# Patient Record
Sex: Male | Born: 1935 | Race: White | Hispanic: No | Smoking: Former smoker
Health system: Southern US, Community
[De-identification: ages and names within clinical notes are randomized; demographics above are authoritative.]

## PROBLEM LIST (undated history)

## (undated) DIAGNOSIS — I10 Essential (primary) hypertension: Secondary | ICD-10-CM

## (undated) DIAGNOSIS — I4891 Unspecified atrial fibrillation: Secondary | ICD-10-CM

## (undated) DIAGNOSIS — I5022 Chronic systolic (congestive) heart failure: Secondary | ICD-10-CM

## (undated) DIAGNOSIS — C760 Malignant neoplasm of head, face and neck: Secondary | ICD-10-CM

## (undated) DIAGNOSIS — E785 Hyperlipidemia, unspecified: Secondary | ICD-10-CM

## (undated) DIAGNOSIS — I219 Acute myocardial infarction, unspecified: Secondary | ICD-10-CM

## (undated) DIAGNOSIS — R001 Bradycardia, unspecified: Secondary | ICD-10-CM

## (undated) HISTORY — PX: BACK SURGERY: SHX140

---

## 2007-04-12 ENCOUNTER — Other Ambulatory Visit: Payer: Self-pay

## 2007-04-13 ENCOUNTER — Inpatient Hospital Stay: Payer: Self-pay | Admitting: Internal Medicine

## 2007-08-01 ENCOUNTER — Ambulatory Visit: Payer: Self-pay | Admitting: Internal Medicine

## 2007-08-12 ENCOUNTER — Ambulatory Visit: Payer: Self-pay | Admitting: Internal Medicine

## 2007-08-31 ENCOUNTER — Ambulatory Visit: Payer: Self-pay | Admitting: Internal Medicine

## 2007-10-01 ENCOUNTER — Ambulatory Visit: Payer: Self-pay | Admitting: Internal Medicine

## 2007-11-01 ENCOUNTER — Ambulatory Visit: Payer: Self-pay | Admitting: Internal Medicine

## 2008-02-13 ENCOUNTER — Emergency Department: Payer: Self-pay | Admitting: Emergency Medicine

## 2008-02-13 ENCOUNTER — Other Ambulatory Visit: Payer: Self-pay

## 2008-05-03 ENCOUNTER — Other Ambulatory Visit: Payer: Self-pay

## 2008-05-04 ENCOUNTER — Inpatient Hospital Stay: Payer: Self-pay | Admitting: Psychiatry

## 2008-05-06 ENCOUNTER — Ambulatory Visit: Payer: Self-pay | Admitting: Unknown Physician Specialty

## 2008-05-22 ENCOUNTER — Inpatient Hospital Stay: Payer: Self-pay | Admitting: Unknown Physician Specialty

## 2008-05-22 ENCOUNTER — Other Ambulatory Visit: Payer: Self-pay

## 2008-05-31 ENCOUNTER — Ambulatory Visit: Payer: Self-pay | Admitting: Unknown Physician Specialty

## 2008-10-13 ENCOUNTER — Ambulatory Visit: Payer: Self-pay | Admitting: Internal Medicine

## 2009-03-27 ENCOUNTER — Emergency Department: Payer: Self-pay | Admitting: Emergency Medicine

## 2010-02-25 ENCOUNTER — Emergency Department: Payer: Self-pay | Admitting: Emergency Medicine

## 2014-02-24 DIAGNOSIS — J449 Chronic obstructive pulmonary disease, unspecified: Secondary | ICD-10-CM | POA: Insufficient documentation

## 2014-02-24 DIAGNOSIS — F0390 Unspecified dementia without behavioral disturbance: Secondary | ICD-10-CM | POA: Insufficient documentation

## 2014-02-24 DIAGNOSIS — G629 Polyneuropathy, unspecified: Secondary | ICD-10-CM | POA: Insufficient documentation

## 2014-02-24 DIAGNOSIS — E785 Hyperlipidemia, unspecified: Secondary | ICD-10-CM | POA: Insufficient documentation

## 2014-02-24 DIAGNOSIS — I10 Essential (primary) hypertension: Secondary | ICD-10-CM | POA: Insufficient documentation

## 2014-02-24 DIAGNOSIS — I429 Cardiomyopathy, unspecified: Secondary | ICD-10-CM | POA: Insufficient documentation

## 2015-02-19 ENCOUNTER — Inpatient Hospital Stay (HOSPITAL_COMMUNITY): Payer: Medicare PPO | Admitting: Anesthesiology

## 2015-02-19 ENCOUNTER — Encounter (HOSPITAL_COMMUNITY)
Admission: EM | Disposition: A | Discharge status: Rehabilitation Facility | Payer: Self-pay | Source: Home / Self Care | Attending: Neurology

## 2015-02-19 ENCOUNTER — Emergency Department (HOSPITAL_COMMUNITY): Payer: Medicare PPO

## 2015-02-19 ENCOUNTER — Encounter (HOSPITAL_COMMUNITY): Payer: Self-pay | Admitting: *Deleted

## 2015-02-19 ENCOUNTER — Inpatient Hospital Stay (HOSPITAL_COMMUNITY)
Admission: EM | Admit: 2015-02-19 | Discharge: 2015-02-24 | DRG: 023 | Disposition: A | Discharge status: Rehabilitation Facility | Payer: Medicare PPO | Attending: Neurology | Admitting: Neurology

## 2015-02-19 ENCOUNTER — Inpatient Hospital Stay (HOSPITAL_COMMUNITY): Payer: Medicare PPO

## 2015-02-19 DIAGNOSIS — R29810 Facial weakness: Secondary | ICD-10-CM | POA: Diagnosis present

## 2015-02-19 DIAGNOSIS — I63411 Cerebral infarction due to embolism of right middle cerebral artery: Secondary | ICD-10-CM | POA: Diagnosis present

## 2015-02-19 DIAGNOSIS — I10 Essential (primary) hypertension: Secondary | ICD-10-CM | POA: Diagnosis not present

## 2015-02-19 DIAGNOSIS — I639 Cerebral infarction, unspecified: Secondary | ICD-10-CM | POA: Insufficient documentation

## 2015-02-19 DIAGNOSIS — Z87891 Personal history of nicotine dependence: Secondary | ICD-10-CM

## 2015-02-19 DIAGNOSIS — R414 Neurologic neglect syndrome: Secondary | ICD-10-CM | POA: Diagnosis present

## 2015-02-19 DIAGNOSIS — I5022 Chronic systolic (congestive) heart failure: Secondary | ICD-10-CM | POA: Diagnosis present

## 2015-02-19 DIAGNOSIS — R4701 Aphasia: Secondary | ICD-10-CM | POA: Diagnosis not present

## 2015-02-19 DIAGNOSIS — I255 Ischemic cardiomyopathy: Secondary | ICD-10-CM | POA: Diagnosis not present

## 2015-02-19 DIAGNOSIS — I25119 Atherosclerotic heart disease of native coronary artery with unspecified angina pectoris: Secondary | ICD-10-CM | POA: Diagnosis not present

## 2015-02-19 DIAGNOSIS — I429 Cardiomyopathy, unspecified: Secondary | ICD-10-CM | POA: Diagnosis present

## 2015-02-19 DIAGNOSIS — R471 Dysarthria and anarthria: Secondary | ICD-10-CM | POA: Diagnosis present

## 2015-02-19 DIAGNOSIS — Z8249 Family history of ischemic heart disease and other diseases of the circulatory system: Secondary | ICD-10-CM

## 2015-02-19 DIAGNOSIS — R05 Cough: Secondary | ICD-10-CM

## 2015-02-19 DIAGNOSIS — Z79899 Other long term (current) drug therapy: Secondary | ICD-10-CM

## 2015-02-19 DIAGNOSIS — I2510 Atherosclerotic heart disease of native coronary artery without angina pectoris: Secondary | ICD-10-CM | POA: Diagnosis present

## 2015-02-19 DIAGNOSIS — H53462 Homonymous bilateral field defects, left side: Secondary | ICD-10-CM | POA: Diagnosis present

## 2015-02-19 DIAGNOSIS — I480 Paroxysmal atrial fibrillation: Secondary | ICD-10-CM | POA: Diagnosis not present

## 2015-02-19 DIAGNOSIS — J9600 Acute respiratory failure, unspecified whether with hypoxia or hypercapnia: Secondary | ICD-10-CM | POA: Diagnosis not present

## 2015-02-19 DIAGNOSIS — I442 Atrioventricular block, complete: Secondary | ICD-10-CM | POA: Diagnosis present

## 2015-02-19 DIAGNOSIS — I119 Hypertensive heart disease without heart failure: Secondary | ICD-10-CM | POA: Diagnosis not present

## 2015-02-19 DIAGNOSIS — Z7982 Long term (current) use of aspirin: Secondary | ICD-10-CM | POA: Diagnosis not present

## 2015-02-19 DIAGNOSIS — Z950 Presence of cardiac pacemaker: Secondary | ICD-10-CM | POA: Diagnosis not present

## 2015-02-19 DIAGNOSIS — Q251 Coarctation of aorta: Secondary | ICD-10-CM

## 2015-02-19 DIAGNOSIS — K219 Gastro-esophageal reflux disease without esophagitis: Secondary | ICD-10-CM | POA: Diagnosis not present

## 2015-02-19 DIAGNOSIS — R4189 Other symptoms and signs involving cognitive functions and awareness: Secondary | ICD-10-CM | POA: Diagnosis not present

## 2015-02-19 DIAGNOSIS — R001 Bradycardia, unspecified: Secondary | ICD-10-CM | POA: Diagnosis not present

## 2015-02-19 DIAGNOSIS — G8194 Hemiplegia, unspecified affecting left nondominant side: Secondary | ICD-10-CM

## 2015-02-19 DIAGNOSIS — Z955 Presence of coronary angioplasty implant and graft: Secondary | ICD-10-CM

## 2015-02-19 DIAGNOSIS — Z23 Encounter for immunization: Secondary | ICD-10-CM

## 2015-02-19 DIAGNOSIS — I6340 Cerebral infarction due to embolism of unspecified cerebral artery: Secondary | ICD-10-CM | POA: Diagnosis present

## 2015-02-19 DIAGNOSIS — I252 Old myocardial infarction: Secondary | ICD-10-CM | POA: Diagnosis not present

## 2015-02-19 DIAGNOSIS — R4185 Anosognosia: Secondary | ICD-10-CM

## 2015-02-19 DIAGNOSIS — R059 Cough, unspecified: Secondary | ICD-10-CM

## 2015-02-19 DIAGNOSIS — R339 Retention of urine, unspecified: Secondary | ICD-10-CM | POA: Diagnosis not present

## 2015-02-19 DIAGNOSIS — I63511 Cerebral infarction due to unspecified occlusion or stenosis of right middle cerebral artery: Secondary | ICD-10-CM | POA: Diagnosis present

## 2015-02-19 DIAGNOSIS — I69354 Hemiplegia and hemiparesis following cerebral infarction affecting left non-dominant side: Secondary | ICD-10-CM | POA: Diagnosis not present

## 2015-02-19 DIAGNOSIS — R338 Other retention of urine: Secondary | ICD-10-CM | POA: Diagnosis not present

## 2015-02-19 DIAGNOSIS — D72829 Elevated white blood cell count, unspecified: Secondary | ICD-10-CM | POA: Diagnosis present

## 2015-02-19 DIAGNOSIS — I5020 Unspecified systolic (congestive) heart failure: Secondary | ICD-10-CM | POA: Diagnosis not present

## 2015-02-19 DIAGNOSIS — I4891 Unspecified atrial fibrillation: Secondary | ICD-10-CM | POA: Diagnosis not present

## 2015-02-19 DIAGNOSIS — E785 Hyperlipidemia, unspecified: Secondary | ICD-10-CM | POA: Diagnosis present

## 2015-02-19 DIAGNOSIS — N400 Enlarged prostate without lower urinary tract symptoms: Secondary | ICD-10-CM | POA: Diagnosis not present

## 2015-02-19 DIAGNOSIS — I6789 Other cerebrovascular disease: Secondary | ICD-10-CM | POA: Diagnosis not present

## 2015-02-19 DIAGNOSIS — G8190 Hemiplegia, unspecified affecting unspecified side: Secondary | ICD-10-CM | POA: Diagnosis not present

## 2015-02-19 HISTORY — DX: Malignant neoplasm of head, face and neck: C76.0

## 2015-02-19 HISTORY — DX: Essential (primary) hypertension: I10

## 2015-02-19 HISTORY — DX: Unspecified atrial fibrillation: I48.91

## 2015-02-19 HISTORY — DX: Chronic systolic (congestive) heart failure: I50.22

## 2015-02-19 HISTORY — DX: Bradycardia, unspecified: R00.1

## 2015-02-19 HISTORY — DX: Acute myocardial infarction, unspecified: I21.9

## 2015-02-19 HISTORY — DX: Hyperlipidemia, unspecified: E78.5

## 2015-02-19 HISTORY — PX: RADIOLOGY WITH ANESTHESIA: SHX6223

## 2015-02-19 LAB — I-STAT CHEM 8, ED
BUN: 16 mg/dL (ref 6–20)
CREATININE: 1.1 mg/dL (ref 0.61–1.24)
Calcium, Ion: 1.09 mmol/L — ABNORMAL LOW (ref 1.13–1.30)
Chloride: 104 mmol/L (ref 101–111)
GLUCOSE: 106 mg/dL — AB (ref 65–99)
HEMATOCRIT: 55 % — AB (ref 39.0–52.0)
HEMOGLOBIN: 18.7 g/dL — AB (ref 13.0–17.0)
Potassium: 3.9 mmol/L (ref 3.5–5.1)
Sodium: 141 mmol/L (ref 135–145)
TCO2: 23 mmol/L (ref 0–100)

## 2015-02-19 LAB — PROTIME-INR
INR: 1.21 (ref 0.00–1.49)
PROTHROMBIN TIME: 15.5 s — AB (ref 11.6–15.2)

## 2015-02-19 LAB — CBC
HEMATOCRIT: 51.3 % (ref 39.0–52.0)
HEMOGLOBIN: 17.5 g/dL — AB (ref 13.0–17.0)
MCH: 31.4 pg (ref 26.0–34.0)
MCHC: 34.1 g/dL (ref 30.0–36.0)
MCV: 92.1 fL (ref 78.0–100.0)
Platelets: 159 10*3/uL (ref 150–400)
RBC: 5.57 MIL/uL (ref 4.22–5.81)
RDW: 13.9 % (ref 11.5–15.5)
WBC: 6.4 10*3/uL (ref 4.0–10.5)

## 2015-02-19 LAB — COMPREHENSIVE METABOLIC PANEL
ALBUMIN: 3.8 g/dL (ref 3.5–5.0)
ALK PHOS: 97 U/L (ref 38–126)
ALT: 14 U/L — ABNORMAL LOW (ref 17–63)
AST: 23 U/L (ref 15–41)
Anion gap: 10 (ref 5–15)
BUN: 13 mg/dL (ref 6–20)
CALCIUM: 9.3 mg/dL (ref 8.9–10.3)
CHLORIDE: 104 mmol/L (ref 101–111)
CO2: 26 mmol/L (ref 22–32)
Creatinine, Ser: 1.12 mg/dL (ref 0.61–1.24)
GFR calc Af Amer: >60 mL/min (ref >60)
GFR, EST NON AFRICAN AMERICAN: 58 mL/min — AB (ref >60)
GLUCOSE: 105 mg/dL — AB (ref 65–99)
POTASSIUM: 4 mmol/L (ref 3.5–5.1)
Sodium: 140 mmol/L (ref 135–145)
Total Bilirubin: 1.1 mg/dL (ref 0.3–1.2)
Total Protein: 6.9 g/dL (ref 6.5–8.1)

## 2015-02-19 LAB — DIFFERENTIAL
BASOS ABS: 0 10*3/uL (ref 0.0–0.1)
Basophils Relative: 1 % (ref 0–1)
EOS PCT: 2 % (ref 0–5)
Eosinophils Absolute: 0.1 10*3/uL (ref 0.0–0.7)
LYMPHS ABS: 2.3 10*3/uL (ref 0.7–4.0)
LYMPHS PCT: 36 % (ref 12–46)
Monocytes Absolute: 0.6 10*3/uL (ref 0.1–1.0)
Monocytes Relative: 10 % (ref 3–12)
NEUTROS ABS: 3.3 10*3/uL (ref 1.7–7.7)
Neutrophils Relative %: 51 % (ref 43–77)

## 2015-02-19 LAB — I-STAT TROPONIN, ED: TROPONIN I, POC: 0.01 ng/mL (ref 0.00–0.08)

## 2015-02-19 LAB — APTT: aPTT: 34 seconds (ref 24–37)

## 2015-02-19 LAB — ETHANOL: Alcohol, Ethyl (B): <5 mg/dL (ref <5)

## 2015-02-19 SURGERY — RADIOLOGY WITH ANESTHESIA
Anesthesia: General

## 2015-02-19 MED ORDER — IOHEXOL 300 MG/ML  SOLN
250.0000 mL | Freq: Once | INTRAMUSCULAR | Status: AC | PRN
  Administered 2015-02-19: 80 mL via INTRAVENOUS

## 2015-02-19 MED ORDER — NICARDIPINE HCL IN NACL 20-0.86 MG/200ML-% IV SOLN
5.0000 mg/h | INTRAVENOUS | Status: DC
  Administered 2015-02-19: 7.5 mg/h via INTRAVENOUS
  Administered 2015-02-20: 8 mg/h via INTRAVENOUS
  Administered 2015-02-20: 7 mg/h via INTRAVENOUS
  Administered 2015-02-20: 8 mg/h via INTRAVENOUS
  Filled 2015-02-19: qty 200
  Filled 2015-02-19: qty 400
  Filled 2015-02-19: qty 200

## 2015-02-19 MED ORDER — ACETAMINOPHEN 325 MG PO TABS
650.0000 mg | ORAL_TABLET | ORAL | Status: DC | PRN
  Administered 2015-02-21 – 2015-02-22 (×2): 650 mg via ORAL
  Filled 2015-02-19 (×2): qty 2

## 2015-02-19 MED ORDER — SODIUM CHLORIDE 0.9 % IV SOLN
INTRAVENOUS | Status: DC
  Administered 2015-02-20 – 2015-02-21 (×3): via INTRAVENOUS

## 2015-02-19 MED ORDER — ACETAMINOPHEN 500 MG PO TABS: 1000.0000 mg | ORAL_TABLET | Freq: Four times a day (QID) | ORAL | Status: DC | PRN

## 2015-02-19 MED ORDER — SUCCINYLCHOLINE CHLORIDE 20 MG/ML IJ SOLN
INTRAMUSCULAR | Status: DC | PRN
  Administered 2015-02-19: 120 mg via INTRAVENOUS

## 2015-02-19 MED ORDER — EPHEDRINE SULFATE 50 MG/ML IJ SOLN
INTRAMUSCULAR | Status: DC | PRN
  Administered 2015-02-19: 10 mg via INTRAVENOUS
  Administered 2015-02-19 (×2): 5 mg via INTRAVENOUS

## 2015-02-19 MED ORDER — PROPOFOL INFUSION 10 MG/ML OPTIME
INTRAVENOUS | Status: DC | PRN
  Administered 2015-02-19: 25 ug/kg/min via INTRAVENOUS

## 2015-02-19 MED ORDER — SODIUM CHLORIDE 0.9 % IV SOLN
INTRAVENOUS | Status: DC
  Administered 2015-02-19: 19:00:00 via INTRAVENOUS

## 2015-02-19 MED ORDER — CEFAZOLIN SODIUM-DEXTROSE 2-3 GM-% IV SOLR
INTRAVENOUS | Status: AC
Start: 2015-02-19 — End: 2015-02-20
  Filled 2015-02-19: qty 50

## 2015-02-19 MED ORDER — ROCURONIUM BROMIDE 100 MG/10ML IV SOLN
INTRAVENOUS | Status: DC | PRN
  Administered 2015-02-19: 20 mg via INTRAVENOUS
  Administered 2015-02-19: 30 mg via INTRAVENOUS

## 2015-02-19 MED ORDER — PANTOPRAZOLE SODIUM 40 MG IV SOLR
40.0000 mg | Freq: Every day | INTRAVENOUS | Status: DC
  Administered 2015-02-20 (×2): 40 mg via INTRAVENOUS
  Filled 2015-02-19 (×4): qty 40

## 2015-02-19 MED ORDER — ONDANSETRON HCL 4 MG/2ML IJ SOLN
4.0000 mg | Freq: Four times a day (QID) | INTRAMUSCULAR | Status: DC | PRN
  Administered 2015-02-22: 4 mg via INTRAVENOUS
  Filled 2015-02-19: qty 2

## 2015-02-19 MED ORDER — ACETAMINOPHEN 650 MG RE SUPP: 650.0000 mg | Freq: Four times a day (QID) | RECTAL | Status: DC | PRN

## 2015-02-19 MED ORDER — ACETAMINOPHEN 650 MG RE SUPP: 650.0000 mg | RECTAL | Status: DC | PRN

## 2015-02-19 MED ORDER — STROKE: EARLY STAGES OF RECOVERY BOOK
Freq: Once | Status: AC
  Administered 2015-02-24: 13:00:00
  Filled 2015-02-19: qty 1

## 2015-02-19 MED ORDER — FENTANYL CITRATE (PF) 100 MCG/2ML IJ SOLN
INTRAMUSCULAR | Status: DC | PRN
  Administered 2015-02-19: 100 ug via INTRAVENOUS
  Administered 2015-02-19: 150 ug via INTRAVENOUS

## 2015-02-19 MED ORDER — MIDAZOLAM HCL 2 MG/2ML IJ SOLN
INTRAMUSCULAR | Status: AC
  Filled 2015-02-19: qty 2

## 2015-02-19 MED ORDER — PROPOFOL 10 MG/ML IV BOLUS
INTRAVENOUS | Status: DC | PRN
  Administered 2015-02-19: 100 mg via INTRAVENOUS

## 2015-02-19 MED ORDER — SENNOSIDES-DOCUSATE SODIUM 8.6-50 MG PO TABS
1.0000 | ORAL_TABLET | Freq: Every evening | ORAL | Status: DC | PRN
  Filled 2015-02-19: qty 1

## 2015-02-19 MED ORDER — LACTATED RINGERS IV SOLN
INTRAVENOUS | Status: DC | PRN
  Administered 2015-02-19: 21:00:00 via INTRAVENOUS

## 2015-02-19 MED ORDER — CEFAZOLIN SODIUM-DEXTROSE 2-3 GM-% IV SOLR
INTRAVENOUS | Status: DC | PRN
  Administered 2015-02-19: 2 g via INTRAVENOUS

## 2015-02-19 MED ORDER — FENTANYL CITRATE (PF) 250 MCG/5ML IJ SOLN
INTRAMUSCULAR | Status: AC
  Filled 2015-02-19: qty 5

## 2015-02-19 MED ORDER — LIDOCAINE HCL (CARDIAC) 20 MG/ML IV SOLN
INTRAVENOUS | Status: DC | PRN
  Administered 2015-02-19: 30 mg via INTRAVENOUS

## 2015-02-19 MED ORDER — ALTEPLASE 30 MG/30 ML FOR INTERV. RAD
1.0000 mg | INTRA_ARTERIAL | Status: AC | PRN
  Administered 2015-02-19: 6 mg via INTRA_ARTERIAL
  Filled 2015-02-19: qty 30

## 2015-02-19 MED ORDER — NITROGLYCERIN 1 MG/10 ML FOR IR/CATH LAB
INTRA_ARTERIAL | Status: AC
  Filled 2015-02-19: qty 10

## 2015-02-19 MED ORDER — ALTEPLASE (STROKE) FULL DOSE INFUSION
0.9000 mg/kg | Freq: Once | INTRAVENOUS | Status: AC
  Administered 2015-02-19: 75 mg via INTRAVENOUS
  Filled 2015-02-19: qty 75

## 2015-02-19 MED ORDER — LABETALOL HCL 5 MG/ML IV SOLN
INTRAVENOUS | Status: AC
  Filled 2015-02-19: qty 4

## 2015-02-19 MED ORDER — LABETALOL HCL 5 MG/ML IV SOLN
10.0000 mg | INTRAVENOUS | Status: DC | PRN
  Administered 2015-02-19: 10 mg via INTRAVENOUS

## 2015-02-19 NOTE — Anesthesia Preprocedure Evaluation (Addendum)
Anesthesia Evaluation  Patient identified by MRN, date of birth, ID band Patient awake  General Assessment Comment:dysphasic  Reviewed: Allergy & Precautions, NPO status , Patient's Chart, lab work & pertinent test results, Unable to perform ROS - Chart review onlyPreop documentation limited or incomplete due to emergent nature of procedure.  History of Anesthesia Complications Negative for: history of anesthetic complications  Airway Mallampati: II  TM Distance: >3 FB Neck ROM: Full    Dental  (+) Edentulous Upper, Edentulous Lower   Pulmonary COPDformer smoker,  breath sounds clear to auscultation        Cardiovascular hypertension, Pt. on medications - angina+ CAD, + Past MI and + Cardiac Stents + dysrhythmias (new onset Afib? on EKG tonight) Atrial Fibrillation Rhythm:Irregular Rate:Normal     Neuro/Psych Acute stroke: R gaze, L hemiparesis, dysphasia CVA, Residual Symptoms    GI/Hepatic Neg liver ROS, GERD-  Medicated and Controlled,  Endo/Other  negative endocrine ROS  Renal/GU negative Renal ROS     Musculoskeletal   Abdominal   Peds  Hematology   Anesthesia Other Findings   Reproductive/Obstetrics                           Anesthesia Physical Anesthesia Plan  ASA: III and emergent  Anesthesia Plan: General   Post-op Pain Management:    Induction: Intravenous, Rapid sequence and Cricoid pressure planned  Airway Management Planned: Oral ETT  Additional Equipment: Arterial line  Intra-op Plan:   Post-operative Plan: Post-operative intubation/ventilation  Informed Consent: I have reviewed the patients History and Physical, chart, labs and discussed the procedure including the risks, benefits and alternatives for the proposed anesthesia with the patient or authorized representative who has indicated his/her understanding and acceptance.   Consent reviewed with POA, Only  emergency history available and History available from chart only  Plan Discussed with: CRNA and Surgeon  Anesthesia Plan Comments: (Plan routine monitors, A line, GETA with post op analgesia)        Anesthesia Quick Evaluation

## 2015-02-19 NOTE — Consult Note (Addendum)
Name: Parker [name] MRN: [id number] DOB: 1927/03/09    ADMISSION DATE:  02/19/2015 CONSULTATION DATE:  02/19/15  REFERRING MD :  Dr. Leonie Man  Reason for consult: vent management. Bp management.    SIGNIFICANT EVENTS  CVA s/p tPA followed by complete revascularization of occluded RT MCA M1 seg using x1 pass with the the Solitaire FR 42mmx 52mm stent retrieval device and 6 mg of superselective IA TPA   STUDIES:  CT head, cerebral angiogram  HISTORY OF PRESENT ILLNESS:  81yo CM w/pmhx of HTN, HLD, CAD presented w/a MCA distribution infarct. Pt at that time had a right gaze preference and left sided paresis, per report. At home he had left arm weakness, facial droop and slurred speech. He received tPA and then endovascular treatment which consisted of complete revascularization of occluded RT MCA M1 seg using x1 pass with the the Solitaire FR 50mmx 44mm stent retrieval device and 6 mg of superselective IA TPA  Pt was intubated by anesthesia for the procedure which was done with general anesthesia.   PAST MEDICAL HISTORY :   has a past medical history of MI (myocardial infarction); Hypertension; and Hyperlipidemia.  has past surgical history that includes Back surgery. Prior to Admission medications   Medication Sig Start Date End Date Taking? Authorizing Provider  aspirin EC 81 MG tablet Take 81 mg by mouth daily.   Yes Historical Provider, MD  lisinopril (PRINIVIL,ZESTRIL) 20 MG tablet Take 20 mg by mouth at bedtime.  06/28/14  Yes Historical Provider, MD  omeprazole (PRILOSEC) 20 MG capsule Take 20 mg by mouth daily. 10/19/14  Yes Historical Provider, MD  simvastatin (ZOCOR) 20 MG tablet Take 20 mg by mouth at bedtime. 10/19/14  Yes Historical Provider, MD   No Known Allergies  FAMILY HISTORY:  family history is not on file. SOCIAL HISTORY:  reports that he has quit smoking. He does not have any smokeless tobacco history on file. He reports that he does not drink alcohol or use illicit  drugs.  REVIEW OF SYSTEMS:   Unable to obtain as the patient is intubated and still partially under the effects of the sedation.  SUBJECTIVE:   VITAL SIGNS: Temp:  [97.2 F (36.2 C)] 97.2 F (36.2 C) (05/22 1925) Pulse Rate:  [43-76] 64 (05/22 2030) Resp:  [13-25] 19 (05/22 2030) BP: (143-190)/(80-120) 169/85 mmHg (05/22 2030) SpO2:  [94 %-100 %] 98 % (05/22 2030) FiO2 (%):  [60 %] 60 % (05/22 2204) Weight:  [182 lb 12.2 oz (82.901 kg)] 182 lb 12.2 oz (82.901 kg) (05/22 1920)  PHYSICAL EXAMINATION: General:  81 year old caucasian male who appears his state age. In no acute distress. He is alert/awake. Seems to be oriented recognizing his daugher at the bedside. well nourished well developed Neuro:  Cranial nerves II-XII unable to assess but he did not appear to have a frank facial droop. PERRL. Pt cannot cross midline to the left. Right gaze preference. Sensation and strength 3/5 LUE 5/5 RUE although strength could have been limited by propofol. Cerebellum testing not performed. Gait was not assessed due to critical illness. Pt able to follow two step commands with both of his upper extremities. HEENT: Head, normocephalic, atraumatic. Ears symmetric, permeable. Eyes: no conjunctival icterus, no erythema. Nose: permeable, midline septum . Clear oropharynx  ETT present  Cardiovascular: S1S2 pt is bradycardic in the 40's. no murmurs, rubs, or gallops auscultated. No thrills palpated.  Lungs:  Chest symmetrical with respirations, clear to auscultation bilaterally with no  wheezing, crackles, equal expansion.  Abdomen:  Soft, nontender, no guarding, nondistended. Present bowel sounds. Musculoskeletal:  No muscle atrophy noted. Gait was not assessed due to critical illness. No swelling nor tenderness of the joints.  Skin:  No notable scars, rashes, cruises. No bed sores noted. Pt has a bandage on groin (right) from where procedure was performed. Lymphatics: no palpable lymphadenopathy of  cervical, supraclvicular nor inguinal areas.      Recent Labs Lab 02/19/15 1830 02/19/15 1842  NA 140 141  K 4.0 3.9  CL 104 104  CO2 26  --   BUN 13 16  CREATININE 1.12 1.10  GLUCOSE 105* 106*    Recent Labs Lab 02/19/15 1830 02/19/15 1842  HGB 17.5* 18.7*  HCT 51.3 55.0*  WBC 6.4  --   PLT 159  --    Ct Head Wo Contrast  02/19/2015   CLINICAL DATA:  Slurred speech, LEFT sided weakness, RIGHT gaze.  EXAM: CT HEAD WITHOUT CONTRAST  TECHNIQUE: Contiguous axial images were obtained from the base of the skull through the vertex without intravenous contrast.  COMPARISON:  None.  FINDINGS: Mild blurring of the RIGHT posterior insula gray-white matter differentiation. No intraparenchymal hemorrhage, mass effect, midline shift. Ventricles and sulci are normal for patient's age, cavum septum pellucidum is a normal variant. Minimal white matter changes suggest chronic small vessel ischemic disease, less than expected for age.  No abnormal extra-axial fluid collections. Very mild suspected dense RIGHT middle cerebral artery, though there is a component of hemoconcentration as the basilar artery also dense. Basal cisterns are patent.  Ocular globes and orbital contents are unremarkable. Chronic RIGHT sphenoid sinusitis with small LEFT sphenoid air-fluid level. The mastoid air cells are well aerated. No skull fracture.  IMPRESSION: Findings concerning for early RIGHT middle cerebral artery territory infarct which could be confirmed on MRI of the brain with diffusion-weighted sequences as clinically indicated. There is a component of probable hemoconcentration.  Otherwise normal noncontrast CT of the head for age.  Acute findings discussed with and reconfirmed by Dr.KEVIN STEINL on 02/19/2015 at 6:52 pm.   Electronically Signed   By: Elon Alas   On: 02/19/2015 18:55    ASSESSMENT  1. Acute R MCA CVA s/p complete revascularization of occluded RT MCA M1 seg using x1 pass with the the  Solitaire FR 59mmx 66mm stent retrieval device and 6 mg of superselective IA TPA  2. Acute respiratory failure due to airway protection, on minimal settings.  3. Hypertension on nicardipine drip 4. H/o GERD on PPI  PLAN  1. ASA per neurology/statin 2. Vent management. SBT in AM. Potential extubation. 3. Continue nicardipine drip. Changed PRN labetalol to hydralazine due to bradycardia. Likely start PO regimen in the AM.  4. Attempt analgosedation with fentanyl, low dose.     Critical Care time spent discussing care with family, ventilator management, and drips for hypertension and sedation: 33 minutes.  Randa Lynn, MD Critical Care Medicine [facility] Pager: [phone]  02/19/2015, 11:57 PM

## 2015-02-19 NOTE — Code Documentation (Signed)
81 year old male presents to [facility] via [place] as code stroke.  Was at home in normal state of health at 1730 - had just finished eating dinner.  His friend went to the BR at 1730 and when she returned to the room he was found with left side hemiparesis - difficulty speech - facial droop.  LSW 1730.  Code stroke was called in the field at 1801.  On arrival he was lethargic but arouses easily - left arm flacid, forced gaze to the right, left leg cannot resist gravity but can move, severe dysarthria, and complete left field cut, left total sensation loss and some neglect.  NIHHS 19.  CT scan done.  BP elevated 190/120  HR 73 - Labetalol 10 mg IV given.  BP down to 172/90 HR 44.  O2 sats 97% - Dr. Aram Beecham aware.  Order to do tPA.  tPA started at [place].  Patient remains on CT table in prep for CTA.  Patient became cool and clammy and less responsive - fingertips blue - BP 154/78 HR 43 - RR 10 - placed back on bed and transferred back to ED - Dr. Aram Beecham present in room.  BP 148/63 HR 48 - RR 18 O2 sats 95%.  NS bolus started.  Patient with little tracheal tug - Dr. Ashok Cordia and Dr. Aram Beecham to room - [facility] elevated to 30 degrees.  Patient more alert - color more pink - better airway control.  Family present = Dr. Aram Beecham talking with updates.  BP 158/78 HR 58  - patient more alert - talking more - speech little more clear - still with tongue deviation.  Fair cough.  Handoff to [employer].

## 2015-02-19 NOTE — Transfer of Care (Signed)
Immediate Anesthesia Transfer of Care Note  Patient: Parker [name]  Procedure(s) Performed: Procedure(s): RADIOLOGY WITH ANESTHESIA (N/A)  Patient Location: SICU  Anesthesia Type:General  Level of Consciousness: Patient remains intubated per anesthesia plan  Airway & Oxygen Therapy: Patient remains intubated per anesthesia plan and Patient placed on Ventilator (see vital sign flow sheet for setting)  Post-op Assessment: Report given to RN and Post -op Vital signs reviewed and stable  Post vital signs: Reviewed and stable  Last Vitals:  Filed Vitals:   02/19/15 2030  BP: 169/85  Pulse: 64  Temp:   Resp: 19    Complications: No apparent anesthesia complications

## 2015-02-19 NOTE — Anesthesia Procedure Notes (Signed)
Procedure Name: Intubation Date/Time: 02/19/2015 8:48 PM Performed by: Mosie Epstein Pre-anesthesia Checklist: Patient identified, Emergency Drugs available, Suction available, Patient being monitored and Timeout performed Patient Re-evaluated:Patient Re-evaluated prior to inductionOxygen Delivery Method: Circle system utilized Preoxygenation: Pre-oxygenation with 100% oxygen Intubation Type: IV induction, Rapid sequence and Cricoid Pressure applied Ventilation: Mask ventilation without difficulty Laryngoscope Size: Mac and 4 Grade View: Grade I Tube type: Subglottic suction tube Tube size: 7.5 mm Number of attempts: 1 Airway Equipment and Method: Stylet Placement Confirmation: ETT inserted through vocal cords under direct vision,  positive ETCO2 and breath sounds checked- equal and bilateral Secured at: 22 cm Tube secured with: Tape Dental Injury: Teeth and Oropharynx as per pre-operative assessment

## 2015-02-19 NOTE — H&P (Addendum)
Admission H&P    Chief Complaint:  Code stroke, left hemiparesis, decreased responsiveness, right gaze preference  HPI: Parker [name] is an 81 y.o. male with a past medical history significant for CAD s/p stenting, brought in as a code stroke due to acute onset of the above stated symptoms. Patient just finished eating dinner, was talking to a friend sitting in a chair when suddenly became less responsive, drooling, leaning to the left, no able to speak fluently. NIHSS 19. CT brain was personally reviewed and showed mild blurring of the RIGHT posterior insula gray-white matter differentiation concerning for an early right MCA infarct. He was given 10 mg IV labetalol before tpa due to elevated BP, but became bradycardic with BP 136. Patient's family stated that by his wishes he won't like to be intubated. Family was initially reluctant about pursuing aggressive endovascular intervention but subsequently decided to pursue endovascular treatment. Interventional neuroradiologist was contacted. Presently, he is able to follow commands, very dysarthric.  Last seen normal: 02/19/15 at 530 pm Tpa given: yes  History reviewed. No pertinent past medical history.  No past surgical history on file.  No family history on file. Social History:  has no tobacco, alcohol, and drug history on file.  Allergies: Allergies not on file   (Not in a hospital admission)  ROS: unable to obtain due to mental status  Physical Examination: HEENT-  Normocephalic, no lesions, without obvious abnormality.  Normal external eye and conjunctiva.  Normal TM's bilaterally.  Normal auditory canals and external ears. Normal external nose, mucus membranes and septum.  Normal pharynx. Neck supple with no masses, nodes, nodules or enlargement. Cardiovascular - regular rate and rhythm, S1, S2 normal, no murmur, click, rub or gallop Lungs - chest clear, no wheezing, rales, normal symmetric air entry, Heart exam - S1, S2 normal,  no murmur, no gallop, rate regular Abdomen - soft, non-tender; bowel sounds normal; no masses,  no organomegaly Extremities - no edema  Skin: no rash  Neurologic Examination: General: Mental Status: Awake and oriented to year-month-place. Dysarthric without evidence of aphasia.  Able to follow 3 step commands without difficulty. Cranial Nerves: II: Discs flat bilaterally; Visual fields impaired in the right,  pupils equal, round, reactive to light and accommodation III,IV, VI: ptosis not present, extra-ocular motions intact bilaterally V,VII: smile asymmetric with left face weakness, facial light touch sensation normal bilaterally VIII: hearing normal bilaterally IX,X: uvula rises symmetrically XI: bilateral shoulder shrug no tested XII: tongue deviated to the left  Motor: Dense left hemiparesis Tone decreased in the left Sensory: Pinprick and light touch impaired in the left. Deep Tendon Reflexes:  2 all over  Plantars: Right: downgoing   Left: downgoing Cerebellar: normal finger-to-nose,  normal heel-to-shin test Gait: Unable to test CV: pulses palpable throughout    Results for orders placed or performed during the hospital encounter of 02/19/15 (from the past 48 hour(s))  Protime-INR     Status: Abnormal   Collection Time: 02/19/15  6:30 PM  Result Value Ref Range   Prothrombin Time 15.5 (H) 11.6 - 15.2 seconds   INR 1.21 0.00 - 1.49  APTT     Status: None   Collection Time: 02/19/15  6:30 PM  Result Value Ref Range   aPTT 34 24 - 37 seconds  CBC     Status: Abnormal   Collection Time: 02/19/15  6:30 PM  Result Value Ref Range   WBC 6.4 4.0 - 10.5 K/uL   RBC 5.57 4.22 - 5.81  MIL/uL   Hemoglobin 17.5 (H) 13.0 - 17.0 g/dL   HCT 51.3 39.0 - 52.0 %   MCV 92.1 78.0 - 100.0 fL   MCH 31.4 26.0 - 34.0 pg   MCHC 34.1 30.0 - 36.0 g/dL   RDW 13.9 11.5 - 15.5 %   Platelets 159 150 - 400 K/uL  Differential     Status: None   Collection Time: 02/19/15  6:30 PM  Result  Value Ref Range   Neutrophils Relative % 51 43 - 77 %   Neutro Abs 3.3 1.7 - 7.7 K/uL   Lymphocytes Relative 36 12 - 46 %   Lymphs Abs 2.3 0.7 - 4.0 K/uL   Monocytes Relative 10 3 - 12 %   Monocytes Absolute 0.6 0.1 - 1.0 K/uL   Eosinophils Relative 2 0 - 5 %   Eosinophils Absolute 0.1 0.0 - 0.7 K/uL   Basophils Relative 1 0 - 1 %   Basophils Absolute 0.0 0.0 - 0.1 K/uL  I-stat troponin, ED (not at [facility])     Status: None   Collection Time: 02/19/15  6:40 PM  Result Value Ref Range   Troponin i, poc 0.01 0.00 - 0.08 ng/mL   Comment 3            Comment: Due to the release kinetics of cTnI, a negative result within the first hours of the onset of symptoms does not rule out myocardial infarction with certainty. If myocardial infarction is still suspected, repeat the test at appropriate intervals.   I-Stat Chem 8, ED  (not at [facility])     Status: Abnormal   Collection Time: 02/19/15  6:42 PM  Result Value Ref Range   Sodium 141 135 - 145 mmol/L   Potassium 3.9 3.5 - 5.1 mmol/L   Chloride 104 101 - 111 mmol/L   BUN 16 6 - 20 mg/dL   Creatinine, Ser 1.10 0.61 - 1.24 mg/dL   Glucose, Bld 106 (H) 65 - 99 mg/dL   Calcium, Ion 1.09 (L) 1.13 - 1.30 mmol/L   TCO2 23 0 - 100 mmol/L   Hemoglobin 18.7 (H) 13.0 - 17.0 g/dL   HCT 55.0 (H) 39.0 - 52.0 %   Ct Head Wo Contrast  02/19/2015   CLINICAL DATA:  Slurred speech, LEFT sided weakness, RIGHT gaze.  EXAM: CT HEAD WITHOUT CONTRAST  TECHNIQUE: Contiguous axial images were obtained from the base of the skull through the vertex without intravenous contrast.  COMPARISON:  None.  FINDINGS: Mild blurring of the RIGHT posterior insula gray-white matter differentiation. No intraparenchymal hemorrhage, mass effect, midline shift. Ventricles and sulci are normal for patient's age, cavum septum pellucidum is a normal variant. Minimal white matter changes suggest chronic small vessel ischemic disease, less than expected for age.  No abnormal  extra-axial fluid collections. Very mild suspected dense RIGHT middle cerebral artery, though there is a component of hemoconcentration as the basilar artery also dense. Basal cisterns are patent.  Ocular globes and orbital contents are unremarkable. Chronic RIGHT sphenoid sinusitis with small LEFT sphenoid air-fluid level. The mastoid air cells are well aerated. No skull fracture.  IMPRESSION: Findings concerning for early RIGHT middle cerebral artery territory infarct which could be confirmed on MRI of the brain with diffusion-weighted sequences as clinically indicated. There is a component of probable hemoconcentration.  Otherwise normal noncontrast CT of the head for age.  Acute findings discussed with and reconfirmed by Dr.KEVIN STEINL on 02/19/2015 at 6:52 pm.   Electronically  Signed   By: Elon Alas   On: 02/19/2015 18:55    Assessment/Plan 81 y/o with a neurological syndrome consistent with a large right MCA distribution infarct. NIHSS 19. Patient was administered IV tpa. Family was initially reluctant about pursuing aggressive endovascular intervention but subsequently decided to pursue endovascular treatment. Interventional neuroradiologist was contacted. Admit to NICU and follow post IV tpa protocol. Stroke team will follow up in the morning.  Dorian Pod, MD [facility] 02/19/2015, 7:15 PM

## 2015-02-19 NOTE — ED Provider Notes (Signed)
CSN: [id number]     Arrival date & time 02/19/15  1826 History   None    Chief Complaint  Patient presents with  . Code Stroke     (Consider location/radiation/quality/duration/timing/severity/associated sxs/prior Treatment) HPI Comments: LEVEL 5 EXCEPTION 2/7 ACUITY  81 year old male brought to the emergency department via EMS with concerns for stroke. Patient last known normal one hour prior to arrival. Wife found him with left-sided paralysis and inability to speak. Slight improvement since EMS arrival. No further information available this time as patient is unable to communicate.  Patient is a 81 y.o. male presenting with Acute Neurological Problem.  Cerebrovascular Accident This is a new problem. The current episode started today. The problem has been gradually improving. Associated symptoms comments: L sided weakness and slurred speech. Nothing aggravates the symptoms. He has tried nothing for the symptoms. The treatment provided no relief.    Past Medical History  Diagnosis Date  . MI (myocardial infarction)   . Hypertension   . Hyperlipidemia    Past Surgical History  Procedure Laterality Date  . Back surgery     No family history on file. History  Substance Use Topics  . Smoking status: Former Research scientist (life sciences)  . Smokeless tobacco: Not on file  . Alcohol Use: No    Review of Systems  Unable to perform ROS: Acuity of condition      Allergies  Review of patient's allergies indicates no known allergies.  Home Medications   Prior to Admission medications   Medication Sig Start Date End Date Taking? Authorizing Provider  aspirin EC 81 MG tablet Take 81 mg by mouth daily.   Yes Historical Provider, MD  lisinopril (PRINIVIL,ZESTRIL) 20 MG tablet Take 20 mg by mouth at bedtime.  06/28/14  Yes Historical Provider, MD  omeprazole (PRILOSEC) 20 MG capsule Take 20 mg by mouth daily. 10/19/14  Yes Historical Provider, MD  simvastatin (ZOCOR) 20 MG tablet Take 20 mg by mouth at  bedtime. 10/19/14  Yes Historical Provider, MD   BP 169/85 mmHg  Pulse 64  Temp(Src) 97.2 F (36.2 C)  Resp 19  Ht 6\' 3"  (1.905 m)  Wt 182 lb 12.2 oz (82.901 kg)  BMI 22.84 kg/m2  SpO2 98% Physical Exam  Constitutional: He appears distressed.  HENT:  Head: Atraumatic.  Eyes:  eye deviation right.  Cardiovascular: Normal rate.   Pulmonary/Chest: Effort normal. No stridor. No respiratory distress.  Abdominal: Soft. He exhibits no distension.  Musculoskeletal: He exhibits no edema.  Neurological:  Left-sided 15 strength upper and lower extremity. Eyes are deviated to the right nontracking to left. Speech slurred. Remainder of exam technically difficult 2/2 acuity and pt inability to participate   Skin: Skin is warm.  Psychiatric:  Unable to assess   Vitals reviewed.   ED Course  Procedures (including critical care time) Labs Review Labs Reviewed  PROTIME-INR - Abnormal; Notable for the following:    Prothrombin Time 15.5 (*)    All other components within normal limits  CBC - Abnormal; Notable for the following:    Hemoglobin 17.5 (*)    All other components within normal limits  COMPREHENSIVE METABOLIC PANEL - Abnormal; Notable for the following:    Glucose, Bld 105 (*)    ALT 14 (*)    GFR calc non Af Amer 58 (*)    All other components within normal limits  I-STAT CHEM 8, ED - Abnormal; Notable for the following:    Glucose, Bld 106 (*)  Calcium, Ion 1.09 (*)    Hemoglobin 18.7 (*)    HCT 55.0 (*)    All other components within normal limits  ETHANOL  APTT  DIFFERENTIAL  URINE RAPID DRUG SCREEN (HOSP PERFORMED)  URINALYSIS, ROUTINE W REFLEX MICROSCOPIC  HEMOGLOBIN A1C  LIPID PANEL  I-STAT TROPOININ, ED    Imaging Review Ct Head Wo Contrast  02/19/2015   CLINICAL DATA:  Slurred speech, LEFT sided weakness, RIGHT gaze.  EXAM: CT HEAD WITHOUT CONTRAST  TECHNIQUE: Contiguous axial images were obtained from the base of the skull through the vertex without  intravenous contrast.  COMPARISON:  None.  FINDINGS: Mild blurring of the RIGHT posterior insula gray-white matter differentiation. No intraparenchymal hemorrhage, mass effect, midline shift. Ventricles and sulci are normal for patient's age, cavum septum pellucidum is a normal variant. Minimal white matter changes suggest chronic small vessel ischemic disease, less than expected for age.  No abnormal extra-axial fluid collections. Very mild suspected dense RIGHT middle cerebral artery, though there is a component of hemoconcentration as the basilar artery also dense. Basal cisterns are patent.  Ocular globes and orbital contents are unremarkable. Chronic RIGHT sphenoid sinusitis with small LEFT sphenoid air-fluid level. The mastoid air cells are well aerated. No skull fracture.  IMPRESSION: Findings concerning for early RIGHT middle cerebral artery territory infarct which could be confirmed on MRI of the brain with diffusion-weighted sequences as clinically indicated. There is a component of probable hemoconcentration.  Otherwise normal noncontrast CT of the head for age.  Acute findings discussed with and reconfirmed by Dr.KEVIN STEINL on 02/19/2015 at 6:52 pm.   Electronically Signed   By: Elon Alas   On: 02/19/2015 18:55     EKG Interpretation   Date/Time:  Sunday Feb 19 2015 19:03:15 EDT Ventricular Rate:  49 PR Interval:    QRS Duration: 188 QT Interval:  569 QTC Calculation: 514 R Axis:   96 Text Interpretation:  Sinus bradycardia Nonspecific intraventricular  conduction delay Nonspecific T wave abnormality Confirmed by Ashok Cordia  MD,  Lennette Bihari ([id number]) on 02/19/2015 8:39:00 PM      MDM  81 year old male brought with concern for stroke. Stroke was called prior to arrival. Neurology present on patient arrival to the emergency department. Patient does have right gaze preference as well as left-sided paresis. Also having difficulty with speech. Is protecting airway. No indication for  intubation. Patient was emergently taken CT scanner where determination is made to use having a early right MCA stroke. TPA was given. Family initially declined IR intervention. However after further deliberation decided to pursue this. Patient was subsequent taking the IR suite. Will be admitted to the ICU [facility] procedure  Final diagnoses:  Stroke with cerebral ischemia  Stroke with cerebral ischemia  Stroke with cerebral ischemia  Stroke        Robynn Pane, MD 02/19/15 2122  Lajean Saver, MD 02/20/15 [phone]

## 2015-02-19 NOTE — Progress Notes (Signed)
°   02/19/15 1911  Clinical Encounter Type  Visited With Patient;Family;Patient and family together;Health care provider  Visit Type Initial;Spiritual support;Code;ED  Referral From Family;Nurse  Spiritual Encounters  Spiritual Needs Prayer;Emotional  Stress Factors  Patient Stress Factors Loss  Family Stress Factors Health changes;Major life changes  Advance Directives (For Healthcare)  Does patient have an advance directive? Yes (Daughters stated they have living will they will bring in)  Type of Advance Directive Living will  Copy of advanced directive(s) in chart? No - copy requested   Responded to code in ED. Patient had a stroke and family needed emotional support as they were going to incubate the patient. Daughters did not want to incubate at first; and after counsel from staff they decided to allow the procedure. Patient's family is in consult room awaiting the patient to be moved to the third floor. Patient is preparing for incubation. Patient is stable and family is calm. Provided a ministry of presence, listening ear, and support.   Drue Dun, Contract Chaplain 02/19/2015 7:53 PM

## 2015-02-19 NOTE — ED Notes (Addendum)
Pt taken to IR

## 2015-02-19 NOTE — ED Notes (Signed)
Per EMS- pt was at home with wife sitting in chair when he began having left arm weakness, facial droop slurred speech. Pt was alert with EMS.

## 2015-02-19 NOTE — Procedures (Signed)
S/P  Bilateral common carotid arteriogram,followed by complete revascularization of occluded RT MCA M1 seg using x1 pass with the the Solitaire FR 54mmx 28mm stent retrieval device and 6 mg of superselective IA TPA  TICI 3 restoration

## 2015-02-20 ENCOUNTER — Inpatient Hospital Stay (HOSPITAL_COMMUNITY): Payer: Medicare PPO

## 2015-02-20 ENCOUNTER — Ambulatory Visit (HOSPITAL_COMMUNITY): Payer: Medicare PPO

## 2015-02-20 DIAGNOSIS — I63411 Cerebral infarction due to embolism of right middle cerebral artery: Principal | ICD-10-CM

## 2015-02-20 DIAGNOSIS — I6789 Other cerebrovascular disease: Secondary | ICD-10-CM

## 2015-02-20 DIAGNOSIS — I6340 Cerebral infarction due to embolism of unspecified cerebral artery: Secondary | ICD-10-CM | POA: Diagnosis present

## 2015-02-20 DIAGNOSIS — I25119 Atherosclerotic heart disease of native coronary artery with unspecified angina pectoris: Secondary | ICD-10-CM

## 2015-02-20 DIAGNOSIS — E785 Hyperlipidemia, unspecified: Secondary | ICD-10-CM

## 2015-02-20 DIAGNOSIS — I10 Essential (primary) hypertension: Secondary | ICD-10-CM

## 2015-02-20 DIAGNOSIS — J9600 Acute respiratory failure, unspecified whether with hypoxia or hypercapnia: Secondary | ICD-10-CM

## 2015-02-20 LAB — CBC WITH DIFFERENTIAL/PLATELET
Basophils Absolute: 0 10*3/uL (ref 0.0–0.1)
Basophils Relative: 0 % (ref 0–1)
Eosinophils Absolute: 0 10*3/uL (ref 0.0–0.7)
Eosinophils Relative: 0 % (ref 0–5)
HCT: 45.6 % (ref 39.0–52.0)
Hemoglobin: 15.5 g/dL (ref 13.0–17.0)
LYMPHS PCT: 5 % — AB (ref 12–46)
Lymphs Abs: 0.7 10*3/uL (ref 0.7–4.0)
MCH: 31.1 pg (ref 26.0–34.0)
MCHC: 34 g/dL (ref 30.0–36.0)
MCV: 91.6 fL (ref 78.0–100.0)
Monocytes Absolute: 0.6 10*3/uL (ref 0.1–1.0)
Monocytes Relative: 4 % (ref 3–12)
NEUTROS PCT: 91 % — AB (ref 43–77)
Neutro Abs: 12 10*3/uL — ABNORMAL HIGH (ref 1.7–7.7)
PLATELETS: 170 10*3/uL (ref 150–400)
RBC: 4.98 MIL/uL (ref 4.22–5.81)
RDW: 13.8 % (ref 11.5–15.5)
WBC: 13.3 10*3/uL — ABNORMAL HIGH (ref 4.0–10.5)

## 2015-02-20 LAB — GLUCOSE, CAPILLARY: Glucose-Capillary: 103 mg/dL — ABNORMAL HIGH (ref 65–99)

## 2015-02-20 LAB — BASIC METABOLIC PANEL
Anion gap: 14 (ref 5–15)
BUN: 15 mg/dL (ref 6–20)
CALCIUM: 8.3 mg/dL — AB (ref 8.9–10.3)
CHLORIDE: 103 mmol/L (ref 101–111)
CO2: 19 mmol/L — ABNORMAL LOW (ref 22–32)
CREATININE: 1.37 mg/dL — AB (ref 0.61–1.24)
GFR calc non Af Amer: 45 mL/min — ABNORMAL LOW (ref >60)
GFR, EST AFRICAN AMERICAN: 53 mL/min — AB (ref >60)
Glucose, Bld: 226 mg/dL — ABNORMAL HIGH (ref 65–99)
Potassium: 3.9 mmol/L (ref 3.5–5.1)
SODIUM: 136 mmol/L (ref 135–145)

## 2015-02-20 LAB — LIPID PANEL
CHOLESTEROL: 114 mg/dL (ref 0–200)
HDL: 44 mg/dL (ref >40)
LDL CALC: 56 mg/dL (ref 0–99)
TRIGLYCERIDES: 71 mg/dL (ref <150)
Total CHOL/HDL Ratio: 2.6 RATIO
VLDL: 14 mg/dL (ref 0–40)

## 2015-02-20 LAB — TROPONIN I
TROPONIN I: 0.07 ng/mL — AB (ref <0.031)
Troponin I: 0.08 ng/mL — ABNORMAL HIGH (ref <0.031)
Troponin I: 0.06 ng/mL — ABNORMAL HIGH (ref <0.031)

## 2015-02-20 LAB — MRSA PCR SCREENING: MRSA by PCR: NEGATIVE

## 2015-02-20 MED ORDER — FENTANYL CITRATE (PF) 100 MCG/2ML IJ SOLN
25.0000 ug | INTRAMUSCULAR | Status: DC | PRN
Start: 2015-02-20 — End: 2015-02-21

## 2015-02-20 MED ORDER — ATORVASTATIN CALCIUM 40 MG PO TABS
40.0000 mg | ORAL_TABLET | Freq: Every day | ORAL | Status: DC
  Administered 2015-02-21 – 2015-02-24 (×3): 40 mg via ORAL
  Filled 2015-02-20 (×5): qty 1

## 2015-02-20 MED ORDER — CETYLPYRIDINIUM CHLORIDE 0.05 % MT LIQD
7.0000 mL | Freq: Four times a day (QID) | OROMUCOSAL | Status: DC
Start: 2015-02-20 — End: 2015-02-24
  Administered 2015-02-20 – 2015-02-22 (×10): 7 mL via OROMUCOSAL

## 2015-02-20 MED ORDER — ASPIRIN 300 MG RE SUPP
300.0000 mg | Freq: Every day | RECTAL | Status: DC
  Administered 2015-02-20 – 2015-02-21 (×2): 300 mg via RECTAL
  Filled 2015-02-20 (×2): qty 1

## 2015-02-20 MED ORDER — CHLORHEXIDINE GLUCONATE 0.12 % MT SOLN
15.0000 mL | Freq: Two times a day (BID) | OROMUCOSAL | Status: DC
  Administered 2015-02-20 – 2015-02-24 (×6): 15 mL via OROMUCOSAL
  Filled 2015-02-20 (×10): qty 15

## 2015-02-20 MED ORDER — HYDRALAZINE HCL 20 MG/ML IJ SOLN: 10.0000 mg | INTRAMUSCULAR | Status: DC | PRN

## 2015-02-20 MED ORDER — ASPIRIN EC 325 MG PO TBEC
325.0000 mg | DELAYED_RELEASE_TABLET | Freq: Every day | ORAL | Status: DC
  Administered 2015-02-22 – 2015-02-24 (×3): 325 mg via ORAL
  Filled 2015-02-20 (×4): qty 1

## 2015-02-20 MED ORDER — PNEUMOCOCCAL VAC POLYVALENT 25 MCG/0.5ML IJ INJ
0.5000 mL | INJECTION | INTRAMUSCULAR | Status: AC
Start: 2015-02-21 — End: 2015-02-21
  Administered 2015-02-21: 0.5 mL via INTRAMUSCULAR
  Filled 2015-02-20: qty 0.5

## 2015-02-20 NOTE — Progress Notes (Signed)
MD wants pt to be given more time to urinate

## 2015-02-20 NOTE — Anesthesia Postprocedure Evaluation (Signed)
  Anesthesia Post-op Note  Patient: Parker [name]   Procedure(s) Performed: Procedure(s): RADIOLOGY WITH ANESTHESIA (N/A)  Patient Location: ICU  Anesthesia Type:General  Level of Consciousness: awake, alert , patient cooperative and responds to stimulation  Airway and Oxygen Therapy: Patient remains intubated per anesthesia plan and Patient placed on Ventilator (see vital sign flow sheet for setting)  Post-op Pain: none  Post-op Assessment: Post-op Vital signs reviewed, Patient's Cardiovascular Status Stable, Respiratory Function Stable, Patent Airway, No signs of Nausea or vomiting and Pain level controlled, beginning to move L extremities  Post-op Vital Signs: Reviewed and stable  Last Vitals:  Filed Vitals:   02/20/15 0030  BP: 133/49  Pulse: 51  Temp:   Resp: 17    Complications: No apparent anesthesia complications

## 2015-02-20 NOTE — Progress Notes (Signed)
OT Cancellation Note  Patient Details Name: Parker [name] MRN: [id number] DOB: [birth year]   Cancelled Treatment:    Reason Eval/Treat Not Completed: Patient not medically ready - Pt currently on bedrest.  Will reattempt.   Darlina Rumpf [place], OTR/L [phone]  02/20/2015, 3:52 PM

## 2015-02-20 NOTE — Progress Notes (Cosign Needed)
Referring Physician(s): Dr Erlinda Hong  Subjective:  CVA Rt MCA IA tpa with clot retrieval 5/22 10 pm Still with some confusion But doing better quickly Able to move all 4s Able to tell me his name Unable to say date or place correctly   Allergies: Review of patient's allergies indicates no known allergies.  Medications: Prior to Admission medications   Medication Sig Start Date End Date Taking? Authorizing Provider  aspirin EC 81 MG tablet Take 81 mg by mouth daily.   Yes Historical Provider, MD  lisinopril (PRINIVIL,ZESTRIL) 20 MG tablet Take 20 mg by mouth at bedtime.  06/28/14  Yes Historical Provider, MD  omeprazole (PRILOSEC) 20 MG capsule Take 20 mg by mouth daily. 10/19/14  Yes Historical Provider, MD  simvastatin (ZOCOR) 20 MG tablet Take 20 mg by mouth at bedtime. 10/19/14  Yes Historical Provider, MD     Vital Signs: BP 129/54 mmHg  Pulse 57  Temp(Src) 98.6 F (37 C) (Oral)  Resp 16  Ht 6\' 3"  (1.905 m)  Wt 89.6 kg (197 lb 8.5 oz)  BMI 24.69 kg/m2  SpO2 98%  Physical Exam  Abdominal:  Rt groin site clean and dry No bleeding; no hematoma Moving all 4s = Good strength Alert; not oriented Pleasant Unable to report date and time correctly  Confused Definite left mouth droop Left eyelid ptosis   Nursing note and vitals reviewed.   Imaging: Ct Head Wo Contrast  02/19/2015   CLINICAL DATA:  Concern for stroke. Left-sided paralysis. Post catheter directed thrombolysis.  EXAM: CT HEAD WITHOUT CONTRAST  TECHNIQUE: Contiguous axial images were obtained from the base of the skull through the vertex without intravenous contrast.  COMPARISON:  Head CT- 02/19/2015 ; catheter directed thrombolysis  FINDINGS: There is ill-defined blurring of the gray-white junction extending from the right temporal lobe (image 12, series 2) through the right posterior parietal lobe (image 16), compatible with involving infarct. No definite evidence of hemorrhagic conversion.  The  gray-white differentiation is otherwise well maintained. Unchanged size and configuration of the ventricles and basilar cisterns. Incidental note is again made of a cavum septum pellucidum. No midline shift. No definite intraparenchymal or extra-axial mass. Intracranial atherosclerosis.  Limited visualization the paranasal sinuses and mastoid air cells is normal. No air-fluid levels. Regional soft tissues appear normal. No displaced calvarial fracture.  IMPRESSION: Evolving right MCA distribution infarct without evidence of hemorrhagic conversion or mass effect/midline shift.   Electronically Signed   By: Sandi Mariscal M.D.   On: 02/19/2015 23:58   Ct Head Wo Contrast  02/19/2015   CLINICAL DATA:  Slurred speech, LEFT sided weakness, RIGHT gaze.  EXAM: CT HEAD WITHOUT CONTRAST  TECHNIQUE: Contiguous axial images were obtained from the base of the skull through the vertex without intravenous contrast.  COMPARISON:  None.  FINDINGS: Mild blurring of the RIGHT posterior insula gray-white matter differentiation. No intraparenchymal hemorrhage, mass effect, midline shift. Ventricles and sulci are normal for patient's age, cavum septum pellucidum is a normal variant. Minimal white matter changes suggest chronic small vessel ischemic disease, less than expected for age.  No abnormal extra-axial fluid collections. Very mild suspected dense RIGHT middle cerebral artery, though there is a component of hemoconcentration as the basilar artery also dense. Basal cisterns are patent.  Ocular globes and orbital contents are unremarkable. Chronic RIGHT sphenoid sinusitis with small LEFT sphenoid air-fluid level. The mastoid air cells are well aerated. No skull fracture.  IMPRESSION: Findings concerning for early RIGHT middle cerebral artery  territory infarct which could be confirmed on MRI of the brain with diffusion-weighted sequences as clinically indicated. There is a component of probable hemoconcentration.  Otherwise normal  noncontrast CT of the head for age.  Acute findings discussed with and reconfirmed by Dr.KEVIN STEINL on 02/19/2015 at 6:52 pm.   Electronically Signed   By: Elon Alas   On: 02/19/2015 18:55   Dg Chest Port 1 View  02/20/2015   CLINICAL DATA:  81 year old with cough.  EXAM: PORTABLE CHEST - 1 VIEW  COMPARISON:  None.  FINDINGS: Endotracheal tube is 4.4 cm above the carina. Nasogastric tube extends into the abdomen. Negative for a pneumothorax. Calcification in the right lower chest possibly related to a calcified granuloma. There may be additional calcifications in the right hilum. Findings may represent old granulomatous disease. Heart size is upper limits of normal. Few densities at the left lung base are suggestive for atelectasis but no significant airspace disease.  IMPRESSION: Endotracheal tube is appropriately positioned above the carina.  Suspect old granulomatous disease.   Electronically Signed   By: Markus Daft M.D.   On: 02/20/2015 09:24    Labs:  CBC:  Recent Labs  02/19/15 1830 02/19/15 1842 02/20/15 0512  WBC 6.4  --  13.3*  HGB 17.5* 18.7* 15.5  HCT 51.3 55.0* 45.6  PLT 159  --  170    COAGS:  Recent Labs  02/19/15 1830  INR 1.21  APTT 34    BMP:  Recent Labs  02/19/15 1830 02/19/15 1842 02/20/15 0512  NA 140 141 136  K 4.0 3.9 3.9  CL 104 104 103  CO2 26  --  19*  GLUCOSE 105* 106* 226*  BUN 13 16 15   CALCIUM 9.3  --  8.3*  CREATININE 1.12 1.10 1.37*  GFRNONAA 58*  --  45*  GFRAA >60  --  53*    LIVER FUNCTION TESTS:  Recent Labs  02/19/15 1830  BILITOT 1.1  AST 23  ALT 14*  ALKPHOS 97  PROT 6.9  ALBUMIN 3.8    Assessment and Plan:  CVA R MCA ia tpa and clot retrieval Better daily Still confused Pleasant Will follow  Signed: Ethylene Reznick A 02/20/2015, 2:40 PM   I spent a total of 15 Minutes in face to face in clinical consultation/evaluation, greater than 50% of which was counseling/coordinating care for CVA; R MCA  clot retrieval

## 2015-02-20 NOTE — Progress Notes (Signed)
PT Cancellation Note  Patient Details Name: Parker [name] MRN: [id number] DOB: 1926/12/08   Cancelled Treatment:    Reason Eval/Treat Not Completed: Patient not medically ready; patient on bedrest s/p sheath removal, on vent.  Will attempt tomorrow.   WYNN,CYNDI 02/20/2015, 10:44 AM  Magda Kiel, PT [phone] 02/20/2015

## 2015-02-20 NOTE — Progress Notes (Signed)
3F foley catheter placed per order for acute urinary retention under sterile precautions.   Parker [name]

## 2015-02-20 NOTE — Progress Notes (Signed)
Rt 9 fr sheath removal, no hematoma present before sheath pull, exoceal deployed and held manual pressure for 15 minutes. Tegaderm and pressure dressing applied along with a 10lb sandbag. Verified site with [facility].  France Ravens RT R, [place]

## 2015-02-20 NOTE — Progress Notes (Signed)
SLP Cancellation Note  Patient Details Name: JKWON TREPTOW MRN: [id number] DOB: 08-Jun-1927   Cancelled treatment:       Reason Eval/Treat Not Completed: Medical issues which prohibited therapy (Patient on vent. will f/u 5/24. )  Gabriel Rainwater [place], [place] [phone]  Alfonzo Arca Meryl 02/20/2015, 8:38 AM

## 2015-02-20 NOTE — Progress Notes (Signed)
STROKE TEAM PROGRESS NOTE   SUBJECTIVE (INTERVAL HISTORY) His daughter is at the bedside.  Overall he feels his condition is rapidly improving. He is able to move left UE and LE spontaneously with better strength. Still intubated and pending extubation and MRI. Has urinary rentention and bladder scan 400. Currently will hold off foley, if more than 500 and pt is not able to urinate then consider foley.   OBJECTIVE Temp:  [96.1 F (35.6 C)-98.5 F (36.9 C)] 97.1 F (36.2 C) (05/23 0800) Pulse Rate:  [37-76] 56 (05/23 0800) Cardiac Rhythm:  [-]  Resp:  [13-25] 13 (05/23 0800) BP: (103-190)/(37-120) 112/49 mmHg (05/23 0800) SpO2:  [94 %-100 %] 99 % (05/23 0800) Arterial Line BP: (81-157)/(31-73) 119/45 mmHg (05/23 0800) FiO2 (%):  [40 %-60 %] 40 % (05/23 0330) Weight:  [182 lb 12.2 oz (82.901 kg)-197 lb 8.5 oz (89.6 kg)] 197 lb 8.5 oz (89.6 kg) (05/22 2248)   Recent Labs Lab 02/19/15 1828  GLUCAP 103*    Recent Labs Lab 02/19/15 1830 02/19/15 1842 02/20/15 0512  NA 140 141 136  K 4.0 3.9 3.9  CL 104 104 103  CO2 26  --  19*  GLUCOSE 105* 106* 226*  BUN 13 16 15   CREATININE 1.12 1.10 1.37*  CALCIUM 9.3  --  8.3*    Recent Labs Lab 02/19/15 1830  AST 23  ALT 14*  ALKPHOS 97  BILITOT 1.1  PROT 6.9  ALBUMIN 3.8    Recent Labs Lab 02/19/15 1830 02/19/15 1842 02/20/15 0512  WBC 6.4  --  13.3*  NEUTROABS 3.3  --  12.0*  HGB 17.5* 18.7* 15.5  HCT 51.3 55.0* 45.6  MCV 92.1  --  91.6  PLT 159  --  170   No results for input(s): CKTOTAL, CKMB, CKMBINDEX, TROPONINI in the last 168 hours.  Recent Labs  02/19/15 1830  LABPROT 15.5*  INR 1.21   No results for input(s): COLORURINE, LABSPEC, PHURINE, GLUCOSEU, HGBUR, BILIRUBINUR, KETONESUR, PROTEINUR, UROBILINOGEN, NITRITE, LEUKOCYTESUR in the last 72 hours.  Invalid input(s): APPERANCEUR     Component Value Date/Time   CHOL 114 02/20/2015 0511   TRIG 71 02/20/2015 0511   HDL 44 02/20/2015 0511   CHOLHDL  2.6 02/20/2015 0511   VLDL 14 02/20/2015 0511   LDLCALC 56 02/20/2015 0511   No results found for: HGBA1C No results found for: LABOPIA, COCAINSCRNUR, [place], [place], [facility], [place]   Recent Labs Lab 02/19/15 [place] <5    I have personally reviewed the radiological images below and agree with the radiology interpretations.  Ct Head Wo Contrast  02/19/2015  IMPRESSION: Evolving right MCA distribution infarct without evidence of hemorrhagic conversion or mass effect/midline shift.    Ct Head Wo Contrast  02/19/2015   IMPRESSION: Findings concerning for early RIGHT middle cerebral artery territory infarct which could be confirmed on MRI of the brain with diffusion-weighted sequences as clinically indicated. There is a component of probable hemoconcentration.  Otherwise normal noncontrast CT of the head for age.   MRI and MRA pending  2D Echocardiogram  Pending  CXR - pending  EKG  sinus bradycardia. For complete results please see formal report.  PHYSICAL EXAM  Temp:  [96.1 F (35.6 C)-98.5 F (36.9 C)] 97.1 F (36.2 C) (05/23 0800) Pulse Rate:  [37-76] 56 (05/23 0800) Resp:  [13-25] 13 (05/23 0800) BP: (103-190)/(37-120) 112/49 mmHg (05/23 0800) SpO2:  [94 %-100 %] 99 % (05/23 0800) Arterial Line BP: (81-157)/(31-73) 119/45 mmHg (  05/23 0800) FiO2 (%):  [40 %-60 %] 40 % (05/23 0330) Weight:  [182 lb 12.2 oz (82.901 kg)-197 lb 8.5 oz (89.6 kg)] 197 lb 8.5 oz (89.6 kg) (05/22 2248)  General - Well nourished, well developed, intubated but following commands.  Ophthalmologic - not cooperative on exam.  Cardiovascular - Regular rate and rhythm.  Neuro - awake, alert, following commands both centrally and peripherally. Still intubated not on sedation but on cardene. PERRL, EOMI, left eyelid apraxia. No significant facial asymmetry. RUE and RLE 5/5, LUE 4/5 with pronator drift. LLE 3/5 proximal but 4/5 distal. Reflex 1+ and no babinski.   ASSESSMENT/PLAN [name] is a 81 y.o. male with history of CAD s/p stenting admitted for aphasia and left sided weakness. Received tPA and mechanical thrombectomy. Symptoms rapid improving.    Stroke:  Non-dominant right MCA territory infarct, embolic secondary to unknown source. S/p tPA and intervention.   MRI  pending  MRA  pending  Cerebral angio showed right M1 cut off. After thrombectomy, TICI 3 recannulization.  2D Echo  Pending  LE venous doppler pending  LDL 56  HgbA1c pending  Within tPA window for VTE prophylaxis  Diet NPO time specified   aspirin 81 mg orally every day prior to admission, now on within tPA window.  Pt may need TEE and loop recorder to rule out afib  Ongoing aggressive stroke risk factor management  Therapy recommendations:  pending  Disposition:  pending  Hypertension  Home meds:   Lisinopril  Permissive hypertension (OK if <180/105) for 24-48 hours post stroke and then gradually normalized within 5-7 days. Currently on cardene drip  Stable  Patient counseled to be compliant with his blood pressure medications  Hyperlipidemia  Home meds:  zocor 20   Currently on no statin due to NPO status  LDL 56, goal < 70  Continue statin when po access  CAD s/p stenting  Trend troponin  Within tPA window  May resume antiplatelet after tPA window  Other Stroke Risk Factors  Advanced age  Coronary artery disease  Other Active Problems  Mild leukocytosis  Other Pertinent History    Hospital day # 1  This patient is critically ill due to right MCA stroke s/p tPA and IR and at significant risk of neurological worsening, death form recurrent stroke, hemorrhagic transformation, CAD or MI. This patient's care requires constant monitoring of vital signs, hemodynamics, respiratory and cardiac monitoring, review of multiple databases, neurological assessment, discussion with family, other specialists and medical decision making of high complexity. I spent  45 minutes of neurocritical care time in the care of this patient.   Rosalin Hawking, MD PhD Stroke Neurology 02/20/2015 8:23 AM    To contact Stroke Continuity provider, please refer to [email or web address] After hours, contact General Neurology

## 2015-02-20 NOTE — Care Management Note (Signed)
Case Management Note  Patient Details  Name: ABDOULAYE DRUM MRN: [id number] Date of Birth: 05/26/1927  Subjective/Objective:    Pt admitted on 02/19/15 with CVA.  PTA, pt resided at home alone.                  Action/Plan: PT/OT/ST evaluations pending.  Will follow for recommendations for next level of care needed.    Expected Discharge Date:                  Expected Discharge Plan:  Skilled Nursing Facility  In-House Referral:  Clinical Social Work  Discharge planning Services  CM Consult  Post Acute Care Choice:    Choice offered to:     DME Arranged:    DME Agency:     HH Arranged:    [place] Agency:     Status of Service:  In process, will continue to follow  Medicare Important Message Given:    Date Medicare IM Given:    Medicare IM give by:    Date Additional Medicare IM Given:    Additional Medicare Important Message give by:     If discussed at [place] of Stay Meetings, dates discussed:    Additional Comments:  Reinaldo Raddle, RN, BSN  Trauma/Neuro ICU Case Manager [phone]

## 2015-02-20 NOTE — Progress Notes (Signed)
RASS 0. + F/C. Has passed SBT. No distress. Sheath just removed  Filed Vitals:   02/20/15 1130 02/20/15 1157 02/20/15 1200 02/20/15 1300  BP: 133/67  117/62 132/49  Pulse: 67  63 48  Temp:  98.6 F (37 C)    TempSrc:  Oral    Resp: 20  13 15   Height:      Weight:      SpO2: 100%  99% 98%   NAD HEENT WNL Chest clear Reg, no M Abd soft, +BS Ext warm, no edema MAEs  I have reviewed all of today's lab results. Relevant abnormalities are discussed in the A/P section  CXR: minimal L basilar atx/inf  IMP: VDRF - intubated for neuro IR intervention. Has passed SBT and cognition is excellent.   PLAN: Extubate today Monitor resp status in ICU post extubation Supp O2 as needed Rest of care per Stroke team and neuro IR team  If tolerates extubation as expected, PCCM will s/o. Please call if we can be of further assistance  Merton Border, MD ; [facility] [phone].  After 5:30 PM or weekends, call [phone]

## 2015-02-20 NOTE — Procedures (Signed)
Extubation Procedure Note  Patient Details:   Name: Parker [name] DOB: May 26, 1927 MRN: [id number]   Airway Documentation:   patient had an audible cuff leak prior to extubation.  Extubated to 4lpm [place] sat 97%.  Good cough and able to state his name.  Will continue to monitor.  Evaluation  O2 sats: stable throughout Complications: No apparent complications Patient did tolerate procedure well. Bilateral Breath Sounds: Clear Suctioning: Oral, Airway Yes  Ned Grace 02/20/2015, 11:12 AM

## 2015-02-21 ENCOUNTER — Encounter (HOSPITAL_COMMUNITY): Payer: Self-pay | Admitting: *Deleted

## 2015-02-21 ENCOUNTER — Encounter (HOSPITAL_COMMUNITY): Payer: Medicare PPO

## 2015-02-21 DIAGNOSIS — R001 Bradycardia, unspecified: Secondary | ICD-10-CM

## 2015-02-21 DIAGNOSIS — I63411 Cerebral infarction due to embolism of right middle cerebral artery: Secondary | ICD-10-CM | POA: Diagnosis not present

## 2015-02-21 DIAGNOSIS — I255 Ischemic cardiomyopathy: Secondary | ICD-10-CM

## 2015-02-21 DIAGNOSIS — I639 Cerebral infarction, unspecified: Secondary | ICD-10-CM

## 2015-02-21 LAB — GLUCOSE, CAPILLARY
GLUCOSE-CAPILLARY: 133 mg/dL — AB (ref 65–99)
Glucose-Capillary: 98 mg/dL (ref 65–99)

## 2015-02-21 LAB — HEMOGLOBIN A1C
HEMOGLOBIN A1C: 6.1 % — AB (ref 4.8–5.6)
MEAN PLASMA GLUCOSE: 128 mg/dL

## 2015-02-21 MED ORDER — ENOXAPARIN SODIUM 40 MG/0.4ML ~~LOC~~ SOLN
40.0000 mg | SUBCUTANEOUS | Status: DC
  Administered 2015-02-21: 40 mg via SUBCUTANEOUS
  Filled 2015-02-21: qty 0.4

## 2015-02-21 MED ORDER — HYDRALAZINE HCL 20 MG/ML IJ SOLN
10.0000 mg | INTRAMUSCULAR | Status: DC | PRN
  Filled 2015-02-21: qty 1

## 2015-02-21 MED ORDER — PANTOPRAZOLE SODIUM 40 MG PO TBEC
40.0000 mg | DELAYED_RELEASE_TABLET | Freq: Every day | ORAL | Status: DC
  Administered 2015-02-21 – 2015-02-24 (×4): 40 mg via ORAL
  Filled 2015-02-21 (×4): qty 1

## 2015-02-21 NOTE — Progress Notes (Signed)
Copy of pt's HCPOA and advanced directive placed in chart.

## 2015-02-21 NOTE — Evaluation (Signed)
Speech Language Pathology Evaluation Patient Details Name: Parker [name] MRN: [id number] DOB: 1927-04-16 Today's Date: 02/21/2015 Time: 1829-9371 SLP Time Calculation (min) (ACUTE ONLY): 25 min  Problem List:  Patient Active Problem List   Diagnosis Date Noted  . CVA (cerebral infarction) 02/20/2015  . Stroke with cerebral ischemia    Past Medical History:  Past Medical History  Diagnosis Date  . MI (myocardial infarction)   . Hypertension   . Hyperlipidemia   . Bradycardia     per girlfriend, has refused pacemaker    Past Surgical History:  Past Surgical History  Procedure Laterality Date  . Back surgery     HPI:  81 y.o. male admitted with CVA s/p tPA followed by complete revascularization of occluded RT MCA .  Intubated 5/22-23.     Assessment / Plan / Recommendation Clinical Impression  Pt presents with moderate cognitive deficits marked by difficulty sustaining and appropriately ceasing attention to specific tasks; impulsivity; impaired short-term memory and insight into deficits.  Pt will benefit from a CIR consult; recommend SLP f/u to address safety and cognition.  Family agrees.        SLP Assessment  Patient needs continued Speech Lanaguage Pathology Services    Follow Up Recommendations    CIR consult   Frequency and Duration min 3x week  1 week   Pertinent Vitals/Pain Pain Assessment: No/denies pain   SLP Goals  Potential to Achieve Goals (ACUTE ONLY): Good  SLP Evaluation Prior Functioning  Cognitive/Linguistic Baseline: Baseline deficits Baseline deficit details: mild short-term recall  Lives With: Significant other Available Help at Discharge: Family   Cognition  Overall Cognitive Status: Impaired/Different from baseline Arousal/Alertness: Awake/alert Orientation Level: Oriented to person;Oriented to place;Disoriented to time Attention: Sustained Sustained Attention: Impaired Sustained Attention Impairment: Verbal basic;Functional  basic Memory: Impaired Memory Impairment: Storage deficit;Retrieval deficit;Decreased short term memory Decreased Short Term Memory: Verbal basic Awareness: Impaired Awareness Impairment: Intellectual impairment Problem Solving: Impaired Problem Solving Impairment: Verbal basic Behaviors: Impulsive;Perseveration Safety/Judgment: Impaired    Comprehension  Auditory Comprehension Overall Auditory Comprehension: Appears within functional limits for tasks assessed Visual Recognition/Discrimination Discrimination: Within Function Limits    Expression Expression Primary Mode of Expression: Verbal Verbal Expression Overall Verbal Expression: Appears within functional limits for tasks assessed Written Expression Dominant Hand: Right   Oral / Motor Oral Motor/Sensory Function Overall Oral Motor/Sensory Function: Impaired (mild decrease Left CN V,VII) Motor Speech Overall Motor Speech: Appears within functional limits for tasks assessed   Parker [name] L. Tivis Ringer, [place] CCC/SLP Pager [phone]      Parker [name] 02/21/2015, 12:21 PM

## 2015-02-21 NOTE — Progress Notes (Signed)
Pt arrived to 4N19 at current time.  Pt A&O x 4.   Pt V/S taken, pt on 1 L via [place],  Foley intact, unclamped. Pt without distress.  Family at the bedside. Pt NPO but SLP ordered, report given to bedside RN.

## 2015-02-21 NOTE — Evaluation (Signed)
Clinical/Bedside Swallow Evaluation Patient Details  Name: Parker [name] MRN: [id number] Date of Birth: 1926/12/19  Today's Date: 02/21/2015 Time: SLP Start Time (ACUTE ONLY): 1100 SLP Stop Time (ACUTE ONLY): 1123 SLP Time Calculation (min) (ACUTE ONLY): 23 min  Past Medical History:  Past Medical History  Diagnosis Date  . MI (myocardial infarction)   . Hypertension   . Hyperlipidemia   . Bradycardia     per girlfriend, has refused pacemaker    Past Surgical History:  Past Surgical History  Procedure Laterality Date  . Back surgery     HPI:  81 y.o. male admitted with CVA s/p tPA followed by complete revascularization of occluded RT MCA .  Intubated 5/22-23.     Assessment / Plan / Recommendation Clinical Impression  Pt presents with a mild dysphagia marked by mild decreased CN V, VII on left with oral deficits specific to inattention to and post-swallow residue in left oral cavity.  When solids and liquids are consumed simultaneously, pt demonstrates mild signs of aspiration - signs were minimized with general precautions.  Recommend resuming a regular diet, thin liquids; give meds whole in puree. Will need frequent, intermittent supervision with meals to ensure safety due to cognitive deficits.     Aspiration Risk  Mild    Diet Recommendation Age appropriate regular solids;Thin   Medication Administration: Whole meds with puree Compensations: Check for pocketing;Small sips/bites    Other  Recommendations Oral Care Recommendations: Oral care BID   Follow Up Recommendations       Frequency and Duration min 3x week  2 weeks     Swallow Study Prior Functional Status       General Date of Onset: 02/19/15 Other Pertinent Information: 81 y.o. male admitted with CVA s/p tPA followed by complete revascularization of occluded RT MCA .  Intubated 5/22-23.   Type of Study: Bedside swallow evaluation Previous Swallow Assessment: no Diet Prior to this Study:  NPO Temperature Spikes Noted: No Respiratory Status: Room air History of Recent Intubation: Yes Length of Intubations (days): 1 days Date extubated: 02/20/15 Behavior/Cognition: Alert;Confused Oral Cavity - Dentition: Edentulous Self-Feeding Abilities: Able to feed self Patient Positioning: Upright in bed Baseline Vocal Quality: Normal Volitional Cough: Strong Volitional Swallow: Able to elicit    Oral/Motor/Sensory Function Overall Oral Motor/Sensory Function: Impaired (mild decrease left V, VII)   Ice Chips Ice chips: Within functional limits Presentation: Spoon   Thin Liquid Thin Liquid: Impaired Presentation: Cup;Self Fed Pharyngeal  Phase Impairments: Multiple swallows;Throat Clearing - Immediate (occasional throat-clearing when consuming liquids in conjunction with solids)    Nectar Thick Nectar Thick Liquid: Not tested   Honey Thick Honey Thick Liquid: Not tested   Puree Puree: Within functional limits   Solid  Mykael Trott L. [place], [place] CCC/SLP Pager [phone]     Solid: Impaired Presentation: Self Fed Oral Phase Functional Implications: Left lateral sulci pocketing       [name] 02/21/2015,11:59 AM

## 2015-02-21 NOTE — Progress Notes (Signed)
Heart rate remains in 30s, bradycardic, junctional complexes. Family/pt state interest in pacemaker consult. Spoke with neurology regarding this discussion.

## 2015-02-21 NOTE — Progress Notes (Addendum)
[facility] PROGRESS NOTE  Parker [name] QQP:[id number] DOB: 05/16/1927 DOA: 02/19/2015 PCP: No primary care provider on file.  Brief Summary  The patient is an 81 year old male with history of CAD status post stents, systolic dysfunction with an ejection fraction of 25-30%, atrial fibrillation on aspirin, bradycardia in the 40s-50s with history of second degree heart block for which he has previously declined PPM, hypertension, hyperlipidemia.  The patient experienced sudden onset left-sided weakness and presented to the emergency department immediately. He was found to have a large right MCA infarct with left occipital infarct.  He was given TPA.  He continues to have left hemi-neglect. He has been seen this afternoon by EP however I believe that he has atrial fibrillation on telemetry. He also has a history of atrial fibrillation and was only on a daily aspirin for stroke prevention despite his history of hypertension and cardiomyopathy and a CHADs2vasc score of 5 and before this stroke which now puts his CHADs2vasc score up to 7.    I see that the primary service has ordered an EP consult for loop recorder, TEE to look for evidence of source of embolic stroke. This is unnecessary and I have notified cardiology. This patient has atrial fibrillation on telemetry and has had atrial fibrillation documented at his cardiologist's office.     Assessment/Plan  Stroke: Non-dominant right large MCA territory infarct as well as left occipital small infarct, embolic secondary to unknown source. Likely due to cardiomyopathy with low EF. S/p tPA and intervention.   MRI Right large MCA infarct as well as left occipital small infarct  MRA Right M2 stenosis  Cerebral angio showed right M1 cut off. After thrombectomy, TICI 3 recannulization.  2D Echo EF 25-30%  I do not think he needs lower extremity venous duplex as he has no symptoms of DVT and he has a history of atrial fibrillation which very  likely explains his large embolic strokes.  I will cancel his venous duplex.  LDL 56  HgbA1c 6.1  Atrial fibrillation with bradycardia, CHADs2vasc score 7.  -  Encourage consideration of a NOAC such as apixaban in this patient but I will defer to cardiology and neurology  -  Agree with the EP consult for possible pacemaker insertion if the patient were to become agreeable, for now, although he does have episodes of bradycardia down to the 20s and 30s, his blood pressure remained stable and he is a symptomatically.   Cardiomyopathy, EF 25-30%, chronic.  He had a negative treadmill test in 2013.  Continue ACE inhibitor   No beta blocker secondary to severe bradycardia   Patient appears euvolemic, no indication for Lasix at this time  Follow-up with Dr. Ubaldo Glassing post discharge  Hypertension, mildly elevated.  -  Neurology has resumed lisinopril and I agree.  -  If lisinopril is titrated and he continues to be hypertensive, consider hydralazine/Imdur.  Hyperlipidemia, agree with high-dose statin if patient tolerates. He is greater than 17 years old, and therefore if he is intolerant could reduce the dose of Lipitor to 10 or 20 mg.  CAD s/p stenting, states that his perceived symptoms when he had a stroke were consistent with his previous heart attack of tight band around his chest and inability to breathe.  Mild elevation of troponin was likely secondary to his recent stroke  On ASA 325, high-dose statin, no beta blocker secondary to bradycardia    HPI/Subjective:  The patient states that he feels fine. He denies any weakness, numbness,  confusion, changes to vision  Objective: Filed Vitals:   02/21/15 0900 02/21/15 1000 02/21/15 1051 02/21/15 1330  BP: 144/62 134/65 115/93 143/63  Pulse: 36 51 50 36  Temp:   98.3 F (36.8 C) 97.5 F (36.4 C)  TempSrc:   Oral Oral  Resp: 12 20 20 20   Height:      Weight:      SpO2: 99% 98% 98% 96%    Intake/Output Summary (Last 24 hours)  at 02/21/15 1636 Last data filed at 02/21/15 1000  Gross per 24 hour  Intake   1075 ml  Output   1100 ml  Net    -25 ml   Filed Weights   02/19/15 1920 02/19/15 2248  Weight: 82.901 kg (182 lb 12.2 oz) 89.6 kg (197 lb 8.5 oz)    Exam:   General:  No acute distress, lying on bed leaning to the left side   HEENT:  NCAT, MMM  Cardiovascular:  RRR, nl S1, S2 no mrg, 2+ pulses, warm extremities  Respiratory:Faint rales at the left base that clear with repeat respirations, no rhonchi or wheeze , no increased WOB  Abdomen:   NABS, soft, NT/ND  MSK:   Normal tone and bulk, no LEE  Neuro:  clear left hemi-neglect. When asked to close his eyes and closes both eyes and in the left pops open right away and he does not seem to notice. Unilateral smile on the right side. Left facial droop. He will grip with his left hand with encouragement and prompting, but it took him several attempts to let go of my left hand when I asked him to as he did not notice that he was gripping it. Strength appears to be intact. Sensation intact to light touch.   Data Reviewed: Basic Metabolic Panel:  Recent Labs Lab 02/19/15 1830 02/19/15 1842 02/20/15 0512  NA 140 141 136  K 4.0 3.9 3.9  CL 104 104 103  CO2 26  --  19*  GLUCOSE 105* 106* 226*  BUN 13 16 15   CREATININE 1.12 1.10 1.37*  CALCIUM 9.3  --  8.3*   Liver Function Tests:  Recent Labs Lab 02/19/15 1830  AST 23  ALT 14*  ALKPHOS 97  BILITOT 1.1  PROT 6.9  ALBUMIN 3.8   No results for input(s): LIPASE, AMYLASE in the last 168 hours. No results for input(s): AMMONIA in the last 168 hours. CBC:  Recent Labs Lab 02/19/15 1830 02/19/15 1842 02/20/15 0512  WBC 6.4  --  13.3*  NEUTROABS 3.3  --  12.0*  HGB 17.5* 18.7* 15.5  HCT 51.3 55.0* 45.6  MCV 92.1  --  91.6  PLT 159  --  170   Cardiac Enzymes:  Recent Labs Lab 02/20/15 0953 02/20/15 1524 02/20/15 2100  TROPONINI 0.08* 0.06* 0.07*   BNP (last 3 results) No  results for input(s): BNP in the last 8760 hours.  ProBNP (last 3 results) No results for input(s): PROBNP in the last 8760 hours.  CBG:  Recent Labs Lab 02/19/15 1828 02/20/15 2356 02/21/15 1152  GLUCAP 103* 98 133*    Recent Results (from the past 240 hour(s))  MRSA PCR Screening     Status: None   Collection Time: 02/19/15 11:02 PM  Result Value Ref Range Status   MRSA by PCR NEGATIVE NEGATIVE Final    Comment:        The GeneXpert MRSA Assay (FDA approved for NASAL specimens only), is one component of a comprehensive  MRSA colonization surveillance program. It is not intended to diagnose MRSA infection nor to guide or monitor treatment for MRSA infections.      Studies: Ct Head Wo Contrast  02/19/2015   CLINICAL DATA:  Concern for stroke. Left-sided paralysis. Post catheter directed thrombolysis.  EXAM: CT HEAD WITHOUT CONTRAST  TECHNIQUE: Contiguous axial images were obtained from the base of the skull through the vertex without intravenous contrast.  COMPARISON:  Head CT- 02/19/2015 ; catheter directed thrombolysis  FINDINGS: There is ill-defined blurring of the gray-white junction extending from the right temporal lobe (image 12, series 2) through the right posterior parietal lobe (image 16), compatible with involving infarct. No definite evidence of hemorrhagic conversion.  The gray-white differentiation is otherwise well maintained. Unchanged size and configuration of the ventricles and basilar cisterns. Incidental note is again made of a cavum septum pellucidum. No midline shift. No definite intraparenchymal or extra-axial mass. Intracranial atherosclerosis.  Limited visualization the paranasal sinuses and mastoid air cells is normal. No air-fluid levels. Regional soft tissues appear normal. No displaced calvarial fracture.  IMPRESSION: Evolving right MCA distribution infarct without evidence of hemorrhagic conversion or mass effect/midline shift.   Electronically Signed    By: Sandi Mariscal M.D.   On: 02/19/2015 23:58   Ct Head Wo Contrast  02/19/2015   CLINICAL DATA:  Slurred speech, LEFT sided weakness, RIGHT gaze.  EXAM: CT HEAD WITHOUT CONTRAST  TECHNIQUE: Contiguous axial images were obtained from the base of the skull through the vertex without intravenous contrast.  COMPARISON:  None.  FINDINGS: Mild blurring of the RIGHT posterior insula gray-white matter differentiation. No intraparenchymal hemorrhage, mass effect, midline shift. Ventricles and sulci are normal for patient's age, cavum septum pellucidum is a normal variant. Minimal white matter changes suggest chronic small vessel ischemic disease, less than expected for age.  No abnormal extra-axial fluid collections. Very mild suspected dense RIGHT middle cerebral artery, though there is a component of hemoconcentration as the basilar artery also dense. Basal cisterns are patent.  Ocular globes and orbital contents are unremarkable. Chronic RIGHT sphenoid sinusitis with small LEFT sphenoid air-fluid level. The mastoid air cells are well aerated. No skull fracture.  IMPRESSION: Findings concerning for early RIGHT middle cerebral artery territory infarct which could be confirmed on MRI of the brain with diffusion-weighted sequences as clinically indicated. There is a component of probable hemoconcentration.  Otherwise normal noncontrast CT of the head for age.  Acute findings discussed with and reconfirmed by Dr.KEVIN STEINL on 02/19/2015 at 6:52 pm.   Electronically Signed   By: Elon Alas   On: 02/19/2015 18:55   Mr Brain Wo Contrast  02/20/2015   CLINICAL DATA:  Left-sided paralysis. Recent endovascular procedure.  EXAM: MRI HEAD WITHOUT CONTRAST  MRA HEAD WITHOUT CONTRAST  TECHNIQUE: Multiplanar, multiecho pulse sequences of the brain and surrounding structures were obtained without intravenous contrast. Angiographic images of the head were obtained using MRA technique without contrast.  COMPARISON:  CT head  522.  Endovascular procedure 5/22  FINDINGS: MRI HEAD FINDINGS  There is an extensive area of acute infarction affecting most of the RIGHT middle cerebral artery territory including the frontal, temporal, and parietal lobes, the insular cortex and subcortical white matter, as well as the basal ganglia including the caudate and lentiform nucleus. There is some sparing of the subcortical and periventricular white matter on the RIGHT. A tiny incidental region of cortical and subcortical infarction affects the LEFT lobe.  Generalized atrophy. Chronic microvascular ischemic change. No  petechial hemorrhage or foci of chronic hemorrhage. Flow void patency of the RIGHT ICA and MCA has been re-established. Cavum septum pellucidum and vergae. Early right-to-left midline shift estimated 3 mm. No extra-axial fluid collection. No tonsillar or uncal herniation. Extracranial soft tissues grossly unremarkable.  MRA HEAD FINDINGS  Both internal carotid arteries are patent without flow-limiting stenosis. Both middle cerebral arteries are patent without flow-limiting stenosis. Minor proximal oriented LEFT A1 ACA, non flow reducing. Minor irregularity of the proximal RIGHT M2 branches. Basilar artery widely patent with vertebrals codominant. Severe stenosis P2 RIGHT PCA. No cerebellar branch occlusion. No intracranial aneurysm or vascular occlusion.  IMPRESSION: Extensive area of acute infarction affecting most of the RIGHT middle cerebral artery territory, including the basal ganglia. No hemorrhagic transformation.  Tiny incidental area of ischemia LEFT occipital lobe, of no clinical significance.  Early right-to-left shift of 3 mm.  Patency of the proximal vasculature is been re-established with endovascular treatment.   Electronically Signed   By: Rolla Flatten M.D.   On: 02/20/2015 18:01   Dg Chest Port 1 View  02/20/2015   CLINICAL DATA:  81 year old with cough.  EXAM: PORTABLE CHEST - 1 VIEW  COMPARISON:  None.  FINDINGS:  Endotracheal tube is 4.4 cm above the carina. Nasogastric tube extends into the abdomen. Negative for a pneumothorax. Calcification in the right lower chest possibly related to a calcified granuloma. There may be additional calcifications in the right hilum. Findings may represent old granulomatous disease. Heart size is upper limits of normal. Few densities at the left lung base are suggestive for atelectasis but no significant airspace disease.  IMPRESSION: Endotracheal tube is appropriately positioned above the carina.  Suspect old granulomatous disease.   Electronically Signed   By: Markus Daft M.D.   On: 02/20/2015 09:24   Mr Jodene Nam Head/brain Wo Cm  02/20/2015   CLINICAL DATA:  Left-sided paralysis. Recent endovascular procedure.  EXAM: MRI HEAD WITHOUT CONTRAST  MRA HEAD WITHOUT CONTRAST  TECHNIQUE: Multiplanar, multiecho pulse sequences of the brain and surrounding structures were obtained without intravenous contrast. Angiographic images of the head were obtained using MRA technique without contrast.  COMPARISON:  CT head 522.  Endovascular procedure 5/22  FINDINGS: MRI HEAD FINDINGS  There is an extensive area of acute infarction affecting most of the RIGHT middle cerebral artery territory including the frontal, temporal, and parietal lobes, the insular cortex and subcortical white matter, as well as the basal ganglia including the caudate and lentiform nucleus. There is some sparing of the subcortical and periventricular white matter on the RIGHT. A tiny incidental region of cortical and subcortical infarction affects the LEFT lobe.  Generalized atrophy. Chronic microvascular ischemic change. No petechial hemorrhage or foci of chronic hemorrhage. Flow void patency of the RIGHT ICA and MCA has been re-established. Cavum septum pellucidum and vergae. Early right-to-left midline shift estimated 3 mm. No extra-axial fluid collection. No tonsillar or uncal herniation. Extracranial soft tissues grossly  unremarkable.  MRA HEAD FINDINGS  Both internal carotid arteries are patent without flow-limiting stenosis. Both middle cerebral arteries are patent without flow-limiting stenosis. Minor proximal oriented LEFT A1 ACA, non flow reducing. Minor irregularity of the proximal RIGHT M2 branches. Basilar artery widely patent with vertebrals codominant. Severe stenosis P2 RIGHT PCA. No cerebellar branch occlusion. No intracranial aneurysm or vascular occlusion.  IMPRESSION: Extensive area of acute infarction affecting most of the RIGHT middle cerebral artery territory, including the basal ganglia. No hemorrhagic transformation.  Tiny incidental area of ischemia LEFT occipital  lobe, of no clinical significance.  Early right-to-left shift of 3 mm.  Patency of the proximal vasculature is been re-established with endovascular treatment.   Electronically Signed   By: Rolla Flatten M.D.   On: 02/20/2015 18:01    Scheduled Meds: .  stroke: mapping our early stages of recovery book   Does not apply Once  . antiseptic oral rinse  7 mL Mouth Rinse QID  . aspirin EC  325 mg Oral Daily  . atorvastatin  40 mg Oral q1800  . chlorhexidine  15 mL Mouth Rinse BID  . enoxaparin (LOVENOX) injection  40 mg Subcutaneous Q24H  . pantoprazole  40 mg Oral Daily   Continuous Infusions:   Active Problems:   CVA (cerebral infarction)   Stroke with cerebral ischemia    Time spent: 30 min    Antron Seth, [facility] Pager [phone]. If 7PM-7AM, please contact night-coverage at [email or web address], password [facility] 02/21/2015, 4:36 PM  LOS: 2 days

## 2015-02-21 NOTE — Progress Notes (Signed)
STROKE TEAM PROGRESS NOTE   SUBJECTIVE (INTERVAL HISTORY) His daughter is at the bedside.  Overall he feels his condition is rapidly improving. He is able to move left UE and LE spontaneously with better strength. Still intubated and pending extubation and MRI. Has urinary rentention and bladder scan 400. Currently will hold off foley, if more than 500 and pt is not able to urinate then consider foley.  OBJECTIVE Temp:  [97.5 F (36.4 C)-99.1 F (37.3 C)] 97.5 F (36.4 C) (05/24 1330) Pulse Rate:  [33-56] 36 (05/24 1330) Cardiac Rhythm:  [-] Sinus bradycardia (05/24 1125) Resp:  [12-21] 20 (05/24 1330) BP: (115-155)/(45-94) 143/63 mmHg (05/24 1330) SpO2:  [88 %-100 %] 96 % (05/24 1330) Arterial Line BP: (78-193)/(56-118) 136/57 mmHg (05/24 0800)   Recent Labs Lab 02/19/15 1828 02/20/15 2356 02/21/15 1152  GLUCAP 103* 98 133*    Recent Labs Lab 02/19/15 1830 02/19/15 1842 02/20/15 0512  NA 140 141 136  K 4.0 3.9 3.9  CL 104 104 103  CO2 26  --  19*  GLUCOSE 105* 106* 226*  BUN 13 16 15   CREATININE 1.12 1.10 1.37*  CALCIUM 9.3  --  8.3*    Recent Labs Lab 02/19/15 1830  AST 23  ALT 14*  ALKPHOS 97  BILITOT 1.1  PROT 6.9  ALBUMIN 3.8    Recent Labs Lab 02/19/15 1830 02/19/15 1842 02/20/15 0512  WBC 6.4  --  13.3*  NEUTROABS 3.3  --  12.0*  HGB 17.5* 18.7* 15.5  HCT 51.3 55.0* 45.6  MCV 92.1  --  91.6  PLT 159  --  170    Recent Labs Lab 02/20/15 0953 02/20/15 1524 02/20/15 2100  TROPONINI 0.08* 0.06* 0.07*    Recent Labs  02/19/15 1830  LABPROT 15.5*  INR 1.21   No results for input(s): COLORURINE, LABSPEC, PHURINE, GLUCOSEU, HGBUR, BILIRUBINUR, KETONESUR, PROTEINUR, UROBILINOGEN, NITRITE, LEUKOCYTESUR in the last 72 hours.  Invalid input(s): APPERANCEUR     Component Value Date/Time   CHOL 114 02/20/2015 0511   TRIG 71 02/20/2015 0511   HDL 44 02/20/2015 0511   CHOLHDL 2.6 02/20/2015 0511   VLDL 14 02/20/2015 0511   LDLCALC 56  02/20/2015 0511   Lab Results  Component Value Date   HGBA1C 6.1* 02/20/2015   No results found for: LABOPIA, [place], [place], [place], THCU, [place]   Recent Labs Lab 02/19/15 [place] <5    I have personally reviewed the radiological images below and agree with the radiology interpretations.  Ct Head Wo Contrast  02/19/2015  IMPRESSION: Evolving right MCA distribution infarct without evidence of hemorrhagic conversion or mass effect/midline shift.    Ct Head Wo Contrast  02/19/2015   IMPRESSION: Findings concerning for early RIGHT middle cerebral artery territory infarct which could be confirmed on MRI of the brain with diffusion-weighted sequences as clinically indicated. There is a component of probable hemoconcentration.  Otherwise normal noncontrast CT of the head for age.   MRI and MRA  Extensive area of acute infarction affecting most of the RIGHT middle cerebral artery territory, including the basal ganglia. No hemorrhagic transformation. Tiny incidental area of ischemia LEFT occipital lobe, of no clinical significance. Early right-to-left shift of 3 mm. Patency of the proximal vasculature is been re-established with endovascular treatment.  2D Echocardiogram - Left ventricle: The cavity size was moderately dilated. There was mild focal basal hypertrophy of the septum. Systolic function was severely reduced. The estimated ejection fraction was in the range of  25% to 30%. Diffuse hypokinesis. Features are consistent with a pseudonormal left ventricular filling pattern, with concomitant abnormal relaxation and increased filling pressure (grade 2 diastolic dysfunction). Doppler parameters are consistent with elevated ventricular end-diastolic filling pressure. - Aortic valve: Moderately thickened, moderately calcified leaflets. Transvalvular velocity was within the normal range. There was no stenosis. There was trivial regurgitation. -  Aortic root: The aortic root was normal in size. - Ascending aorta: The ascending aorta was normal in size. - Mitral valve: Calcified annulus. Mildly thickened leaflets . There was moderate regurgitation. - Left atrium: The atrium was mildly dilated. - Right ventricle: The cavity size was normal. Wall thickness was normal. Systolic function was normal. - Right atrium: The atrium was moderately dilated. - Tricuspid valve: There was moderate regurgitation. - Pulmonary arteries: Systolic pressure was severely increased. PA peak pressure: 60 mm Hg (S).  CXR - 02/20/15 Endotracheal tube is appropriately positioned above the carina. Suspect old granulomatous disease.  EKG  sinus bradycardia. For complete results please see formal report.  PHYSICAL EXAM  Temp:  [97.5 F (36.4 C)-99.1 F (37.3 C)] 97.5 F (36.4 C) (05/24 1330) Pulse Rate:  [33-56] 36 (05/24 1330) Resp:  [12-21] 20 (05/24 1330) BP: (115-155)/(45-94) 143/63 mmHg (05/24 1330) SpO2:  [88 %-100 %] 96 % (05/24 1330) Arterial Line BP: (78-193)/(56-118) 136/57 mmHg (05/24 0800)  Physical exam  Temp:  [97.5 F (36.4 C)-99.1 F (37.3 C)] 97.5 F (36.4 C) (05/24 1330) Pulse Rate:  [33-56] 36 (05/24 1330) Resp:  [12-21] 20 (05/24 1330) BP: (115-155)/(45-94) 143/63 mmHg (05/24 1330) SpO2:  [88 %-100 %] 96 % (05/24 1330) Arterial Line BP: (78-193)/(56-118) 136/57 mmHg (05/24 0800)  General - Well nourished, well developed, in no apparent distress.  Ophthalmologic - Fundi not visualized due to noncooperation.  Cardiovascular - bradycardia, regular rhythm.  Mental Status -  Awake alert orientated to place and people but not to time or age. Language including expression, naming, repetition, comprehension was assessed and found intact.  Cranial Nerves II - XII - II - Visual field intact OU. Left eyelid apraxia. III, IV, VI - Extraocular movements intact. V - Facial sensation intact bilaterally. VII - left facial  droop. VIII - Hearing & vestibular intact bilaterally. X - Palate elevates symmetrically. XI - Chin turning & shoulder shrug intact bilaterally. XII - Tongue protrusion intact.  Motor Strength - The patient's strength was 4/5 LUE and LLE with pronator drift on the left.  Bulk was normal and fasciculations were absent.   Motor Tone - Muscle tone was assessed at the neck and appendages and was normal.  Reflexes - The patient's reflexes were 1+ in all extremities and he had no pathological reflexes.  Sensory - Light touch, temperature/pinprick were assessed and were symmetrical.    Coordination - The patient had normal movements in the hands with no ataxia or dysmetria.  Tremor was absent.  Gait and Station - deferred due to safety concerns.   ASSESSMENT/PLAN Parker [name] is a 81 y.o. male with history of CAD s/p stenting admitted for aphasia and left sided weakness. Received tPA and mechanical thrombectomy. Symptoms rapid improving.    Stroke:  Non-dominant right large MCA territory infarct as well as left occipital small infarct, embolic secondary to unknown source. Likely due to cardiomyopathy with low EF. S/p tPA and intervention.   MRI  Right large MCA infarct as well as left occipital small infarct  MRA  Right M2 stenosis  Cerebral angio showed right M1 cut  off. After thrombectomy, TICI 3 recannulization.  2D Echo  EF 25-30%  LE venous doppler pending  LDL 56  HgbA1c 6.1  lovenox subq for VTE prophylaxis  Diet regular Room service appropriate?: Yes; Fluid consistency:: Thin   aspirin 81 mg orally every day prior to admission, now on ASA 325mg .  May need to consider TEE and loop recorder to rule out afib  Ongoing aggressive stroke risk factor management  Therapy recommendations:  pending  Disposition:  Transfer to floor  Cardiomyopathy  EF 25-30%  Also bradycardia  Pt seems to have cardiologist in [place]  Was offered pacer in the past  IM on  board, my need cardiology consult  Hypertension  Home meds:   Lisinopril   BP goal - gradually normotensive  Stable  Currently on no BP meds  Patient counseled to be compliant with his blood pressure medications  Hyperlipidemia  Home meds:  zocor 20   On lipitor 40mg  now  LDL 56, goal < 70  Continue statin when po access  CAD s/p stenting  Trend troponin - down trending, not elevated  On ASA 325  Bradycardia   HR at 30s  Asymptomatic  IM on board, may consider cardiology consult  Pt was offered pacer as outpt in the past but he refused.  Other Stroke Risk Factors  Advanced age  Coronary artery disease  Other Active Problems  Mild leukocytosis  Other Pertinent History    Hospital day # 2   Rosalin Hawking, MD PhD Stroke Neurology 02/21/2015 3:32 PM    To contact Stroke Continuity provider, please refer to [email or web address] After hours, contact General Neurology

## 2015-02-21 NOTE — Evaluation (Signed)
Physical Therapy Evaluation Patient Details Name: Parker [name] MRN: [id number] DOB: Apr 13, 1927 Today's Date: 02/21/2015   History of Present Illness  pt is 81 y/o male admitted with s/s of stroke including L sided weakness, decr responsiveness, R gaze preference.  pt able to receive IV Tpa and also sent to IR for clot retreival with result of comprlete revascularization.  PMHx: MI  Clinical Impression  Pt admitted with/for s/s of stroke.  Tpa given and pt was revascularized in IR, but deficits remain  Pt currently limited functionally due to the problems listed. ( See problems list.)   Pt will benefit from PT to maximize function and safety in order to get ready for next venue listed below.     Follow Up Recommendations CIR    Equipment Recommendations  None recommended by PT;Other (comment) (TBA)    Recommendations for Other Services Rehab consult     Precautions / Restrictions Precautions Precautions: Fall      Mobility  Bed Mobility Overal bed mobility: Needs Assistance Bed Mobility: Supine to Sit     Supine to sit: Min assist     General bed mobility comments: Pt slow to intiate movement due to unable to understand the task "sit up at EOB right here"  TC to guide and minimal steady assist  Transfers Overall transfer level: Needs assistance   Transfers: Sit to/from Stand Sit to Stand: Min assist         General transfer comment: steady assist; vc and demo. for hand placement  Ambulation/Gait Ambulation/Gait assistance: Min assist Ambulation Distance (Feet): 80 Feet Assistive device: None Gait Pattern/deviations: Step-through pattern;Decreased step length - right;Decreased step length - left;Trunk flexed;Wide base of support Gait velocity: slower   General Gait Details: mildly unsteady gait through out.  pt grabbing for the wall, people for stability  Stairs            Wheelchair Mobility    Modified Rankin (Stroke Patients Only) Modified Rankin  (Stroke Patients Only) Pre-Morbid Rankin Score: No symptoms Modified Rankin: Moderately severe disability     Balance Overall balance assessment: Needs assistance Sitting-balance support: No upper extremity supported;Feet supported Sitting balance-Leahy Scale: Fair     Standing balance support: No upper extremity supported Standing balance-Leahy Scale: Fair Standing balance comment: Once stable, he could maintain stand and guard of safety.                             Pertinent Vitals/Pain Pain Assessment: No/denies pain    Home Living Family/patient expects to be discharged to:: Private residence Living Arrangements: Spouse/significant other Available Help at Discharge: Family Type of Home: Other(Comment) (Condo) Home Access: Elevator     Home Layout: One level Home Equipment: None;Other (comment) (maybe a cane)      Prior Function Level of Independence: Independent         Comments: Independent, drove, ran errands, etc.     Hand Dominance   Dominant Hand: Right    Extremity/Trunk Assessment   Upper Extremity Assessment: Defer to OT evaluation           Lower Extremity Assessment: RLE deficits/detail;LLE deficits/detail RLE Deficits / Details: mild weakness may be expained by distractability LLE Deficits / Details: mildly weaker and less coordinated than R, but still difficult to MMT due to decreased ability to follow direction.     Communication   Communication: No difficulties;Other (comment) (?mild dysarthria.)  Cognition Arousal/Alertness: Awake/alert Behavior During Therapy:  Impulsive Overall Cognitive Status: Impaired/Different from baseline Area of Impairment: Attention;Following commands;Safety/judgement;Problem solving   Current Attention Level: Sustained Memory: Decreased short-term memory;Decreased recall of precautions Following Commands: Follows one step commands inconsistently;Follows one step commands with increased  time Safety/Judgement: Decreased awareness of safety;Decreased awareness of deficits   Problem Solving: Slow processing;Decreased initiation;Difficulty sequencing;Requires verbal cues;Requires tactile cues General Comments: Very distractable and mildly impulsive especially as it pertains to distractability.  But slowed processinb and decreased ability to sequence makes it appear that he has trouble with initiation.    General Comments      Exercises        Assessment/Plan    PT Assessment Patient needs continued PT services  PT Diagnosis Abnormality of gait;Generalized weakness;Other (comment) (cognitive changes)   PT Problem List Decreased strength;Decreased activity tolerance;Decreased balance;Decreased mobility;Decreased cognition;Decreased safety awareness;Decreased knowledge of precautions  PT Treatment Interventions Gait training;Functional mobility training;Therapeutic activities;Balance training;Neuromuscular re-education;Cognitive remediation;Patient/family education   PT Goals (Current goals can be found in the Care Plan section) Acute Rehab PT Goals Patient Stated Goal: independent PT Goal Formulation: With patient Time For Goal Achievement: 03/07/15 Potential to Achieve Goals: Good    Frequency Min 3X/week   Barriers to discharge        Co-evaluation               End of Session   Activity Tolerance: Patient tolerated treatment well Patient left: in chair;with call bell/phone within reach;with family/visitor present Nurse Communication: Mobility status         Time: 8341-9622 PT Time Calculation (min) (ACUTE ONLY): 27 min   Charges:   PT Evaluation $Initial PT Evaluation Tier I: 1 Procedure PT Treatments $Gait Training: 8-22 mins   PT G Codes:        Kanylah Muench, Tessie Fass 02/21/2015, 12:52 PM 02/21/2015  Donnella Sham, PT [phone]  (pager)

## 2015-02-22 ENCOUNTER — Encounter (HOSPITAL_COMMUNITY)
Admission: EM | Disposition: A | Discharge status: Rehabilitation Facility | Payer: Medicare PPO | Source: Home / Self Care | Attending: Neurology

## 2015-02-22 DIAGNOSIS — R414 Neurologic neglect syndrome: Secondary | ICD-10-CM

## 2015-02-22 DIAGNOSIS — G8194 Hemiplegia, unspecified affecting left nondominant side: Secondary | ICD-10-CM

## 2015-02-22 DIAGNOSIS — I69354 Hemiplegia and hemiparesis following cerebral infarction affecting left non-dominant side: Secondary | ICD-10-CM

## 2015-02-22 DIAGNOSIS — I480 Paroxysmal atrial fibrillation: Secondary | ICD-10-CM

## 2015-02-22 DIAGNOSIS — I63511 Cerebral infarction due to unspecified occlusion or stenosis of right middle cerebral artery: Secondary | ICD-10-CM | POA: Diagnosis present

## 2015-02-22 DIAGNOSIS — I5022 Chronic systolic (congestive) heart failure: Secondary | ICD-10-CM

## 2015-02-22 DIAGNOSIS — R4189 Other symptoms and signs involving cognitive functions and awareness: Secondary | ICD-10-CM

## 2015-02-22 DIAGNOSIS — H53462 Homonymous bilateral field defects, left side: Secondary | ICD-10-CM | POA: Diagnosis present

## 2015-02-22 DIAGNOSIS — I442 Atrioventricular block, complete: Secondary | ICD-10-CM | POA: Diagnosis present

## 2015-02-22 HISTORY — PX: EP IMPLANTABLE DEVICE: SHX172B

## 2015-02-22 LAB — BASIC METABOLIC PANEL
ANION GAP: 10 (ref 5–15)
BUN: 12 mg/dL (ref 6–20)
CO2: 25 mmol/L (ref 22–32)
Calcium: 8.8 mg/dL — ABNORMAL LOW (ref 8.9–10.3)
Chloride: 103 mmol/L (ref 101–111)
Creatinine, Ser: 0.96 mg/dL (ref 0.61–1.24)
GFR calc Af Amer: >60 mL/min (ref >60)
GFR calc non Af Amer: >60 mL/min (ref >60)
Glucose, Bld: 114 mg/dL — ABNORMAL HIGH (ref 65–99)
POTASSIUM: 3.7 mmol/L (ref 3.5–5.1)
Sodium: 138 mmol/L (ref 135–145)

## 2015-02-22 LAB — CBC
HCT: 44.9 % (ref 39.0–52.0)
Hemoglobin: 14.9 g/dL (ref 13.0–17.0)
MCH: 31 pg (ref 26.0–34.0)
MCHC: 33.2 g/dL (ref 30.0–36.0)
MCV: 93.3 fL (ref 78.0–100.0)
Platelets: 147 10*3/uL — ABNORMAL LOW (ref 150–400)
RBC: 4.81 MIL/uL (ref 4.22–5.81)
RDW: 14.5 % (ref 11.5–15.5)
WBC: 7.7 10*3/uL (ref 4.0–10.5)

## 2015-02-22 SURGERY — BIV PACEMAKER INSERTION CRT-P

## 2015-02-22 MED ORDER — HEPARIN (PORCINE) IN NACL 2-0.9 UNIT/ML-% IJ SOLN
INTRAMUSCULAR | Status: AC
  Filled 2015-02-22: qty 500

## 2015-02-22 MED ORDER — LIDOCAINE HCL (PF) 1 % IJ SOLN
INTRAMUSCULAR | Status: AC
Start: 2015-02-22 — End: 2015-02-22
  Filled 2015-02-22: qty 60

## 2015-02-22 MED ORDER — LIDOCAINE HCL (PF) 1 % IJ SOLN
INTRAMUSCULAR | Status: DC | PRN
  Administered 2015-02-22: 35 mL via SUBCUTANEOUS

## 2015-02-22 MED ORDER — CEFAZOLIN SODIUM 1-5 GM-% IV SOLN
1.0000 g | Freq: Four times a day (QID) | INTRAVENOUS | Status: AC
  Administered 2015-02-22 – 2015-02-23 (×3): 1 g via INTRAVENOUS
  Filled 2015-02-22 (×3): qty 50

## 2015-02-22 MED ORDER — IOHEXOL 350 MG/ML SOLN
INTRAVENOUS | Status: DC | PRN
  Administered 2015-02-22: 15 mg via INTRAVENOUS

## 2015-02-22 MED ORDER — FENTANYL CITRATE (PF) 100 MCG/2ML IJ SOLN
INTRAMUSCULAR | Status: DC | PRN
  Administered 2015-02-22 (×3): 12.5 ug via INTRAVENOUS

## 2015-02-22 MED ORDER — ACETAMINOPHEN 325 MG PO TABS
325.0000 mg | ORAL_TABLET | ORAL | Status: DC | PRN
  Administered 2015-02-23: 11:00:00 650 mg via ORAL
  Filled 2015-02-22: qty 2

## 2015-02-22 MED ORDER — SODIUM CHLORIDE 0.9 % IR SOLN
80.0000 mg | Status: AC
  Administered 2015-02-22: 80 mg
  Filled 2015-02-22: qty 2

## 2015-02-22 MED ORDER — SODIUM CHLORIDE 0.9 % IV SOLN
INTRAVENOUS | Status: DC
  Administered 2015-02-22: 16:00:00 via INTRAVENOUS

## 2015-02-22 MED ORDER — FENTANYL CITRATE (PF) 100 MCG/2ML IJ SOLN
INTRAMUSCULAR | Status: AC
  Filled 2015-02-22: qty 2

## 2015-02-22 MED ORDER — CEFAZOLIN SODIUM-DEXTROSE 2-3 GM-% IV SOLR
2.0000 g | INTRAVENOUS | Status: AC
  Administered 2015-02-22: 2 g via INTRAVENOUS
  Filled 2015-02-22: qty 50

## 2015-02-22 MED ORDER — OFF THE BEAT BOOK
Freq: Once | Status: AC
  Administered 2015-02-22: 23:00:00
  Filled 2015-02-22: qty 1

## 2015-02-22 MED ORDER — ONDANSETRON HCL 4 MG/2ML IJ SOLN
4.0000 mg | Freq: Four times a day (QID) | INTRAMUSCULAR | Status: DC | PRN
  Administered 2015-02-22: 21:00:00 4 mg via INTRAVENOUS

## 2015-02-22 MED ORDER — LISINOPRIL 10 MG PO TABS
20.0000 mg | ORAL_TABLET | Freq: Every day | ORAL | Status: DC
  Administered 2015-02-22 – 2015-02-24 (×3): 20 mg via ORAL
  Filled 2015-02-22 (×3): qty 2

## 2015-02-22 SURGICAL SUPPLY — 16 items
CABLE SURGICAL S-101-97-12 (CABLE) ×3 IMPLANT
CATH CPS DIRECT 135 DS2C020 (CATHETERS) ×3 IMPLANT
CATH HEX JOSEPH 2-5-2 65CM 6F (CATHETERS) ×3 IMPLANT
LEAD QUARTET 1458Q-86CM (Lead) ×3 IMPLANT
LEAD TENDRIL SDX 1688TC-52CM (Lead) ×3 IMPLANT
LEAD TENDRIL ST 1888TC-58CM (Lead) ×3 IMPLANT
PACEMAKER ALLR QDR CRTP PM3242 (Pacemaker) ×1 IMPLANT
PAD DEFIB LIFELINK (PAD) ×3 IMPLANT
PPM ALLURE QUADRA CRT-P PM3242 (Pacemaker) ×3 IMPLANT
SHEATH CLASSIC 7F (SHEATH) IMPLANT
SHEATH CLASSIC 8F (SHEATH) IMPLANT
SHEATH CLASSIC 9.5F (SHEATH) IMPLANT
SHEATH CLASSIC 9F (SHEATH) IMPLANT
SHEATH COOK PEEL AWAY SET 7F (SHEATH) ×6 IMPLANT
TRAY PACEMAKER INSERTION (CUSTOM PROCEDURE TRAY) ×3 IMPLANT
WIRE ACUITY WHISPER EDS 4648 (WIRE) ×3 IMPLANT

## 2015-02-22 NOTE — Progress Notes (Signed)
Physical Therapy Treatment Patient Details Name: Parker [name] MRN: [id number] DOB: 11/08/1926 Today's Date: 02/22/2015    History of Present Illness 81 yo male admitted with S/s of CVA including L side weakness, decr responsiveness, R gaze prefernce. Pt recieved TpA and IR clot retreival with complete revascularization. MRI (+) R MCA including basal ganglia; L occipital infarct PMH: MI    PT Comments    Pt con't to have mild confusion, decreased awareness to deficits and safety, decreased activity tolerance and impaired balance. Pt's ambulation tolerance did improve this date. Con't to recommend CIR.  Follow Up Recommendations  CIR     Equipment Recommendations  None recommended by PT;Other (comment)    Recommendations for Other Services Rehab consult     Precautions / Restrictions Precautions Precautions: Fall Restrictions Weight Bearing Restrictions: No    Mobility  Bed Mobility Overal bed mobility: Needs Assistance Bed Mobility: Supine to Sit;Sit to Supine     Supine to sit: Mod assist Sit to supine: Min assist (max directional cues on how to scoot up in bed)   General bed mobility comments: pt reaching for PT to assist him, assist for trunk elevation  Transfers Overall transfer level: Needs assistance Equipment used: Rolling walker (2 wheeled) Transfers: Sit to/from Stand Sit to Stand: Min assist         General transfer comment: cue for safety wtih RW  Ambulation/Gait Ambulation/Gait assistance: Min assist Ambulation Distance (Feet): 150 Feet Assistive device: Rolling walker (2 wheeled) Gait Pattern/deviations: Step-through pattern;Staggering right;Staggering left;Narrow base of support Gait velocity: slower   General Gait Details: attempted to amb without AD however pt extremely usnteady with short shuffled steps and staggering. once give RW pt with icnreased stabiltiy however required max v/c's for safe manipulation of walker especially during  turns   Stairs            Wheelchair Mobility    Modified Rankin (Stroke Patients Only) Modified Rankin (Stroke Patients Only) Pre-Morbid Rankin Score: No symptoms Modified Rankin: Moderately severe disability     Balance           Standing balance support: No upper extremity supported   Standing balance comment: requires RW for safe amb                    Cognition Arousal/Alertness: Awake/alert Behavior During Therapy: Impulsive Overall Cognitive Status: Impaired/Different from baseline Area of Impairment: Safety/judgement;Attention;Orientation;Memory;Following commands;Problem solving;Awareness Orientation Level:  (was aware he was at [facility] and it was May 2016) Current Attention Level: Sustained Memory: Decreased short-term memory;Decreased recall of precautions Following Commands: Follows one step commands with increased time Safety/Judgement: Decreased awareness of safety;Decreased awareness of deficits Awareness: Emergent Problem Solving: Slow processing General Comments: pt with poor carryover however can repeat instructions ie. required repeated v/c's to stay in walker. pt unable to stay in walker but kept repeating "stay in walker"    Exercises      General Comments        Pertinent Vitals/Pain Pain Assessment: 0-10 Pain Score: 5  Pain Location: R eyeball pain Pain Descriptors / Indicators: Dull Pain Intervention(s): Monitored during session    Home Living                      Prior Function            PT Goals (current goals can now be found in the care plan section) Progress towards PT goals: Progressing toward goals    Frequency  Min 3X/week    PT Plan Current plan remains appropriate    Co-evaluation             End of Session Equipment Utilized During Treatment: Gait belt Activity Tolerance: Patient tolerated treatment well Patient left: in bed;with call bell/phone within reach;with nursing/sitter in  room     Time: 1222-1242 PT Time Calculation (min) (ACUTE ONLY): 20 min  Charges:  $Gait Training: 8-22 mins                    G Codes:      Parker [name] 02/22/2015, 1:40 PM   Parker [name], PT, DPT Pager #: [phone] Office #: [phone]

## 2015-02-22 NOTE — Progress Notes (Signed)
STROKE TEAM PROGRESS NOTE   SUBJECTIVE (INTERVAL HISTORY) His daughters/famiy are at the bedside. He was found to have afib with III degree AV block. Patient is agreeable to pacer if he needs it:  "just dig in me". Cardiology to see.  OBJECTIVE Temp:  [97.5 F (36.4 C)-98.6 F (37 C)] 97.7 F (36.5 C) (05/25 0938) Pulse Rate:  [36-85] 85 (05/25 0938) Cardiac Rhythm:  [-] Sinus bradycardia (05/24 2000) Resp:  [18-20] 18 (05/25 0938) BP: (115-169)/(55-93) 164/79 mmHg (05/25 0938) SpO2:  [96 %-100 %] 99 % (05/25 0938)   Recent Labs Lab 02/19/15 1828 02/20/15 2356 02/21/15 1152  GLUCAP 103* 98 133*    Recent Labs Lab 02/19/15 1830 02/19/15 1842 02/20/15 0512 02/22/15 0400  NA 140 141 136 138  K 4.0 3.9 3.9 3.7  CL 104 104 103 103  CO2 26  --  19* 25  GLUCOSE 105* 106* 226* 114*  BUN 13 16 15 12   CREATININE 1.12 1.10 1.37* 0.96  CALCIUM 9.3  --  8.3* 8.8*    Recent Labs Lab 02/19/15 1830  AST 23  ALT 14*  ALKPHOS 97  BILITOT 1.1  PROT 6.9  ALBUMIN 3.8    Recent Labs Lab 02/19/15 1830 02/19/15 1842 02/20/15 0512 02/22/15 0400  WBC 6.4  --  13.3* 7.7  NEUTROABS 3.3  --  12.0*  --   HGB 17.5* 18.7* 15.5 14.9  HCT 51.3 55.0* 45.6 44.9  MCV 92.1  --  91.6 93.3  PLT 159  --  170 147*    Recent Labs Lab 02/20/15 0953 02/20/15 1524 02/20/15 2100  TROPONINI 0.08* 0.06* 0.07*    Recent Labs  02/19/15 1830  LABPROT 15.5*  INR 1.21   No results for input(s): COLORURINE, LABSPEC, PHURINE, GLUCOSEU, HGBUR, BILIRUBINUR, KETONESUR, PROTEINUR, UROBILINOGEN, NITRITE, LEUKOCYTESUR in the last 72 hours.  Invalid input(s): APPERANCEUR     Component Value Date/Time   CHOL 114 02/20/2015 0511   TRIG 71 02/20/2015 0511   HDL 44 02/20/2015 0511   CHOLHDL 2.6 02/20/2015 0511   VLDL 14 02/20/2015 0511   LDLCALC 56 02/20/2015 0511   Lab Results  Component Value Date   HGBA1C 6.1* 02/20/2015   No results found for: LABOPIA, [place], [place],  [place], [place], [place]   Recent Labs Lab 02/19/15 1830  ETH <5    Ct Head Wo Contrast 02/19/2015  IMPRESSION: Evolving right MCA distribution infarct without evidence of hemorrhagic conversion or mass effect/midline shift.   02/19/2015   IMPRESSION: Findings concerning for early RIGHT middle cerebral artery territory infarct which could be confirmed on MRI of the brain with diffusion-weighted sequences as clinically indicated. There is a component of probable hemoconcentration.  Otherwise normal noncontrast CT of the head for age.   MRI and MRA  Extensive area of acute infarction affecting most of the RIGHT middle cerebral artery territory, including the basal ganglia. No hemorrhagic transformation. Tiny incidental area of ischemia LEFT occipital lobe, of no clinical significance. Early right-to-left shift of 3 mm. Patency of the proximal vasculature is been re-established with endovascular treatment.  2D Echocardiogram  - Left ventricle: The cavity size was moderately dilated. There was mild focal basal hypertrophy of the septum. Systolic function wasseverely reduced. The estimated ejection fraction was in therange of 25% to 30%. Diffuse hypokinesis. Features are consistent with a pseudonormal left ventricular filling pattern, withconcomitant abnormal relaxation and increased filling pressure(grade 2 diastolic dysfunction). Doppler parameters areconsistent with elevated ventricular end-diastolic fillingpressure. - Aortic valve: Moderately thickened,  moderately calcified leaflets. Transvalvular velocity was within the normal range. There was no stenosis. There was trivial regurgitation. - Aortic root: The aortic root was normal in size. - Ascending aorta: The ascending aorta was normal in size. - Mitral valve: Calcified annulus. Mildly thickened leaflets . There was moderate regurgitation. - Left atrium: The atrium was mildly dilated. - Right ventricle: The cavity size was  normal. Wall thickness was normal. Systolic function was normal. - Right atrium: The atrium was moderately dilated. - Tricuspid valve: There was moderate regurgitation. - Pulmonary arteries: Systolic pressure was severely increased. PA peak pressure: 60 mm Hg (S).  CXR - 02/20/15 Endotracheal tube is appropriately positioned above the carina. Suspect old granulomatous disease.  EKG  sinus bradycardia. For complete results please see formal report.   PHYSICAL EXAM General - Well nourished, well developed, in no apparent distress.  Ophthalmologic - Fundi not visualized due to noncooperation.  Cardiovascular - bradycardia, regular rhythm.  Mental Status -  Awake alert orientated to place and people but not to time or age. Language including expression, naming, repetition, comprehension was assessed and found intact.  Cranial Nerves II - XII - II - Visual field intact OU. Left eyelid apraxia. III, IV, VI - Extraocular movements intact. V - Facial sensation intact bilaterally. VII - left facial droop. VIII - Hearing & vestibular intact bilaterally. X - Palate elevates symmetrically. XI - Chin turning & shoulder shrug intact bilaterally. XII - Tongue protrusion intact.  Motor Strength - The patient's strength was 4/5 LUE and LLE with pronator drift on the left.  Bulk was normal and fasciculations were absent.   Motor Tone - Muscle tone was assessed at the neck and appendages and was normal.  Reflexes - The patient's reflexes were 1+ in all extremities and he had no pathological reflexes.  Sensory - Light touch, temperature/pinprick were assessed and were symmetrical.    Coordination - The patient had normal movements in the hands with no ataxia or dysmetria.  Tremor was absent.  Gait and Station - deferred due to safety concerns.   ASSESSMENT/PLAN [name] is a 81 y.o. male with history of CAD s/p stenting admitted for aphasia and left sided weakness. Received tPA and  mechanical thrombectomy. Symptoms rapid improving.    Stroke:  Non-dominant right large MCA territory infarct as well as left occipital small infarct, embolic secondary to atrial fibrillation. S/p tPA and intervention.   MRI  Right large MCA infarct as well as left occipital small infarct  MRA  Right M2 stenosis  Cerebral angio showed right M1 cut off. After thrombectomy, TICI 3 recannulization.  2D Echo  EF 25-30%  LE venous doppler canceled given atrial fibrillation diagnosis  LDL 56  HgbA1c 6.1  lovenox subq for VTE prophylaxis  Diet Heart Room service appropriate?: Yes; Fluid consistency:: Thin   aspirin 81 mg orally every day prior to admission, now on ASA 325mg . Will need to start anticoagulation in 5-7 days due to large stroke and high risk of hemorrhagic transformation which also give time for pacemaker placement.  Ongoing aggressive stroke risk factor management  Therapy recommendations:  CIR. Consult pending.  Disposition:  pending   Atrial fibrillation with complete AV block   HR at 30s  Asymptomatic  Pt was offered pacer as outpt in the past but he refused.  Will need to start anticoagulation in 5-7 days due to large stroke and high risk of hemorrhagic transformation which also give time for pacemaker placement.  Cardiomyopathy with complete AV block  EF 25-30%  Pt followed cardiologist in [place]  Was offered pacer in the past that he refused. He is now agreeable to placement.  EP cardiology consult done. They will place pacer. No TEE given atrial fibrillation.   Hypertension  Home meds:   Lisinopril   BP goal - gradually normotensive  Stable  Home meds resumed.  Hyperlipidemia  Home meds:  zocor 20   On lipitor 40mg  now  LDL 56, goal < 70  Continue statin at discharge  CAD s/p stenting  Trend troponin - down trending, not elevated  On ASA 325 for now  Other Stroke Risk Factors  Advanced age  Coronary artery  disease  Other Active Problems  Mild leukocytosis  Urinary retention. Foley placed 5/24. Will do trial voids in a day or so. Await pacer.   Hospital day # [place] for Pager information 02/22/2015 10:19 AM   I, the attending vascular neurologist, have personally obtained a history, examined the patient, evaluated laboratory data, individually viewed imaging studies and agree with radiology interpretations. I obtained additional history from pt's wife and daughters at bedside. I also discussed with pt and wife and daughters and cardiology service regarding his care plan. Together with the NP/PA, we formulated the assessment and plan of care which reflects our mutual decision.  I have made any additions or clarifications directly to the above note and agree with the findings and plan as currently documented.   81 yo M with hx of CAD, bradycardia, HTN admitted for right MCA stroke. S/p tPA and IR with TICI 3 recannulization. MRI showed large right MCA stroke. Pt functional recovery is very good and pending CIR placement. Found to have Afib with complete AV block, will place pacemaker by cardiology. Will start anticoagulation in 5-7 days due to large infarct. Continue ASA and statin now. Add lisinopril for HTN.  Rosalin Hawking, MD PhD Stroke Neurology 02/22/2015 2:02 PM   To contact Stroke Continuity provider, please refer to [email or web address] After hours, contact General Neurology

## 2015-02-22 NOTE — Evaluation (Signed)
Occupational Therapy Evaluation Patient Details Name: Parker [name] MRN: [id number] DOB: 08-19-1927 Today's Date: 02/22/2015    History of Present Illness 81 yo male admitted with S/s of CVA including L side weakness, decr responsiveness, R gaze prefernce. Pt recieved TpA and IR clot retreival with complete revascularization. MRI (+) R MCA including basal ganglia; L occipital infarct PMH: MI   Clinical Impression   PT admitted with R MCA including Basal ganglia and L occipital infarct. Pt currently with functional limitiations due to the deficits listed below (see OT problem list). PTA independent and driving. Pt will benefit from skilled OT to increase their independence and safety with adls and balance to allow discharge CIR. Pt with L inattention, L UE weakness, cognitive deficits and balance deficits.      Follow Up Recommendations  CIR    Equipment Recommendations  3 in 1 bedside comode;Other (comment) (RW)    Recommendations for Other Services Rehab consult     Precautions / Restrictions Precautions Precautions: Fall      Mobility Bed Mobility Overal bed mobility: Needs Assistance Bed Mobility: Supine to Sit     Supine to sit: Mod assist     General bed mobility comments: cues to sequence and (A) to progress to EOB  Transfers Overall transfer level: Needs assistance Equipment used: Rolling walker (2 wheeled) Transfers: Sit to/from Stand Sit to Stand: Min assist         General transfer comment: cue for safety wtih RW    Balance Overall balance assessment: Needs assistance Sitting-balance support: Bilateral upper extremity supported;Feet supported Sitting balance-Leahy Scale: Fair                                      ADL Overall ADL's : Needs assistance/impaired Eating/Feeding: Minimal assistance;Cueing for safety;Sitting Eating/Feeding Details (indicate cue type and reason): pt reports "feels lumpy". Pt requesting additional options.  Pt provided cereal. Pt eating cereal dry initally and OT cued to add milk. Pt taking large bites Pt holding cereal in L UE and tilting bowl with no awareness to L UE drifting. Pt with poor awareness to L UE in space. Grooming: Wash/dry hands;Minimal assistance;Sitting       Lower Body Bathing: Moderate assistance;Sit to/from stand           Toilet Transfer: Moderate assistance;Ambulation;RW;BSC Toilet Transfer Details (indicate cue type and reason): cues for safety. pt requesting toilet transfer with foley in place. Pt  not voiding bowels          Functional mobility during ADLs: Moderate assistance;Rolling walker       Vision Vision Assessment?: Yes;Vision impaired- to be further tested in functional context Eye Alignment: Within Functional Limits Ocular Range of Motion: Impaired-to be further tested in functional context Tracking/Visual Pursuits: Impaired - to be further tested in functional context;Unable to hold eye position out of midline;Requires cues, head turns, or add eye shifts to track Depth Perception: Overshoots Additional Comments: Pt occluding R eye vision but pt denies blurred vision or diplopia. pt with additional head movements during testing. pt noted to have frog hoping with visual pursuit   Perception Perception Perception Tested?: Yes Perception Deficits: Inattention/neglect Inattention/Neglect: Does not attend to left side of body   Praxis      Pertinent Vitals/Pain Pain Assessment: 0-10 Pain Score: 5  Pain Location: R eye pain (   closing) Pain Descriptors / Indicators: Dull Pain Intervention(s): Premedicated  before session;Repositioned     Hand Dominance Right   Extremity/Trunk Assessment Upper Extremity Assessment Upper Extremity Assessment: LUE deficits/detail LUE Deficits / Details: decr attention, 4 out 5 MMT, drift L UE LUE Sensation: decreased proprioception   Lower Extremity Assessment Lower Extremity Assessment: Defer to PT  evaluation   Cervical / Trunk Assessment Cervical / Trunk Assessment: Kyphotic   Communication Communication Communication: No difficulties;Other (comment)   Cognition Arousal/Alertness: Awake/alert Behavior During Therapy: Impulsive Overall Cognitive Status: Impaired/Different from baseline Area of Impairment: Orientation;Attention;Memory;Safety/judgement;Following commands;Awareness;Problem solving Orientation Level: Disoriented to;Time (reported tueday and month as may) Current Attention Level: Sustained Memory: Decreased short-term memory;Decreased recall of precautions Following Commands: Follows one step commands with increased time;Follows one step commands inconsistently Safety/Judgement: Decreased awareness of safety;Decreased awareness of deficits Awareness: Emergent Problem Solving: Slow processing General Comments: pt very distracted, pt decr attention to L side, pt with poor awareness to L side pocketing   General Comments       Exercises       Shoulder Instructions      Home Living Family/patient expects to be discharged to:: Private residence Living Arrangements: Spouse/significant other Available Help at Discharge: Family Type of Home: Other(Comment) Home Access: Elevator     Home Layout: One level     Bathroom Shower/Tub: Occupational psychologist: Standard     Home Equipment: None      Lives With: Significant other    Prior Functioning/Environment Level of Independence: Independent        Comments: Independent, drove, ran errands, etc.    OT Diagnosis: Generalized weakness;Cognitive deficits;Disturbance of vision   OT Problem List: Decreased strength;Decreased activity tolerance;Impaired balance (sitting and/or standing);Impaired vision/perception;Decreased coordination;Decreased cognition;Decreased safety awareness;Decreased knowledge of use of DME or AE;Decreased knowledge of precautions;Impaired UE functional use   OT  Treatment/Interventions: Self-care/ADL training;Therapeutic exercise;Neuromuscular education;DME and/or AE instruction;Therapeutic activities;Cognitive remediation/compensation;Visual/perceptual remediation/compensation;Patient/family education;Balance training    OT Goals(Current goals can be found in the care plan section) Acute Rehab OT Goals Patient Stated Goal: to be able to go back home and cut "friends hand - pt and girlfriend cut each other hair OT Goal Formulation: With patient Time For Goal Achievement: 03/08/15 Potential to Achieve Goals: Good  OT Frequency: Min 3X/week   Barriers to D/C:            Co-evaluation              End of Session Equipment Utilized During Treatment: Gait belt;Rolling walker Nurse Communication: Mobility status;Precautions  Activity Tolerance: Patient tolerated treatment well Patient left: in chair;with call bell/phone within reach;with family/visitor present;with chair alarm set   Time: 7829-5621 OT Time Calculation (min): 37 min Charges:  OT General Charges $OT Visit: 1 Procedure OT Evaluation $Initial OT Evaluation Tier I: 1 Procedure OT Treatments $Self Care/Home Management : 8-22 mins G-Codes:    Peri Maris 17-Mar-2015, 10:37 AM  Pager: [phone]

## 2015-02-22 NOTE — Progress Notes (Signed)
Patient awoken by RN, patient easily arousable, complains of mild nausea. Zofran given. Patient's heart rate 40, SB. Neuro assessment unchanged, will continue to monitor. MD aware.

## 2015-02-22 NOTE — Consult Note (Signed)
ELECTROPHYSIOLOGY CONSULT NOTE    Patient ID: Parker [name] MRN: [id number], DOB/AGE: 79/23/1928 79 y.o.  Admit date: 02/19/2015 Date of Consult: 02/22/2015  Primary Physician: No primary care provider on file. Primary Cardiologist: Ubaldo Glassing  Reason for Consultation: bradycardia  HPI:  Parker [name] is a 81 y.o. male with a past medical history significant for CAD, chronic systolic dysfunction, bradycardia, hypertension.  He presented on 02/19/15 with a non-dominant right large MCA infarct.  Telemetry has demonstrated atrial fibrillation with bradycardia and complete heart block. Prior to admission, he reports that his energy level was good and he had no real functional limitations. However, when speaking with the family, he has been sedentary because he gets sob with exertion.  EP has been asked to evaluate for placement of permanent pacemaker,  He currently feels like he is recovering well from his stroke. He denies chest pain, shortness of breath, LE edema, recent fevers, chills, nausea or vomiting.   Echo this admission demonstrated EF 25-30%, diffuse hypokinesis, grade 2 diastolic dysfunction, PA pressure 60, LA 44.   Past Medical History  Diagnosis Date  . MI (myocardial infarction)   . Hypertension   . Hyperlipidemia   . Bradycardia     per girlfriend, has refused pacemaker   . Chronic systolic heart failure     35%-45%,  2013  . Head and neck cancer     surgical resection, no XRT  . Atrial fibrillation      Surgical History:  Past Surgical History  Procedure Laterality Date  . Back surgery    . Radiology with anesthesia N/A 02/19/2015    Procedure: RADIOLOGY WITH ANESTHESIA;  Surgeon: Luanne Bras, MD;  Location: [facility];  Service: Radiology;  Laterality: N/A;     Prescriptions prior to admission  Medication Sig Dispense Refill Last Dose  . aspirin EC 81 MG tablet Take 81 mg by mouth daily.   02/19/2015 at Unknown time  . lisinopril (PRINIVIL,ZESTRIL) 20 MG tablet  Take 20 mg by mouth at bedtime.    02/18/2015 at Unknown time  . omeprazole (PRILOSEC) 20 MG capsule Take 20 mg by mouth daily.   02/19/2015 at Unknown time  . simvastatin (ZOCOR) 20 MG tablet Take 20 mg by mouth at bedtime.   02/18/2015 at Unknown time    Inpatient Medications:  .  stroke: mapping our early stages of recovery book   Does not apply Once  . antiseptic oral rinse  7 mL Mouth Rinse QID  . aspirin EC  325 mg Oral Daily  . atorvastatin  40 mg Oral q1800  . chlorhexidine  15 mL Mouth Rinse BID  . enoxaparin (LOVENOX) injection  40 mg Subcutaneous Q24H  . pantoprazole  40 mg Oral Daily    Allergies: No Known Allergies  History   Social History  . Marital Status: Widowed    Spouse Name: N/A  . Number of Children: N/A  . Years of Education: N/A   Occupational History  . Not on file.   Social History Main Topics  . Smoking status: Former Research scientist (life sciences)  . Smokeless tobacco: Not on file  . Alcohol Use: No  . Drug Use: No  . Sexual Activity: Not on file   Other Topics Concern  . Not on file   Social History Narrative     Family History  Problem Relation Age of Onset  . Heart attack Mother 46  . Cancer Father   . Cancer Paternal Grandfather  Review of Systems: All other systems reviewed and are otherwise negative except as noted above.  Physical Exam: Filed Vitals:   02/21/15 2132 02/22/15 0152 02/22/15 0606 02/22/15 0938  BP: 160/74 156/71 169/87 164/79  Pulse: 63 50 53 85  Temp: 98.3 F (36.8 C) 98.2 F (36.8 C) 98.6 F (37 C) 97.7 F (36.5 C)  TempSrc: Oral Axillary Oral Oral  Resp: 18 18 19 18   Height:      Weight:      SpO2: 97% 100% 99% 99%    GEN- The patient is elderly appearing, alert and oriented x 3 today.   HEENT: normocephalic, atraumatic; sclera clear, conjunctiva pink; hearing intact; oropharynx clear; neck supple, no JVP Lymph- no cervical lymphadenopathy Lungs- Clear to ausculation bilaterally, normal work of breathing.  No wheezes,  rales, rhonchi Heart- Bradycardic regular rate and rhythm  GI- soft, non-tender, non-distended, bowel sounds present  Extremities- no clubbing, cyanosis, or edema; DP/PT/radial pulses 2+ bilaterally MS- no significant deformity or atrophy Skin- warm and dry, no rash or lesion Psych- euthymic mood, full affect, easily distracted Neuro- strength and sensation are intact  Labs:   Lab Results  Component Value Date   WBC 7.7 02/22/2015   HGB 14.9 02/22/2015   HCT 44.9 02/22/2015   MCV 93.3 02/22/2015   PLT 147* 02/22/2015    Recent Labs Lab 02/19/15 1830  02/22/15 0400  NA 140  < > 138  K 4.0  < > 3.7  CL 104  < > 103  CO2 26  < > 25  BUN 13  < > 12  CREATININE 1.12  < > 0.96  CALCIUM 9.3  < > 8.8*  PROT 6.9  --   --   BILITOT 1.1  --   --   ALKPHOS 97  --   --   ALT 14*  --   --   AST 23  --   --   GLUCOSE 105*  < > 114*  < > = values in this interval not displayed.    Radiology/Studies: Ct Head Wo Contrast 02/19/2015   CLINICAL DATA:  Concern for stroke. Left-sided paralysis. Post catheter directed thrombolysis.  EXAM: CT HEAD WITHOUT CONTRAST  TECHNIQUE: Contiguous axial images were obtained from the base of the skull through the vertex without intravenous contrast.  COMPARISON:  Head CT- 02/19/2015 ; catheter directed thrombolysis  FINDINGS: There is ill-defined blurring of the gray-white junction extending from the right temporal lobe (image 12, series 2) through the right posterior parietal lobe (image 16), compatible with involving infarct. No definite evidence of hemorrhagic conversion.  The gray-white differentiation is otherwise well maintained. Unchanged size and configuration of the ventricles and basilar cisterns. Incidental note is again made of a cavum septum pellucidum. No midline shift. No definite intraparenchymal or extra-axial mass. Intracranial atherosclerosis.  Limited visualization the paranasal sinuses and mastoid air cells is normal. No air-fluid levels.  Regional soft tissues appear normal. No displaced calvarial fracture.  IMPRESSION: Evolving right MCA distribution infarct without evidence of hemorrhagic conversion or mass effect/midline shift.   Electronically Signed   By: Sandi Mariscal M.D.   On: 02/19/2015 23:58   Mr Brain Wo Contrast 02/20/2015   CLINICAL DATA:  Left-sided paralysis. Recent endovascular procedure.  EXAM: MRI HEAD WITHOUT CONTRAST  MRA HEAD WITHOUT CONTRAST  TECHNIQUE: Multiplanar, multiecho pulse sequences of the brain and surrounding structures were obtained without intravenous contrast. Angiographic images of the head were obtained using MRA technique without contrast.  COMPARISON:  CT head 522.  Endovascular procedure 5/22  FINDINGS: MRI HEAD FINDINGS  There is an extensive area of acute infarction affecting most of the RIGHT middle cerebral artery territory including the frontal, temporal, and parietal lobes, the insular cortex and subcortical white matter, as well as the basal ganglia including the caudate and lentiform nucleus. There is some sparing of the subcortical and periventricular white matter on the RIGHT. A tiny incidental region of cortical and subcortical infarction affects the LEFT lobe.  Generalized atrophy. Chronic microvascular ischemic change. No petechial hemorrhage or foci of chronic hemorrhage. Flow void patency of the RIGHT ICA and MCA has been re-established. Cavum septum pellucidum and vergae. Early right-to-left midline shift estimated 3 mm. No extra-axial fluid collection. No tonsillar or uncal herniation. Extracranial soft tissues grossly unremarkable.  MRA HEAD FINDINGS  Both internal carotid arteries are patent without flow-limiting stenosis. Both middle cerebral arteries are patent without flow-limiting stenosis. Minor proximal oriented LEFT A1 ACA, non flow reducing. Minor irregularity of the proximal RIGHT M2 branches. Basilar artery widely patent with vertebrals codominant. Severe stenosis P2 RIGHT PCA. No  cerebellar branch occlusion. No intracranial aneurysm or vascular occlusion.  IMPRESSION: Extensive area of acute infarction affecting most of the RIGHT middle cerebral artery territory, including the basal ganglia. No hemorrhagic transformation.  Tiny incidental area of ischemia LEFT occipital lobe, of no clinical significance.  Early right-to-left shift of 3 mm.  Patency of the proximal vasculature is been re-established with endovascular treatment.   Electronically Signed   By: Rolla Flatten M.D.   On: 02/20/2015 18:01   Dg Chest Port 1 View 02/20/2015   CLINICAL DATA:  81 year old with cough.  EXAM: PORTABLE CHEST - 1 VIEW  COMPARISON:  None.  FINDINGS: Endotracheal tube is 4.4 cm above the carina. Nasogastric tube extends into the abdomen. Negative for a pneumothorax. Calcification in the right lower chest possibly related to a calcified granuloma. There may be additional calcifications in the right hilum. Findings may represent old granulomatous disease. Heart size is upper limits of normal. Few densities at the left lung base are suggestive for atelectasis but no significant airspace disease.  IMPRESSION: Endotracheal tube is appropriately positioned above the carina.  Suspect old granulomatous disease.   Electronically Signed   By: Markus Daft M.D.   On: 02/20/2015 09:24   TELEMETRY: atrial fibrillation with complete heart block, ventricular rates 40's  Assessment/Plan: 1.  Complete heart block The patient has complete heart block with no reversible causes identified. He has a longstanding history of bradycardia and has previously refused pacemaker implantation.  He has a class 1 indication for pacemaker implantation.  Risks, benefits reviewed with the patient and his family who wish to proceed. Will tentatively plan for this afternoon. Will attempt biv pacing if anatomy conducive. He has at least class 2 symptoms.  2.  Newly diagnosed atrial fibrillation The patient presented with an embolic stroke  and has newly diagnosed atrial fibrillation.  Will need lifelong anticoagulation with CHADS2VASC score of 7 Will defer timing of anticoagulation to neurology. Would prefer to wait until 48 hours post pacemaker placement to start.   3.  Chronic systolic dysfunction/ICM, class 2 chronicall Euvolemic on exam Continue medical therapy with ACE-I Will add BB once pacemaker in place  4.  CAD No recent ischemic symptoms Continue medical therapy  5.  CVA Per neurology  6.  HTN Stable No change required today   Signed, Chanetta Marshall, NP 02/22/2015 10:24 AM  EP Attending  Patient seen and examined. Agree with above history, exam, labs, assessment and plan with minor revisions. Will hope to place a BiV PM as he will be pacing 100% of the time and has at least class 2 chf.  Mikle Bosworth.D.

## 2015-02-22 NOTE — Progress Notes (Signed)
Speech Language Pathology Treatment: Cognitive-Linquistic (NPO for pacer this pm)  Patient Details Name: Parker [name] MRN: [id number] DOB: Jun 01, 1927 Today's Date: 02/22/2015 Time: 0973-5329 SLP Time Calculation (min) (ACUTE ONLY): 16 min  Assessment / Plan / Recommendation Clinical Impression  Pt demonstrating mild improvements in short-term recall.  With practice using mnemonics to increase word recall, increased accuracy from 25% to 100% with mod cues.  Retention improved to two minutes.  Recall of functional, meaningful instructions > novel items.  (Pt with poor prospective memory for procedure this afternoon, but recalled PT's instructions to "stay inside the walker.)  No swallowing f/u completed today as pt is NPO for pacer; but apparently with persisting pocketing left cheek and intermittent cough during bfast meal.  Recommend increasing supervision from intermittent to full during meals.  SLP will follow for cognition and swallow.    HPI Other Pertinent Information: 81 y.o. male admitted with CVA s/p tPA followed by complete revascularization of occluded RT MCA .  Intubated 5/22-23.     Pertinent Vitals    SLP Plan  Continue with current plan of care    Recommendations                Oral Care Recommendations: Oral care BID Follow up Recommendations: Inpatient Rehab Plan: Continue with current plan of care   Parker [name] L. Tivis Ringer, [place] CCC/SLP Pager [phone]      Parker [name] 02/22/2015, 1:06 PM

## 2015-02-22 NOTE — Consult Note (Signed)
Physical Medicine and Rehabilitation Consult Reason for Consult: Right MCA infarct as well as left occipital small infarct Referring Physician: Dr.Xu   HPI: Parker [name] is a 81 y.o. right handed male with history of CAD with stenting, hypertension, chronic systolic congestive heart failure, atrial fibrillation maintained on aspirin. Independent and  active prior to admission. He presented 02/19/2015 with left-sided weakness altered mental status and right gaze preference. 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 Patient did receive TPA. MRI and MRA showed extensive area of acute infarct right middle cerebral artery territory as well as left occipital small infarct. Echocardiogram with ejection fraction of 46% grade 2 diastolic dysfunction. Findings of occluded right MCA M1 segment underwent complete revascularization for interventional radiology. Patient did require ventilatory support for a short time. EP consult for question need of TEE possible loop recorder due to atrial fibrillation/bradycardia. Currently maintained on aspirin 325 mg daily for CVA prophylaxis as well as subcutaneous Lovenox for DVT prophylaxis. Tolerating a regular consistency diet. Physical therapy evaluation completed 02/21/2015 with recommendations of physical medicine rehabilitation consult   Pt has decided to undergo PPM placement today.  Review of Systems  Cardiovascular: Positive for palpitations.  Gastrointestinal: Positive for constipation.  Musculoskeletal: Positive for back pain.  Neurological: Positive for weakness.  All other systems reviewed and are negative.  Past Medical History  Diagnosis Date  . MI (myocardial infarction)   . Hypertension   . Hyperlipidemia   . Bradycardia     per girlfriend, has refused pacemaker   . Chronic systolic heart failure     35%-45%,  2013  . Head and neck cancer     surgical resection, no XRT  . Atrial fibrillation     Past Surgical History  Procedure Laterality Date  . Back surgery    . Radiology with anesthesia N/A 02/19/2015    Procedure: RADIOLOGY WITH ANESTHESIA;  Surgeon: Luanne Bras, MD;  Location: [facility];  Service: Radiology;  Laterality: N/A;   Family History  Problem Relation Age of Onset  . Heart attack Mother 75  . Cancer Father   . Cancer Paternal Grandfather    Social History:  reports that he has quit smoking. He does not have any smokeless tobacco history on file. He reports that he does not drink alcohol or use illicit drugs. Allergies: No Known Allergies Medications Prior to Admission  Medication Sig Dispense Refill  . aspirin EC 81 MG tablet Take 81 mg by mouth daily.    Marland Kitchen lisinopril (PRINIVIL,ZESTRIL) 20 MG tablet Take 20 mg by mouth at bedtime.     Marland Kitchen omeprazole (PRILOSEC) 20 MG capsule Take 20 mg by mouth daily.    . simvastatin (ZOCOR) 20 MG tablet Take 20 mg by mouth at bedtime.      Home: Home Living Family/patient expects to be discharged to:: Private residence Living Arrangements: Spouse/significant other Available Help at Discharge: Family Type of Home: Other(Comment) Home Access: Elevator Home Layout: One level Home Equipment: None  Lives With: Significant other  Functional History: Prior Function Level of Independence: Independent Comments: Independent, drove, ran errands, etc. Functional Status:  Mobility: Bed Mobility Overal bed mobility: Needs Assistance Bed Mobility: Supine to Sit Supine to sit: Min assist General bed mobility comments: Pt slow to intiate movement due to unable to understand the task "sit up at EOB right here"  TC to guide and minimal steady assist Transfers Overall transfer level: Needs assistance Transfers: Sit to/from  Stand Sit to Stand: Min assist General transfer comment: steady assist; vc and demo. for hand placement Ambulation/Gait Ambulation/Gait assistance: Min assist Ambulation Distance (Feet): 80 Feet Assistive  device: None General Gait Details: mildly unsteady gait through out.  pt grabbing for the wall, people for stability Gait Pattern/deviations: Step-through pattern, Decreased step length - right, Decreased step length - left, Trunk flexed, Wide base of support Gait velocity: slower    ADL: ADL Overall ADL's : Needs assistance/impaired Eating/Feeding: Minimal assistance, Cueing for safety, Sitting Eating/Feeding Details (indicate cue type and reason): pt reports "feels lumpy". Pt requesting additional options. Pt provided cereal. Pt eating cereal dry initally and OT cued to add milk. Pt taking large bites Grooming: Wash/dry hands, Minimal assistance, Sitting Lower Body Bathing: Moderate assistance, Sit to/from stand Toilet Transfer: Moderate assistance, Ambulation, RW, BSC Toilet Transfer Details (indicate cue type and reason): cues for safety. pt requesting toilet transfer with foley in place. Pt  not voiding bowels  Functional mobility during ADLs: Moderate assistance, Rolling walker  Cognition: Cognition Overall Cognitive Status: Impaired/Different from baseline Arousal/Alertness: Awake/alert Orientation Level: Oriented X4 Attention: Sustained Sustained Attention: Impaired Sustained Attention Impairment: Verbal basic, Functional basic Memory: Impaired Memory Impairment: Storage deficit, Retrieval deficit, Decreased short term memory Decreased Short Term Memory: Verbal basic Awareness: Impaired Awareness Impairment: Intellectual impairment Problem Solving: Impaired Problem Solving Impairment: Verbal basic Behaviors: Impulsive, Perseveration Safety/Judgment: Impaired Cognition Arousal/Alertness: Awake/alert Behavior During Therapy: Impulsive Overall Cognitive Status: Impaired/Different from baseline Area of Impairment: Orientation, Attention, Memory, Safety/judgement, Following commands, Awareness, Problem solving Orientation Level: Disoriented to, Time (reported tueday and  month as may) Current Attention Level: Sustained Memory: Decreased short-term memory, Decreased recall of precautions Following Commands: Follows one step commands with increased time, Follows one step commands inconsistently Safety/Judgement: Decreased awareness of safety, Decreased awareness of deficits Awareness: Emergent Problem Solving: Slow processing General Comments: pt very distracted, pt decr attention to L side, pt with poor awareness to L side pocketing  Blood pressure 164/79, pulse 85, temperature 97.7 F (36.5 C), temperature source Oral, resp. rate 18, height 6\' 3"  (1.905 m), weight 89.6 kg (197 lb 8.5 oz), SpO2 99 %. Physical Exam  Constitutional: He appears well-developed.  HENT:  Mild left facial droop  Eyes:  Pupils reactive to light  Neck: Normal range of motion. Neck supple. No thyromegaly present.  Cardiovascular:  Cardiac rate controlled  Respiratory: Effort normal and breath sounds normal. No respiratory distress.  GI: Soft. Bowel sounds are normal. He exhibits no distension.  Neurological: He is alert.  Provides name and age. Follows simple commands. Patient has a right gaze preference. Fair awareness of deficits  Skin: Skin is warm and dry.  Motor 5/5 R delt Bi tri grip HF KE and ankle DF 4/5 Left Delt bi tri grip HF KE and ADF Sensory reduced to LT LUE>LLE, intact on Right side Visual field cut Left temporal field ? Left CN VII with incomplete lid closure and tearing Results for orders placed or performed during the hospital encounter of 02/19/15 (from the past 24 hour(s))  Glucose, capillary     Status: Abnormal   Collection Time: 02/21/15 11:52 AM  Result Value Ref Range   Glucose-Capillary 133 (H) 65 - 99 mg/dL   Comment 1 Document in Chart   CBC     Status: Abnormal   Collection Time: 02/22/15  4:00 AM  Result Value Ref Range   WBC 7.7 4.0 - 10.5 K/uL   RBC 4.81 4.22 - 5.81 MIL/uL  Hemoglobin 14.9 13.0 - 17.0 g/dL   HCT 44.9 39.0 - 52.0 %    MCV 93.3 78.0 - 100.0 fL   MCH 31.0 26.0 - 34.0 pg   MCHC 33.2 30.0 - 36.0 g/dL   RDW 14.5 11.5 - 15.5 %   Platelets 147 (L) 150 - 400 K/uL  Basic metabolic panel     Status: Abnormal   Collection Time: 02/22/15  4:00 AM  Result Value Ref Range   Sodium 138 135 - 145 mmol/L   Potassium 3.7 3.5 - 5.1 mmol/L   Chloride 103 101 - 111 mmol/L   CO2 25 22 - 32 mmol/L   Glucose, Bld 114 (H) 65 - 99 mg/dL   BUN 12 6 - 20 mg/dL   Creatinine, Ser 0.96 0.61 - 1.24 mg/dL   Calcium 8.8 (L) 8.9 - 10.3 mg/dL   GFR calc non Af Amer >60 >60 mL/min   GFR calc Af Amer >60 >60 mL/min   Anion gap 10 5 - 15   Mr Brain Wo Contrast  02/20/2015   CLINICAL DATA:  Left-sided paralysis. Recent endovascular procedure.  EXAM: MRI HEAD WITHOUT CONTRAST  MRA HEAD WITHOUT CONTRAST  TECHNIQUE: Multiplanar, multiecho pulse sequences of the brain and surrounding structures were obtained without intravenous contrast. Angiographic images of the head were obtained using MRA technique without contrast.  COMPARISON:  CT head 522.  Endovascular procedure 5/22  FINDINGS: MRI HEAD FINDINGS  There is an extensive area of acute infarction affecting most of the RIGHT middle cerebral artery territory including the frontal, temporal, and parietal lobes, the insular cortex and subcortical white matter, as well as the basal ganglia including the caudate and lentiform nucleus. There is some sparing of the subcortical and periventricular white matter on the RIGHT. A tiny incidental region of cortical and subcortical infarction affects the LEFT lobe.  Generalized atrophy. Chronic microvascular ischemic change. No petechial hemorrhage or foci of chronic hemorrhage. Flow void patency of the RIGHT ICA and MCA has been re-established. Cavum septum pellucidum and vergae. Early right-to-left midline shift estimated 3 mm. No extra-axial fluid collection. No tonsillar or uncal herniation. Extracranial soft tissues grossly unremarkable.  MRA HEAD FINDINGS   Both internal carotid arteries are patent without flow-limiting stenosis. Both middle cerebral arteries are patent without flow-limiting stenosis. Minor proximal oriented LEFT A1 ACA, non flow reducing. Minor irregularity of the proximal RIGHT M2 branches. Basilar artery widely patent with vertebrals codominant. Severe stenosis P2 RIGHT PCA. No cerebellar branch occlusion. No intracranial aneurysm or vascular occlusion.  IMPRESSION: Extensive area of acute infarction affecting most of the RIGHT middle cerebral artery territory, including the basal ganglia. No hemorrhagic transformation.  Tiny incidental area of ischemia LEFT occipital lobe, of no clinical significance.  Early right-to-left shift of 3 mm.  Patency of the proximal vasculature is been re-established with endovascular treatment.   Electronically Signed   By: Rolla Flatten M.D.   On: 02/20/2015 18:01   Mr Jodene Nam Head/brain Wo Cm  02/20/2015   CLINICAL DATA:  Left-sided paralysis. Recent endovascular procedure.  EXAM: MRI HEAD WITHOUT CONTRAST  MRA HEAD WITHOUT CONTRAST  TECHNIQUE: Multiplanar, multiecho pulse sequences of the brain and surrounding structures were obtained without intravenous contrast. Angiographic images of the head were obtained using MRA technique without contrast.  COMPARISON:  CT head 522.  Endovascular procedure 5/22  FINDINGS: MRI HEAD FINDINGS  There is an extensive area of acute infarction affecting most of the RIGHT middle cerebral artery territory including the frontal,  temporal, and parietal lobes, the insular cortex and subcortical white matter, as well as the basal ganglia including the caudate and lentiform nucleus. There is some sparing of the subcortical and periventricular white matter on the RIGHT. A tiny incidental region of cortical and subcortical infarction affects the LEFT lobe.  Generalized atrophy. Chronic microvascular ischemic change. No petechial hemorrhage or foci of chronic hemorrhage. Flow void patency of  the RIGHT ICA and MCA has been re-established. Cavum septum pellucidum and vergae. Early right-to-left midline shift estimated 3 mm. No extra-axial fluid collection. No tonsillar or uncal herniation. Extracranial soft tissues grossly unremarkable.  MRA HEAD FINDINGS  Both internal carotid arteries are patent without flow-limiting stenosis. Both middle cerebral arteries are patent without flow-limiting stenosis. Minor proximal oriented LEFT A1 ACA, non flow reducing. Minor irregularity of the proximal RIGHT M2 branches. Basilar artery widely patent with vertebrals codominant. Severe stenosis P2 RIGHT PCA. No cerebellar branch occlusion. No intracranial aneurysm or vascular occlusion.  IMPRESSION: Extensive area of acute infarction affecting most of the RIGHT middle cerebral artery territory, including the basal ganglia. No hemorrhagic transformation.  Tiny incidental area of ischemia LEFT occipital lobe, of no clinical significance.  Early right-to-left shift of 3 mm.  Patency of the proximal vasculature is been re-established with endovascular treatment.   Electronically Signed   By: Rolla Flatten M.D.   On: 02/20/2015 18:01    Assessment/Plan: Diagnosis: Right MCA infarct with Left HP, Left neglect and Left HH 1. Does the need for close, 24 hr/day medical supervision in concert with the patient's rehab needs make it unreasonable for this patient to be served in a less intensive setting? Yes 2. Co-Morbidities requiring supervision/potential complications: Cardiac arrythmia with PPM [placement 5/25 pnd), Chronic systolic CHF, HTN uncontrolled 3. Due to bladder management, bowel management, safety, skin/wound care, disease management, medication administration, pain management and patient education, does the patient require 24 hr/day rehab nursing? Yes 4. Does the patient require coordinated care of a physician, rehab nurse, PT (1-2 hrs/day, 5 days/week), OT (1-2 hrs/day, 5 days/week) and SLP (.5-1 hrs/day, 5  days/week) to address physical and functional deficits in the context of the above medical diagnosis(es)? Yes Addressing deficits in the following areas: balance, endurance, locomotion, strength, transferring, bowel/bladder control, bathing, dressing, feeding, grooming, toileting, cognition, speech and language 5. Can the patient actively participate in an intensive therapy program of at least 3 hrs of therapy per day at least 5 days per week? Yes 6. The potential for patient to make measurable gains while on inpatient rehab is good 7. Anticipated functional outcomes upon discharge from inpatient rehab are supervision  with PT, supervision with OT, supervision with SLP. 8. Estimated rehab length of stay to reach the above functional goals is: 10-14d 9. Does the patient have adequate social supports and living environment to accommodate these discharge functional goals? Potentially 10. Anticipated D/C setting: Home 11. Anticipated post D/C treatments: [facility] therapy 12. Overall Rehab/Functional Prognosis: good  RECOMMENDATIONS: This patient's condition is appropriate for continued rehabilitative care in the following setting: CIR Patient has agreed to participate in recommended program. Yes Note that insurance prior authorization may be required for reimbursement for recommended care.  Comment: needs  PPM placed first with adequate post op recovery    02/22/2015

## 2015-02-22 NOTE — Progress Notes (Signed)
Chart reviewed. Patient in cath lab.  Will see 5/26.  Doree Barthel, MD [facility]

## 2015-02-22 NOTE — Progress Notes (Signed)
Rehab admissions - Evaluated for possible admission.  I met with patient's girl friend (patient was asleep in the room).  Daughters and GF would like inpatient rehab admission.  I will open the case with [employer] and request acute inpatient rehab admission.  I will follow up tomorrow.  Call me for questions.  #[phone]

## 2015-02-23 ENCOUNTER — Encounter (HOSPITAL_COMMUNITY): Payer: Self-pay | Admitting: Internal Medicine

## 2015-02-23 ENCOUNTER — Encounter (HOSPITAL_COMMUNITY)
Admission: EM | Disposition: A | Discharge status: Rehabilitation Facility | Payer: Self-pay | Source: Home / Self Care | Attending: Neurology

## 2015-02-23 ENCOUNTER — Inpatient Hospital Stay (HOSPITAL_COMMUNITY): Payer: Medicare PPO

## 2015-02-23 DIAGNOSIS — E785 Hyperlipidemia, unspecified: Secondary | ICD-10-CM | POA: Diagnosis present

## 2015-02-23 DIAGNOSIS — I639 Cerebral infarction, unspecified: Secondary | ICD-10-CM | POA: Insufficient documentation

## 2015-02-23 DIAGNOSIS — Z950 Presence of cardiac pacemaker: Secondary | ICD-10-CM | POA: Insufficient documentation

## 2015-02-23 DIAGNOSIS — R338 Other retention of urine: Secondary | ICD-10-CM | POA: Diagnosis not present

## 2015-02-23 DIAGNOSIS — D72829 Elevated white blood cell count, unspecified: Secondary | ICD-10-CM | POA: Diagnosis present

## 2015-02-23 DIAGNOSIS — I10 Essential (primary) hypertension: Secondary | ICD-10-CM | POA: Diagnosis present

## 2015-02-23 DIAGNOSIS — I4891 Unspecified atrial fibrillation: Secondary | ICD-10-CM | POA: Diagnosis present

## 2015-02-23 DIAGNOSIS — R339 Retention of urine, unspecified: Secondary | ICD-10-CM | POA: Diagnosis not present

## 2015-02-23 LAB — CBC
HCT: 45.4 % (ref 39.0–52.0)
Hemoglobin: 15.3 g/dL (ref 13.0–17.0)
MCH: 31.1 pg (ref 26.0–34.0)
MCHC: 33.7 g/dL (ref 30.0–36.0)
MCV: 92.3 fL (ref 78.0–100.0)
Platelets: 137 10*3/uL — ABNORMAL LOW (ref 150–400)
RBC: 4.92 MIL/uL (ref 4.22–5.81)
RDW: 14.2 % (ref 11.5–15.5)
WBC: 7.4 10*3/uL (ref 4.0–10.5)

## 2015-02-23 LAB — BASIC METABOLIC PANEL
Anion gap: 10 (ref 5–15)
BUN: 9 mg/dL (ref 6–20)
CHLORIDE: 103 mmol/L (ref 101–111)
CO2: 26 mmol/L (ref 22–32)
Calcium: 8.8 mg/dL — ABNORMAL LOW (ref 8.9–10.3)
Creatinine, Ser: 0.84 mg/dL (ref 0.61–1.24)
GFR calc Af Amer: >60 mL/min (ref >60)
GFR calc non Af Amer: >60 mL/min (ref >60)
Glucose, Bld: 103 mg/dL — ABNORMAL HIGH (ref 65–99)
POTASSIUM: 3.5 mmol/L (ref 3.5–5.1)
Sodium: 139 mmol/L (ref 135–145)

## 2015-02-23 SURGERY — LOOP RECORDER INSERTION

## 2015-02-23 SURGERY — ECHOCARDIOGRAM, TRANSESOPHAGEAL
Anesthesia: Moderate Sedation

## 2015-02-23 MED ORDER — CARVEDILOL 3.125 MG PO TABS
3.1250 mg | ORAL_TABLET | Freq: Two times a day (BID) | ORAL | Status: DC
  Administered 2015-02-23 – 2015-02-24 (×3): 3.125 mg via ORAL
  Filled 2015-02-23 (×3): qty 1

## 2015-02-23 MED ORDER — TAMSULOSIN HCL 0.4 MG PO CAPS
0.4000 mg | ORAL_CAPSULE | Freq: Every day | ORAL | Status: DC
  Administered 2015-02-23 – 2015-02-24 (×2): 0.4 mg via ORAL
  Filled 2015-02-23 (×2): qty 1

## 2015-02-23 MED FILL — Heparin Sodium (Porcine) 2 Unit/ML in Sodium Chloride 0.9%: INTRAMUSCULAR | Qty: 500 | Status: AC

## 2015-02-23 NOTE — Discharge Summary (Signed)
Stroke Discharge Summary  Patient ID: Parker [name]   MRN: [id number]      DOB: 1927/04/28  Date of Admission: 02/19/2015 Date of Discharge: 02/24/2015  Attending Physician:  No att. providers found, Stroke MD  Consulting Physician(s):    Randa Lynn, MD (pulmonary/intensive care), Alysia Penna, MD (Physical Medicine & Rehabtilitation), and Irish Lack, MD (electrophysiology)  Patient's PCP:  No primary care provider on file.  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  Past Medical History  Diagnosis Date  . MI (myocardial infarction)   . Hypertension   . Hyperlipidemia   . Bradycardia     per girlfriend, has refused pacemaker   . Chronic systolic heart failure     35%-45%,  2013  . Head and neck cancer     surgical resection, no XRT  . Atrial fibrillation    Past Surgical History  Procedure Laterality Date  . Back surgery    . Radiology with anesthesia N/A 02/19/2015    Procedure: RADIOLOGY WITH ANESTHESIA;  Surgeon: Luanne Bras, MD;  Location: [facility];  Service: Radiology;  Laterality: N/A;  . Ep implantable device N/A 02/22/2015    Procedure: BiV Pacemaker Insertion CRT-P;  Surgeon: Evans Lance, MD;  Location: [facility];  Service: Cardiovascular;  Laterality: N/A;    Medications to be continued on Rehab Lipitor 40 mg daily Aspirin 325 mg daily Start anticoagulation on Monday  Coreg 3.125 mg twice daily Lisinopril 20 mg daily at  bedtime Protonix 40 mg daily Flomax 0.4 mg daily   LABORATORY STUDIES CBC    Component Value Date/Time   WBC 6.6 02/24/2015 0320   RBC 4.87 02/24/2015 0320   HGB 15.2 02/24/2015 0320   HCT 44.7 02/24/2015 0320   PLT 125* 02/24/2015 0320   MCV 91.8 02/24/2015 0320   MCH 31.2 02/24/2015 0320   MCHC 34.0 02/24/2015 0320   RDW 13.9 02/24/2015 0320   LYMPHSABS 0.7 02/20/2015 0512   MONOABS 0.6 02/20/2015 0512   EOSABS 0.0 02/20/2015 0512   BASOSABS 0.0 02/20/2015 0512   CMP    Component Value Date/Time   NA 137 02/24/2015 0320   K 3.7 02/24/2015 0320   CL 101 02/24/2015 0320   CO2 25 02/24/2015 0320   GLUCOSE 105* 02/24/2015 0320   BUN 13 02/24/2015 0320   CREATININE 0.98 02/24/2015 0320   CALCIUM 8.7* 02/24/2015 0320   PROT 6.9 02/19/2015 1830   ALBUMIN 3.8 02/19/2015 1830   AST 23 02/19/2015 1830   ALT 14* 02/19/2015 1830   ALKPHOS 97 02/19/2015 1830   BILITOT 1.1 02/19/2015 1830   GFRNONAA >60 02/24/2015 0320   GFRAA >60 02/24/2015 0320   COAGS Lab Results  Component Value Date   INR 1.21 02/19/2015   Lipid Panel    Component Value Date/Time   CHOL 114 02/20/2015 0511   TRIG 71 02/20/2015 0511   HDL 44 02/20/2015 0511  CHOLHDL 2.6 02/20/2015 0511   VLDL 14 02/20/2015 0511   LDLCALC 56 02/20/2015 0511   HgbA1C  Lab Results  Component Value Date   HGBA1C 6.1* 02/20/2015   Cardiac Panel (last 3 results)  No results for input(s): CKTOTAL, CKMB, TROPONINI, RELINDX in the last 72 hours. Urinalysis No results found for: COLORURINE, APPEARANCEUR, LABSPEC, PHURINE, GLUCOSEU, HGBUR, BILIRUBINUR, [place], PROTEINUR, UROBILINOGEN, NITRITE, LEUKOCYTESUR Urine Drug Screen No results found for: LABOPIA, COCAINSCRNUR, LABBENZ, AMPHETMU, THCU, LABBARB  Alcohol Level    Component Value Date/Time   ETH <5 02/19/2015 1830     SIGNIFICANT DIAGNOSTIC STUDIES Ct Head Wo Contrast 02/19/2015 IMPRESSION: Evolving right MCA distribution infarct without evidence of  hemorrhagic conversion or mass effect/midline shift.  02/19/2015 IMPRESSION: Findings concerning for early RIGHT middle cerebral artery territory infarct which could be confirmed on MRI of the brain with diffusion-weighted sequences as clinically indicated. There is a component of probable hemoconcentration. Otherwise normal noncontrast CT of the head for age.   Cerebral Angiogram 02/19/2015 S/P Bilateral common carotid arteriogram followed by complete revascularization of occluded RT MCA M1 segment using x1 pass with the the Solitaire FR 60mmx 41mm stent retrieval device and 6 mg of superselective IA TPA. TICI 3 restoration  MRI and MRA  Extensive area of acute infarction affecting most of the RIGHT middle cerebral artery territory, including the basal ganglia. No hemorrhagic transformation. Tiny incidental area of ischemia LEFT occipital lobe, of no clinical significance. Early right-to-left shift of 3 mm. Patency of the proximal vasculature is been re-established with endovascular treatment.  2D Echocardiogram - Left ventricle: The cavity size was moderately dilated. There was mild focal basal hypertrophy of the septum. Systolic function wasseverely reduced. The estimated ejection fraction was in therange of 25% to 30%. Diffuse hypokinesis. Features are consistent with a pseudonormal left ventricular filling pattern, withconcomitant abnormal relaxation and increased filling pressure(grade 2 diastolic dysfunction). Doppler parameters areconsistent with elevated ventricular end-diastolic fillingpressure. - Aortic valve: Moderately thickened, moderately calcified leaflets. Transvalvular velocity was within the normal range. There was no stenosis. There was trivial regurgitation. - Aortic root: The aortic root was normal in size. - Ascending aorta: The ascending aorta was normal in size. - Mitral valve: Calcified annulus. Mildly thickened leaflets . There was moderate  regurgitation. - Left atrium: The atrium was mildly dilated. - Right ventricle: The cavity size was normal. Wall thickness was normal. Systolic function was normal. - Right atrium: The atrium was moderately dilated. - Tricuspid valve: There was moderate regurgitation. - Pulmonary arteries: Systolic pressure was severely increased. PA peak pressure: 60 mm Hg (S).  CXR  02/23/2015 No acute abnormality noted. 02/20/15 Endotracheal tube is appropriately positioned above the carina. Suspect old granulomatous disease.  EKG sinus bradycardia. For complete results please see formal report.     HISTORY OF PRESENT ILLNES Parker [name] is an 81 y.o. male with a past medical history significant for CAD s/p stenting, brought in as a code stroke due to acute onset of left hemiparesis, decreased responsiveness, and a right gaze preference. Patient just finished eating dinner, was talking to a friend sitting in a chair when suddenly became less responsive, drooling, leaning to the left, no able to speak fluently. NIHSS 19. CT brain was personally reviewed and showed mild blurring of the RIGHT posterior insula gray-white matter differentiation concerning for an early right MCA infarct. He was given 10 mg IV labetalol before tpa due to elevated BP, but became bradycardic with BP 136. Patient's family stated that  by his wishes he won't like to be intubated. Family was initially reluctant about pursuing aggressive endovascular intervention but subsequently decided to pursue endovascular treatment. Interventional neuroradiologist was contacted. He was last seen normal: 02/19/15 at 530 pm. Tpa was given 02/19/2015 at [facility]. He was taken to intervention for bilateral common carotid arteriogram followed by complete revascularization of occluded RT MCA M1 using x1 pass with the the Solitaire stent retrieval device and 6 mg of superselective IA TPA. TICI 3 restoration. He was admitted to the neuro ICU for further evaluation and  treatment.   HOSPITAL COURSE Parker [name] is a 81 y.o. male with history of CAD s/p stenting admitted for aphasia and left sided weakness. Received IV tPA, IA tPA and mechanical thrombectomy. Symptoms rapid improving.   Stroke: Non-dominant right large MCA territory infarct as well as left occipital small infarct, embolic secondary to atrial fibrillation. S/p IV & IA tPA and intervention.   MRI Right large MCA infarct as well as left occipital small infarct  MRA Right M2 stenosis  Cerebral angio showed right M1 cut off. After thrombectomy, TICI 3 recannulization.  2D Echo EF 25-30%  LE venous doppler canceled given atrial fibrillation diagnosis  LDL 56  HgbA1c 6.1  aspirin 81 mg orally every day prior to admission, now on ASA 325mg . Will need to start anticoagulation on 02/27/15 (eliquis 5mg  bid) due to large stroke and high risk of hemorrhagic transformation   Ongoing aggressive stroke risk factor management  Therapy recommendations: CIR.   Disposition: CIR  Atrial fibrillation with complete AV block   HR at 30s  Asymptomatic  Pt was offered pacer as outpt in the past but he refused.  Will need to start anticoagulation (eliquis 5mg  bid) on 02/27/15 due to large stroke and high risk of hemorrhagic transformation which also give time for pacemaker placement.  Cardiomyopathy with complete AV block  EF 25-30%  Pt followed cardiologist in [place]  Was offered pacer in the past that he refused. He is now agreeable to placement.  EP cardiology consult   Pacer placed 02/22/2015  Cardiology ok for anticoagulation 2 days post pacer placement  Hypertension  Home meds: Lisinopril  BP goal - gradually normotensive  Stable  Home meds resumed.  Hyperlipidemia  Home meds: zocor 20   On lipitor 40mg  now  LDL 56, goal < 70  Continue statin at discharge  CAD s/p stenting  Trend troponin - down trending, not elevated  On ASA 325 for  now  Other Stroke Risk Factors  Advanced age  Coronary artery disease  Other Active Problems  Mild leukocytosis  Urinary retention. Foley placed 5/24. Will do trial voids in a day or so.   DISCHARGE EXAM Blood pressure 169/84, pulse 70, temperature 98.2 F (36.8 C), temperature source Oral, resp. rate 18, height 6\' 3"  (1.905 m), weight 89.7 kg (197 lb 12 oz), SpO2 95 %.   General - Well nourished, well developed, in no apparent distress.  Ophthalmologic - Fundi not visualized due to noncooperation.  Cardiovascular - bradycardia, regular rhythm.  Mental Status -  Awake alert orientated to place and people but not to time or age. Language including expression, naming, repetition, comprehension was assessed and found intact.  Cranial Nerves II - XII - II - Visual field intact OU. Left eyelid apraxia. III, IV, VI - Extraocular movements intact. V - Facial sensation intact bilaterally. VII - left facial droop. VIII - Hearing & vestibular intact bilaterally. X - Palate elevates symmetrically.  XI - Chin turning & shoulder shrug intact bilaterally. XII - Tongue protrusion intact.  Motor Strength - The patient's strength was 4/5 LUE and LLE with pronator drift on the left. Bulk was normal and fasciculations were absent.  Motor Tone - Muscle tone was assessed at the neck and appendages and was normal.  Reflexes - The patient's reflexes were 1+ in all extremities and he had no pathological reflexes.  Sensory - Light touch, temperature/pinprick were assessed and were symmetrical.   Coordination - The patient had normal movements in the hands with no ataxia or dysmetria. Tremor was absent.  Gait and Station - deferred due to safety concerns.  Discharge Diet  soft cardiac diet with thin liquids  DISCHARGE PLAN  Disposition:  Transfer to [facility] for ongoing PT, OT and ST  aspirin 325 mg orally every day for secondary stroke prevention. May start  anticoagulation (eliquis 5mg  bid) on Monday 02/27/15  Recommend ongoing risk factor control by Primary Care Physician at time of discharge from inpatient rehabilitation.  Follow-up No primary care provider on file. in 2 weeks following discharge from rehab.  Follow-up with Dr. Rosalin Hawking, Stroke Clinic in 2 months.   35 minutes were spent preparing discharge.  Parker Bussing PA-C [facility] Pager [phone] 02/24/2015, 6:29 PM  I, the attending vascular neurologist, have personally obtained a history, examined the patient, evaluated laboratory data, individually viewed imaging studies and agree with radiology interpretations.  Together with the NP/PA, we formulated the assessment and plan of care which reflects our mutual decision.  I have made any additions or clarifications directly to the above note and agree with the findings and plan as currently documented.   Rosalin Hawking, MD PhD Stroke Neurology 02/25/2015 6:29 AM

## 2015-02-23 NOTE — Progress Notes (Signed)
Speech Language Pathology Treatment: Dysphagia  Patient Details Name: Parker [name] MRN: [id number] DOB: 1926-11-07 Today's Date: 02/23/2015 Time: 5993-5701 SLP Time Calculation (min) (ACUTE ONLY): 14 min  Assessment / Plan / Recommendation Clinical Impression  Pt seen for f/u dysphagia treatment given reports of pocketing with meals on previous date. Today with Min cues, pt is able to adequate manipulate and clear solid boluses from oral cavity. Cough observed x1 following large straw sip of liquid. Would continue current diet.   HPI Other Pertinent Information: 81 y.o. male admitted with CVA s/p tPA followed by complete revascularization of occluded RT MCA .  Intubated 5/22-23.     Pertinent Vitals Pain Assessment: No/denies pain  SLP Plan  Continue with current plan of care    Recommendations Diet recommendations: Dysphagia 3 (mechanical soft);Thin liquid Liquids provided via: Cup;Straw Medication Administration: Whole meds with puree Supervision: Patient able to self feed;Intermittent supervision to cue for compensatory strategies Compensations: Check for pocketing;Small sips/bites Postural Changes and/or Swallow Maneuvers: Seated upright 90 degrees       Oral Care Recommendations: Oral care BID Follow up Recommendations: Inpatient Rehab Plan: Continue with current plan of care   Germain Osgood, M.A. CCC-SLP [phone]  Germain Osgood 02/23/2015, 4:50 PM

## 2015-02-23 NOTE — Progress Notes (Signed)
Physical Therapy Treatment Patient Details Name: Parker [name] MRN: [id number] DOB: 02-19-1927 Today's Date: 02/23/2015    History of Present Illness 81 yo male admitted with S/s of CVA including L side weakness, decr responsiveness, R gaze prefernce. Pt recieved TpA and IR clot retreival with complete revascularization. MRI (+) R MCA including basal ganglia; L occipital infarct PMH: MI    PT Comments    Participating quite well despite sleepy this am; Berg Score of 26/56 is indicative of an extremely high fall risk, further warranting intensive post-acute therapy at CIR.  Follow Up Recommendations  CIR     Equipment Recommendations  Rolling walker with 5" wheels;3in1 (PT)    Recommendations for Other Services       Precautions / Restrictions Precautions Precautions: Fall    Mobility  Bed Mobility Overal bed mobility: Needs Assistance Bed Mobility: Supine to Sit;Sit to Supine     Supine to sit: Mod assist Sit to supine: Min assist (max directional cues on how to scoot up in bed)   General bed mobility comments: pt reaching for PT to assist him, assist for trunk elevation  Transfers Overall transfer level: Needs assistance Equipment used: 1 person hand held assist Transfers: Sit to/from Stand Sit to Stand: Mod assist         General transfer comment: Light mod assist to power up; heavy reliance on UEs  Ambulation/Gait Ambulation/Gait assistance: Min assist Ambulation Distance (Feet): 20 Feet (to/from bathroom) Assistive device: 1 person hand held assist Gait Pattern/deviations: Shuffle;Trunk flexed     General Gait Details: very short, shuffling steps and tending to reach out fro UE support with amb in tight quarters to bathroom; tending to stagger   Stairs            Wheelchair Mobility    Modified Rankin (Stroke Patients Only) Modified Rankin (Stroke Patients Only) Pre-Morbid Rankin Score: No symptoms Modified Rankin: Moderately severe  disability     Balance                                    Cognition Arousal/Alertness: Awake/alert Behavior During Therapy: Impulsive Overall Cognitive Status: Impaired/Different from baseline Area of Impairment: Safety/judgement;Attention;Orientation;Memory;Following commands;Problem solving;Awareness   Current Attention Level: Sustained Memory: Decreased short-term memory;Decreased recall of precautions Following Commands: Follows one step commands with increased time (and repeat cues) Safety/Judgement: Decreased awareness of safety;Decreased awareness of deficits Awareness: Emergent Problem Solving: Slow processing      Exercises      General Comments General comments (skin integrity, edema, etc.): Bulk of session conducted on Room Air; O2 sats remained greater than or equal to 90%; 94% in bed at end of session on Room Air; RN aware      Pertinent Vitals/Pain Pain Assessment: Faces Faces Pain Scale: Hurts a little bit Pain Location: R shoulder (indicated upper trap); continued R eye closing Pain Descriptors / Indicators: Aching Pain Intervention(s): Monitored during session;Repositioned    Home Living                      Prior Function            PT Goals (current goals can now be found in the care plan section) Acute Rehab PT Goals Patient Stated Goal: pt and family are hopeful to go to CIR PT Goal Formulation: With patient Time For Goal Achievement: 03/07/15 Potential to Achieve Goals: Good Progress towards PT  goals: Progressing toward goals    Frequency  Min 3X/week    PT Plan Current plan remains appropriate    Co-evaluation             End of Session Equipment Utilized During Treatment: Gait belt Activity Tolerance: Patient tolerated treatment well Patient left: in bed;with call bell/phone within reach;with nursing/sitter in room     Time: 0855-0928 PT Time Calculation (min) (ACUTE ONLY): 33 min  Charges:  $Gait  Training: 8-22 mins $Therapeutic Activity: 8-22 mins                    G Codes:      Quin Hoop 02/23/2015, 9:43 AM  Roney Marion, [place] Pager [phone] Office [phone]

## 2015-02-23 NOTE — Discharge Instructions (Signed)
° ° °  Supplemental Discharge Instructions for  Pacemaker/Defibrillator Patients  Activity No heavy lifting or vigorous activity with your left/right arm for 6 to 8 weeks.  Do not raise your left/right arm above your head for one week.  Gradually raise your affected arm as drawn below.           __    02/26/15                     02/27/15                     02/28/15                  03/01/15   WOUND CARE - Keep the wound area clean and dry.  Do not get this area wet for one week. No showers for one week; you may shower on  03/01/15   . - The tape/steri-strips on your wound will fall off; do not pull them off.  No bandage is needed on the site.  DO  NOT apply any creams, oils, or ointments to the wound area. - If you notice any drainage or discharge from the wound, any swelling or bruising at the site, or you develop a fever > 101? F after you are discharged home, call the office at once.  Special Instructions - You are still able to use cellular telephones; use the ear opposite the side where you have your pacemaker/defibrillator.  Avoid carrying your cellular phone near your device. - When traveling through airports, show security personnel your identification card to avoid being screened in the metal detectors.  Ask the security personnel to use the hand wand. - Avoid arc welding equipment, MRI testing (magnetic resonance imaging), TENS units (transcutaneous nerve stimulators).  Call the office for questions about other devices. - Avoid electrical appliances that are in poor condition or are not properly grounded. - Microwave ovens are safe to be near or to operate.

## 2015-02-23 NOTE — Progress Notes (Signed)
Rehab admissions - I am awaiting call back from insurance carrier regarding acute inpatient rehab admission.  I will update all once I hear back from insurance case manager.  Call me for questions.  #[phone]

## 2015-02-23 NOTE — Progress Notes (Signed)
SUBJECTIVE: The patient is doing well today.  At this time, he denies chest pain, shortness of breath, or any new concerns.  CURRENT MEDICATIONS: .  stroke: mapping our early stages of recovery book   Does not apply Once  . antiseptic oral rinse  7 mL Mouth Rinse QID  . aspirin EC  325 mg Oral Daily  . atorvastatin  40 mg Oral q1800  .  ceFAZolin (ANCEF) IV  1 g Intravenous Q6H  . chlorhexidine  15 mL Mouth Rinse BID  . lisinopril  20 mg Oral QHS  . pantoprazole  40 mg Oral Daily      OBJECTIVE: Physical Exam: Filed Vitals:   02/22/15 1854 02/22/15 2000 02/23/15 0100 02/23/15 0500  BP: 189/94 199/105 149/76 160/76  Pulse: 70 69 70 70  Temp: 94.5 F (34.7 C) 97.9 F (36.6 C)  98 F (36.7 C)  TempSrc: Axillary Oral  Oral  Resp: 15 14 16 20   Height:      Weight:   193 lb 9 oz (87.8 kg)   SpO2: 90% 100% 98% 97%    Intake/Output Summary (Last 24 hours) at 02/23/15 2774 Last data filed at 02/23/15 0554  Gross per 24 hour  Intake      0 ml  Output   3475 ml  Net  -3475 ml    Telemetry reveals atrial fibrillation with ventricular pacing  GEN- The patient is elderly appearing, alert and oriented x 3 today.   Head- normocephalic, atraumatic Eyes-  Sclera clear, conjunctiva pink Ears- hearing intact Oropharynx- clear Neck- supple Lungs- Clear to ausculation bilaterally, normal work of breathing Heart- Regular rate and rhythm (paced) GI- soft, NT, ND, + BS Extremities- no clubbing, cyanosis, or edema Skin- no rash or lesion Psych- euthymic mood, full affect   LABS: Basic Metabolic Panel:  Recent Labs  02/22/15 0400 02/23/15 0309  NA 138 139  K 3.7 3.5  CL 103 103  CO2 25 26  GLUCOSE 114* 103*  BUN 12 9  CREATININE 0.96 0.84  CALCIUM 8.8* 8.8*   CBC:  Recent Labs  02/22/15 0400 02/23/15 0309  WBC 7.7 7.4  HGB 14.9 15.3  HCT 44.9 45.4  MCV 93.3 92.3  PLT 147* 137*   Cardiac Enzymes:  Recent Labs  02/20/15 0953 02/20/15 1524  02/20/15 2100  TROPONINI 0.08* 0.06* 0.07*    RADIOLOGY: Ct Head Wo Contrast 02/19/2015   CLINICAL DATA:  Concern for stroke. Left-sided paralysis. Post catheter directed thrombolysis.  EXAM: CT HEAD WITHOUT CONTRAST  TECHNIQUE: Contiguous axial images were obtained from the base of the skull through the vertex without intravenous contrast.  COMPARISON:  Head CT- 02/19/2015 ; catheter directed thrombolysis  FINDINGS: There is ill-defined blurring of the gray-white junction extending from the right temporal lobe (image 12, series 2) through the right posterior parietal lobe (image 16), compatible with involving infarct. No definite evidence of hemorrhagic conversion.  The gray-white differentiation is otherwise well maintained. Unchanged size and configuration of the ventricles and basilar cisterns. Incidental note is again made of a cavum septum pellucidum. No midline shift. No definite intraparenchymal or extra-axial mass. Intracranial atherosclerosis.  Limited visualization the paranasal sinuses and mastoid air cells is normal. No air-fluid levels. Regional soft tissues appear normal. No displaced calvarial fracture.  IMPRESSION: Evolving right MCA distribution infarct without evidence of hemorrhagic conversion or mass effect/midline shift.   Electronically Signed   By: Sandi Mariscal M.D.   On: 02/19/2015 23:58   Mr  Brain Wo Contrast 02/20/2015   CLINICAL DATA:  Left-sided paralysis. Recent endovascular procedure.  EXAM: MRI HEAD WITHOUT CONTRAST  MRA HEAD WITHOUT CONTRAST  TECHNIQUE: Multiplanar, multiecho pulse sequences of the brain and surrounding structures were obtained without intravenous contrast. Angiographic images of the head were obtained using MRA technique without contrast.  COMPARISON:  CT head 522.  Endovascular procedure 5/22  FINDINGS: MRI HEAD FINDINGS  There is an extensive area of acute infarction affecting most of the RIGHT middle cerebral artery territory including the frontal, temporal,  and parietal lobes, the insular cortex and subcortical white matter, as well as the basal ganglia including the caudate and lentiform nucleus. There is some sparing of the subcortical and periventricular white matter on the RIGHT. A tiny incidental region of cortical and subcortical infarction affects the LEFT lobe.  Generalized atrophy. Chronic microvascular ischemic change. No petechial hemorrhage or foci of chronic hemorrhage. Flow void patency of the RIGHT ICA and MCA has been re-established. Cavum septum pellucidum and vergae. Early right-to-left midline shift estimated 3 mm. No extra-axial fluid collection. No tonsillar or uncal herniation. Extracranial soft tissues grossly unremarkable.  MRA HEAD FINDINGS  Both internal carotid arteries are patent without flow-limiting stenosis. Both middle cerebral arteries are patent without flow-limiting stenosis. Minor proximal oriented LEFT A1 ACA, non flow reducing. Minor irregularity of the proximal RIGHT M2 branches. Basilar artery widely patent with vertebrals codominant. Severe stenosis P2 RIGHT PCA. No cerebellar branch occlusion. No intracranial aneurysm or vascular occlusion.  IMPRESSION: Extensive area of acute infarction affecting most of the RIGHT middle cerebral artery territory, including the basal ganglia. No hemorrhagic transformation.  Tiny incidental area of ischemia LEFT occipital lobe, of no clinical significance.  Early right-to-left shift of 3 mm.  Patency of the proximal vasculature is been re-established with endovascular treatment.   Electronically Signed   By: Rolla Flatten M.D.   On: 02/20/2015 18:01   Dg Chest Port 1 View 02/20/2015   CLINICAL DATA:  81 year old with cough.  EXAM: PORTABLE CHEST - 1 VIEW  COMPARISON:  None.  FINDINGS: Endotracheal tube is 4.4 cm above the carina. Nasogastric tube extends into the abdomen. Negative for a pneumothorax. Calcification in the right lower chest possibly related to a calcified granuloma. There may be  additional calcifications in the right hilum. Findings may represent old granulomatous disease. Heart size is upper limits of normal. Few densities at the left lung base are suggestive for atelectasis but no significant airspace disease.  IMPRESSION: Endotracheal tube is appropriately positioned above the carina.  Suspect old granulomatous disease.   Electronically Signed   By: Markus Daft M.D.   On: 02/20/2015 09:24   ASSESSMENT AND PLAN:  Active Problems:   CVA (cerebral infarction)   Stroke with cerebral ischemia   Acute right MCA stroke   Acute left hemiparesis   Hemi-neglect of left side   Left homonymous hemianopsia   Anosognosia   Complete heart block   Chronic systolic heart failure  1. Complete heart block S/p CRT placement yesterday Device interrogation normal this morning CXR without ptx Left chest without hematoma/ecchymosis Wound care reviewed with patient, routine follow up scheduled  2. Newly diagnosed atrial fibrillation The patient presented with an embolic stroke and has newly diagnosed atrial fibrillation. Will need lifelong anticoagulation with CHADS2VASC score of 7 Will defer timing of anticoagulation to neurology. Would prefer to wait until 48 hours post pacemaker placement to start.   3. Chronic systolic dysfunction/ICM, class 2 chronicall Euvolemic on exam  Continue medical therapy with ACE-I Add Coreg today  4. CAD No recent ischemic symptoms Continue medical therapy  5. CVA Per neurology  6. HTN Stable No change required today  Follow up and instructions entered into AVS.  The patient is stable for DC from an EP standpoint.   Electrophysiology team to see as needed while here. Please call with questions.  Chanetta Marshall, NP 02/23/2015 8:22 AM   EP Attending  Patient seen and examined. Device interogation and CXR reviewed. He is stable for transfer to rehab at this point.   Mikle Bosworth.D.

## 2015-02-23 NOTE — Progress Notes (Signed)
Occupational Therapy Treatment Patient Details Name: Parker [name] MRN: [id number] DOB: [birth year] Today's Date: 02/23/2015    History of present illness 81 yo male admitted with S/s of CVA including L side weakness, decr responsiveness, R gaze prefernce. Pt recieved TpA and IR clot retreival with complete revascularization. MRI (+) R MCA including basal ganglia; L occipital infarct PMH: MI   OT comments  Pt pleasant and eager to participate. Pt with poor attention and decr cognition that affect all aspects of ADLS. Pt currently with sling due to pacemaker placement. Pt reports R shoulder pain and reports decr pain with scapula depression and shoulder flexion facilitated by therapist. Pt states "you get an A" Pt poor recall of previous session or therapist purpose but recognizes therapist. Pt reports "i like you"    Follow Up Recommendations  CIR    Equipment Recommendations  3 in 1 bedside comode;Other (comment)    Recommendations for Other Services Rehab consult    Precautions / Restrictions Precautions Precautions: Fall       Mobility Bed Mobility Overal bed mobility: Needs Assistance Bed Mobility: Supine to Sit     Supine to sit: Mod assist Sit to supine: Min assist (max directional cues on how to scoot up in bed)   General bed mobility comments: max cueing to sequence task and to progress toward eob. pt with delayed processing for task  Transfers Overall transfer level: Needs assistance Equipment used: 1 person hand held assist Transfers: Sit to/from Stand Sit to Stand: Mod assist         General transfer comment: cues for hand placement    Balance Overall balance assessment: Needs assistance         Standing balance support: Single extremity supported Standing balance-Leahy Scale: Fair Standing balance comment: static standing with initial posterior lean                   ADL Overall ADL's : Needs assistance/impaired Eating/Feeding: Minimal  assistance Eating/Feeding Details (indicate cue type and reason): cues for L side mouth clearing. noted to have secretions out of L side of mouth adn decr awareness Grooming: Wash/dry face;Min guard;Sitting             Upper Body Dressing Details (indicate cue type and reason): total (A) to adjust shoulder sling     Toilet Transfer: Moderate assistance;Regular Glass blower/designer Details (indicate cue type and reason): cues for hand placement           General ADL Comments: Pt required mod cueing for bed mobility and cues to prevent L UE use outside of protocol s/p pacemaker. Pt with L inattention. pt reports tv program is "his program" but reports wrong name to program. Pt calls it "lets make a deal " and with further cueing to visually attend reports "oh prices right"       Vision Eye Alignment: Within Functional Limits   Ocular Range of Motion: Restricted on the right;Impaired-to be further tested in functional context Tracking/Visual Pursuits: Impaired - to be further tested in functional context;Unable to hold eye position out of midline;Requires cues, head turns, or add eye shifts to track Saccades: Additional head turns occurred during testing;Impaired - to be further tested in functional context Convergence: Impaired (comment)   Depth Perception: Overshoots Additional Comments: pt consistently did not track to the R eye R Upper quadrant. pt started to track and then eye jumping back to midline. pt with decr attention to task making all assessment difficult .  pt frog jumping with tracking and additional head movements. Pt reaching for therapist hand and squeezing finger when attempting to assess saccades. Pt grinning and eager to participate but attention to task makes visual assessment difficult. Pt closing eye when asked to visually attend to object. Pt asked to open eye and pt states "it is" Pt with decr awareness to R eye occlusion. pt s daughter reports history of  watery R eye and need for drops.    Perception     Praxis      Cognition   Behavior During Therapy: Impulsive Overall Cognitive Status: Impaired/Different from baseline Area of Impairment: Safety/judgement;Attention;Orientation;Memory;Following commands;Problem solving;Awareness Orientation Level: Disoriented to;Situation Current Attention Level: Focused Memory: Decreased recall of precautions;Decreased short-term memory  Following Commands: Follows one step commands inconsistently;Follows one step commands with increased time Safety/Judgement: Decreased awareness of safety;Decreased awareness of deficits Awareness: Intellectual Problem Solving: Slow processing;Decreased initiation;Difficulty sequencing General Comments: Pt continues to have difficulty with visual assessment. pt with brief attention to task and then easy distracted with other task.  Pt reaching out to hold therapist hands when attempting to assess saccades. Pt unaware of pacemaker surg. Pt unaware of reason for sling. Pt reports events that occurred yesterday as physical therapy and that he had a stroke is the reason he has a single. Pt reports stroke as a "mini stroke. Not too bad"    Extremity/Trunk Assessment               Exercises     Shoulder Instructions       General Comments      Pertinent Vitals/ Pain       Pain Assessment: Faces Faces Pain Scale: Hurts a little bit Pain Location: R shoulder discomfort Pain Descriptors / Indicators: Aching Pain Intervention(s): Repositioned;RN gave pain meds during session;Monitored during session  Home Living                                          Prior Functioning/Environment              Frequency Min 3X/week     Progress Toward Goals  OT Goals(current goals can now be found in the care plan section)  Progress towards OT goals: Progressing toward goals  Acute Rehab OT Goals Patient Stated Goal: pt and family are hopeful to  go to CIR OT Goal Formulation: With patient Time For Goal Achievement: 03/08/15 Potential to Achieve Goals: Good ADL Goals Pt Will Perform Grooming: with min assist;standing Pt Will Perform Upper Body Bathing: with min assist;standing Pt Will Transfer to Toilet: with min assist;bedside commode;ambulating Additional ADL Goal #1: pt will complete 2 adl task that requires retrieval of 2 items from L side without cues  Plan Discharge plan remains appropriate    Co-evaluation                 End of Session Equipment Utilized During Treatment: Gait belt   Activity Tolerance Patient tolerated treatment well   Patient Left in bed;with call bell/phone within reach;with bed alarm set;with family/visitor present   Nurse Communication Mobility status;Precautions        Time: 7517-0017 OT Time Calculation (min): 26 min  Charges: OT General Charges $OT Visit: 1 Procedure OT Treatments $Therapeutic Activity: 23-37 mins  Peri Maris 02/23/2015, 12:13 PM Pager: [phone]

## 2015-02-23 NOTE — Progress Notes (Signed)
[facility] PROGRESS NOTE  Parker [name] ZTI:[id number] DOB: 1927/01/09 DOA: 02/19/2015 PCP: No primary care provider on file.  Assessment/Plan:  Active Problems:   CVA (cerebral infarction)   Stroke with cerebral ischemia   Acute right MCA stroke   Acute left hemiparesis   Hemi-neglect of left side   Left homonymous hemianopsia   Anosognosia   Complete heart block   Chronic systolic heart failure   Acute urinary retention   Atrial fibrillation   Benign hypertension  Patient still has Foley catheter in. Will remove and do voiding trial. Flomax. Awaiting decision from inpatient rehabilitation.  HPI/Subjective: Denies pain. No weakness.  Objective: Filed Vitals:   02/23/15 1430  BP: 102/54  Pulse: 70  Temp: 98 F (36.7 C)  Resp: 16    Intake/Output Summary (Last 24 hours) at 02/23/15 1616 Last data filed at 02/23/15 0554  Gross per 24 hour  Intake      0 ml  Output   3475 ml  Net  -3475 ml   Filed Weights   02/19/15 1920 02/19/15 2248 02/23/15 0100  Weight: 82.901 kg (182 lb 12.2 oz) 89.6 kg (197 lb 8.5 oz) 87.8 kg (193 lb 9 oz)    Exam:   General:  Alert, oriented, comfortable.  Cardiovascular: RRR   Respiratory: CTA  Abdomen: S, NT, ND  Ext: Left arm in sling. No edema.  Basic Metabolic Panel:  Recent Labs Lab 02/19/15 1830 02/19/15 1842 02/20/15 0512 02/22/15 0400 02/23/15 0309  NA 140 141 136 138 139  K 4.0 3.9 3.9 3.7 3.5  CL 104 104 103 103 103  CO2 26  --  19* 25 26  GLUCOSE 105* 106* 226* 114* 103*  BUN 13 16 15 12 9   CREATININE 1.12 1.10 1.37* 0.96 0.84  CALCIUM 9.3  --  8.3* 8.8* 8.8*   Liver Function Tests:  Recent Labs Lab 02/19/15 1830  AST 23  ALT 14*  ALKPHOS 97  BILITOT 1.1  PROT 6.9  ALBUMIN 3.8   No results for input(s): LIPASE, AMYLASE in the last 168 hours. No results for input(s): AMMONIA in the last 168 hours. CBC:  Recent Labs Lab 02/19/15 1830 02/19/15 1842 02/20/15 0512 02/22/15 0400  02/23/15 0309  WBC 6.4  --  13.3* 7.7 7.4  NEUTROABS 3.3  --  12.0*  --   --   HGB 17.5* 18.7* 15.5 14.9 15.3  HCT 51.3 55.0* 45.6 44.9 45.4  MCV 92.1  --  91.6 93.3 92.3  PLT 159  --  170 147* 137*   Cardiac Enzymes:  Recent Labs Lab 02/20/15 0953 02/20/15 1524 02/20/15 2100  TROPONINI 0.08* 0.06* 0.07*   BNP (last 3 results) No results for input(s): BNP in the last 8760 hours.  ProBNP (last 3 results) No results for input(s): PROBNP in the last 8760 hours.  CBG:  Recent Labs Lab 02/19/15 1828 02/20/15 2356 02/21/15 1152  GLUCAP 103* 98 133*    Recent Results (from the past 240 hour(s))  MRSA PCR Screening     Status: None   Collection Time: 02/19/15 11:02 PM  Result Value Ref Range Status   MRSA by PCR NEGATIVE NEGATIVE Final    Comment:        The GeneXpert MRSA Assay (FDA approved for NASAL specimens only), is one component of a comprehensive MRSA colonization surveillance program. It is not intended to diagnose MRSA infection nor to guide or monitor treatment for MRSA infections.      Studies:  Dg Chest 2 View  02/23/2015   CLINICAL DATA:  Status post pacemaker placement  EXAM: CHEST  2 VIEW  COMPARISON:  02/20/2015  FINDINGS: Pacing device is now seen on the left. No pneumothorax is seen. Cardiac shadow remains enlarged. Changes of prior granulomatous disease are again noted. The endotracheal tube and nasogastric catheter have been removed in the interval. No focal infiltrate is seen.  IMPRESSION: No acute abnormality noted.   Electronically Signed   By: Inez Catalina M.D.   On: 02/23/2015 08:01    Scheduled Meds: .  stroke: mapping our early stages of recovery book   Does not apply Once  . antiseptic oral rinse  7 mL Mouth Rinse QID  . aspirin EC  325 mg Oral Daily  . atorvastatin  40 mg Oral q1800  . carvedilol  3.125 mg Oral BID WC  . chlorhexidine  15 mL Mouth Rinse BID  . lisinopril  20 mg Oral QHS  . pantoprazole  40 mg Oral Daily  .  tamsulosin  0.4 mg Oral Daily   Continuous Infusions:   Time spent: 15 minutes  [facility] [email or web address], password [facility] 02/23/2015, 4:16 PM  LOS: 4 days

## 2015-02-23 NOTE — PMR Pre-admission (Signed)
PMR Admission Coordinator Pre-Admission Assessment  Patient: Parker [name] is an 81 y.o., male MRN: [id number] DOB: 03-19-1927 Height: 6' 3"  (190.5 cm) Weight: 89.7 kg (197 lb 12 oz)              Insurance Information HMO:      PPO: Yes     PCP:       IPA:       80/20:       OTHER: Group #[id number] PRIMARY: [employer] Medicare Choice      Policy#: [id number]      Subscriber: Nicoletta Dress CM Name: Silvio Pate      Phone#: [phone]     Fax#: [phone] Pre-Cert#: [id number] with update due every 7 days      Employer:  Retired Benefits:  Phone #: [phone]     Name:  Automated Eff. Date: 09/30/12     Deduct: $0      Out of Pocket Max: $4000 ($0 met)      Life Max: unlimited CIR: $160 days 1-10      SNF: $0 days 1-20; $50 days 21-100 Outpatient: with medical necessity     Co-Pay: $20 copay Home Health: 100%      Co-Pay: none DME: 80%     Co-Pay: 20% Providers: in network   Emergency Contact Information Contact Information    Name Relation Home Work Rayle B Daughter [phone]   Ronalee Red Daughter [phone]     Lamberth,[name] Significant other [phone]       Current Medical History  Patient Admitting Diagnosis:  R MCA and L occipital infarct  History of Present Illness: An 81 y.o. right handed male with history of CAD with stenting, hypertension, chronic systolic congestive heart failure, atrial fibrillation maintained on aspirin. Independent and active prior to admission. He presented 02/19/2015 with left-sided weakness altered mental status and right gaze preference. 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 Patient did receive TPA. MRI and MRA showed extensive area of acute infarct right middle cerebral artery territory as well as left occipital small infarct. Echocardiogram with ejection fraction of 60% grade 2 diastolic dysfunction. Findings of occluded right MCA M1  segment underwent complete revascularization for interventional radiology. Patient did require ventilatory support for a short time. EKG with atrial fibrillation with complete heart block as well as bouts of bradycardia. Cardiology consulted.  Currently maintained on aspirin 325 mg daily for CVA prophylaxis as well as subcutaneous Lovenox for DVT prophylaxis. A pacemaker was placed in light of heart block and bradycardia 02/22/2015 per Dr. Lovena Le. Tolerating a regular consistency diet. Physical therapy evaluation completed 02/21/2015 with recommendations of physical medicine rehabilitation consult     Total: 3=NIH  Past Medical History  Past Medical History  Diagnosis Date  . MI (myocardial infarction)   . Hypertension   . Hyperlipidemia   . Bradycardia     per girlfriend, has refused pacemaker   . Chronic systolic heart failure     35%-45%,  2013  . Head and neck cancer     surgical resection, no XRT  . Atrial fibrillation     Family History  family history includes Cancer in his father and paternal grandfather; Heart attack (age of onset: 70) in his mother.  Prior Rehab/Hospitalizations:  Has the patient had major surgery during 100 days prior to admission? No  Current Medications   Current facility-administered medications:  .   stroke: mapping our early stages  of recovery book, , Does not apply, Once, Amie Portland, MD .  acetaminophen (TYLENOL) tablet 325-650 mg, 325-650 mg, Oral, Q4H PRN, Evans Lance, MD, 650 mg at 02/23/15 1110 .  antiseptic oral rinse (CPC / CETYLPYRIDINIUM CHLORIDE 0.05%) solution 7 mL, 7 mL, Mouth Rinse, QID, Garvin Fila, MD, 7 mL at 02/22/15 1600 .  aspirin EC tablet 325 mg, 325 mg, Oral, Daily, 325 mg at 02/24/15 0917 **OR** [DISCONTINUED] aspirin suppository 300 mg, 300 mg, Rectal, Daily, Greta Doom, MD, 300 mg at 02/21/15 1012 .  atorvastatin (LIPITOR) tablet 40 mg, 40 mg, Oral, q1800, Luella Cook, MD, 40 mg at 02/24/15 1497 .   carvedilol (COREG) tablet 3.125 mg, 3.125 mg, Oral, BID WC, Patsey Berthold, NP, 3.125 mg at 02/24/15 0918 .  chlorhexidine (PERIDEX) 0.12 % solution 15 mL, 15 mL, Mouth Rinse, BID, Garvin Fila, MD, 15 mL at 02/24/15 0930 .  hydrALAZINE (APRESOLINE) injection 10 mg, 10 mg, Intravenous, Q4H PRN, Rosalin Hawking, MD .  lisinopril (PRINIVIL,ZESTRIL) tablet 20 mg, 20 mg, Oral, QHS, Rosalin Hawking, MD, 20 mg at 02/24/15 0917 .  ondansetron [facility]) injection 4 mg, 4 mg, Intravenous, Q6H PRN, Evans Lance, MD, 4 mg at 02/22/15 2034 .  pantoprazole (PROTONIX) EC tablet 40 mg, 40 mg, Oral, Daily, Rosalin Hawking, MD, 40 mg at 02/24/15 0918 .  senna-docusate (Senokot-S) tablet 1 tablet, 1 tablet, Oral, QHS PRN, Amie Portland, MD .  tamsulosin (FLOMAX) capsule 0.4 mg, 0.4 mg, Oral, Daily, Delfina Redwood, MD, 0.4 mg at 02/24/15 0263  Patients Current Diet: DIET SOFT Room service appropriate?: Yes; Fluid consistency:: Thin  Precautions / Restrictions Precautions Precautions: Fall Restrictions Weight Bearing Restrictions: No   Has the patient had 2 or more falls or a fall with injury in the past year?No  Prior Activity Level Community (5-7x/wk): Went out daily.  Droves from girlfriend's condo to his home daily to feed his dog, Jeanmarie Plant and get his mail.  It is a 10-15 minute drive.  Jeanmarie Plant is a Economist.  Home Assistive Devices / Equipment Home Assistive Devices/Equipment: Dentures (specify type) Home Equipment: None  Prior Device Use: Indicate devices/aids used by the patient prior to current illness, exacerbation or injury? None of the above.  Did not use a device, but walked slowly.  Prior Functional Level Prior Function Level of Independence: Independent Comments: Independent, drove, ran errands, etc.  Self Care: Did the patient need help bathing, dressing, using the toilet or eating?  Independent  Indoor Mobility: Did the patient need assistance with walking from room to room  (with or without device)? Independent  Stairs: Did the patient need assistance with internal or external stairs (with or without device)? Independent  Functional Cognition: Did the patient need help planning regular tasks such as shopping or remembering to take medications? Independent.  Girlfriend reports that patient is developing mild dementia, but she has not had to assist patient at home PTA.  Current Functional Level Cognition  Arousal/Alertness: Awake/alert Overall Cognitive Status: Impaired/Different from baseline Current Attention Level: Focused Orientation Level: Oriented X4 Following Commands: Follows one step commands inconsistently, Follows one step commands with increased time Safety/Judgement: Decreased awareness of safety, Decreased awareness of deficits General Comments: Pt continues to have difficulty with visual assessment. pt with brief attention to task and then easy distracted with other task.  Pt reaching out to hold therapist hands when attempting to assess saccades. Pt unaware of pacemaker surg. Pt unaware  of reason for sling. Pt reports events that occurred yesterday as physical therapy and that he had a stroke is the reason he has a single. Pt reports stroke as a "mini stroke. Not too bad" Attention: Sustained Sustained Attention: Impaired Sustained Attention Impairment: Verbal basic, Functional basic Memory: Impaired Memory Impairment: Storage deficit, Retrieval deficit, Decreased short term memory Decreased Short Term Memory: Verbal basic Awareness: Impaired Awareness Impairment: Intellectual impairment Problem Solving: Impaired Problem Solving Impairment: Verbal basic Behaviors: Impulsive, Perseveration Safety/Judgment: Impaired    Extremity Assessment (includes Sensation/Coordination)  Upper Extremity Assessment: LUE deficits/detail LUE Deficits / Details: decr attention, 4 out 5 MMT, drift L UE LUE Sensation: decreased proprioception  Lower Extremity  Assessment: Defer to PT evaluation RLE Deficits / Details: mild weakness may be expained by distractability LLE Deficits / Details: mildly weaker and less coordinated than R, but still difficult to MMT due to decreased ability to follow direction.    ADLs  Overall ADL's : Needs assistance/impaired Eating/Feeding: Minimal assistance Eating/Feeding Details (indicate cue type and reason): cues for L side mouth clearing. noted to have secretions out of L side of mouth adn decr awareness Grooming: Wash/dry face, Min guard, Sitting Lower Body Bathing: Moderate assistance, Sit to/from stand Upper Body Dressing Details (indicate cue type and reason): total (A) to adjust shoulder sling Toilet Transfer: Moderate assistance, Regular Toilet Toilet Transfer Details (indicate cue type and reason): cues for hand placement Functional mobility during ADLs: Moderate assistance, Rolling walker General ADL Comments: Pt required mod cueing for bed mobility and cues to prevent L UE use outside of protocol s/p pacemaker. Pt with L inattention. pt reports tv program is "his program" but reports wrong name to program. Pt calls it "lets make a deal " and with further cueing to visually attend reports "oh prices right"     Mobility  Overal bed mobility: Needs Assistance Bed Mobility: Supine to Sit Supine to sit: Mod assist Sit to supine: Min assist (max directional cues on how to scoot up in bed) General bed mobility comments: max cueing to sequence task and to progress toward eob. pt with delayed processing for task    Transfers  Overall transfer level: Needs assistance Equipment used: 1 person hand held assist Transfers: Sit to/from Stand Sit to Stand: Mod assist General transfer comment: cues for hand placement    Ambulation / Gait / Stairs / Wheelchair Mobility  Ambulation/Gait Ambulation/Gait assistance: Min assist Ambulation Distance (Feet): 20 Feet (to/from bathroom) Assistive device: 1 person hand  held assist General Gait Details: very short, shuffling steps and tending to reach out fro UE support with amb in tight quarters to bathroom; tending to stagger Gait Pattern/deviations: Shuffle, Trunk flexed Gait velocity: slower    Posture / Balance Balance Overall balance assessment: Needs assistance Sitting-balance support: Bilateral upper extremity supported, Feet supported Sitting balance-Leahy Scale: Fair Standing balance support: Single extremity supported Standing balance-Leahy Scale: Fair Standing balance comment: static standing with initial posterior lean Standardized Balance Assessment Standardized Balance Assessment : Berg Balance Test Berg Balance Test Sit to Stand: Able to stand using hands after several tries Standing Unsupported: Able to stand 2 minutes with supervision Sitting with Back Unsupported but Feet Supported on Floor or Stool: Able to sit safely and securely 2 minutes Stand to Sit: Controls descent by using hands Transfers: Able to transfer safely, definite need of hands Standing Unsupported with Eyes Closed: Able to stand 3 seconds Standing Ubsupported with Feet Together: Needs help to attain position but able to stand  for 30 seconds with feet together From Standing, Reach Forward with Outstretched Arm: Can reach forward >12 cm safely (5") From Standing Position, Pick up Object from Floor: Unable to try/needs assist to keep balance From Standing Position, Turn to Look Behind Over each Shoulder: Turn sideways only but maintains balance Turn 360 Degrees: Needs close supervision or verbal cueing Standing Unsupported, Alternately Place Feet on Step/Stool: Needs assistance to keep from falling or unable to try Standing Unsupported, One Foot in Front: Needs help to step but can hold 15 seconds Standing on One Leg: Tries to lift leg/unable to hold 3 seconds but remains standing independently Total Score: 26    Special needs/care consideration BiPAP/CPAP No CPM  No Continuous Drip IV 0.9% NS 50 ml/hr Dialysis No      Life Vest No Oxygen None at home Special Bed No Trach Size No Wound Vac (area) No      Skin No                             Bowel mgmt: Last documented BM 02/21/15 Bladder mgmt: urinary catheter in place Diabetic mgmt Borderline DM, not on medications at home.    Previous Home Environment Living Arrangements: Spouse/significant other  Lives With: Significant other Available Help at Discharge: Family Type of Home: Other(Comment) Home Layout: One level Home Access: Building control surveyor Shower/Tub: Multimedia programmer: Petronila: No  Discharge Living Setting Plans for Discharge Living Setting: Lives with (comment), Apartment (Lives with girlfriend in [place] ) Type of Home at Discharge: Apartment (Third floor condo with an elevator in the building.) Discharge Home Layout: One level (On the third floor, elevator in building.) Discharge Home Access: Level entry, Elevator (Small step down from sidewalk to pavement for car.) Does the patient have any problems obtaining your medications?: No  Social/Family/Support Systems Patient Roles: Parent, Other (Comment) (Has a girlfriend and 2 daughters.) Contact Information: Purnell Shoemaker - daughter (c) [phone] Anticipated Caregiver: Sharia Reeve - girlfriend Anticipated Caregiver's Contact Information: [name] - girlfriend (h) [phone] Ability/Limitations of Caregiver: Girlfriend takes care of patient, gets him meals, and patient stays with his girlfriend. Caregiver Availability: 24/7 Discharge Plan Discussed with Primary Caregiver: Yes Is Caregiver In Agreement with Plan?: Yes Does Caregiver/Family have Issues with Lodging/Transportation while Pt is in Rehab?: No  Goals/Additional Needs Patient/Family Goal for Rehab: PT/OT/ST supervision goals Expected length of stay: 10-14 days Cultural Considerations: [employer].  Enjoys watching Dr. Karsten Ro  on TV. Dietary Needs: Heart, thin liquids diet Equipment Needs: TBD Pt/Family Agrees to Admission and willing to participate: Yes Program Orientation Provided & Reviewed with Pt/Caregiver Including Roles  & Responsibilities: Yes  Decrease burden of Care through IP rehab admission: N/A  Possible need for SNF placement upon discharge: Not planned  Patient Condition: This patient's medical and functional status has changed since the consult dated: 02/22/15 in which the Rehabilitation Physician determined and documented that the patient's condition is appropriate for intensive rehabilitative care in an inpatient rehabilitation facility. See "History of Present Illness" (above) for medical update. Functional changes are: Currently requiring mod assist for transfers and min assist to ambulate 20 ft +1 HHA. Patient's medical and functional status update has been discussed with the Rehabilitation physician and patient remains appropriate for inpatient rehabilitation. Will admit to inpatient rehab today.  Preadmission Screen Completed By:  Retta Diones, 02/24/2015 10:42 AM ______________________________________________________________________   Discussed status with Dr. Letta Pate on 02/24/15 at 1040  and received telephone approval for admission today.  Admission Coordinator:  Retta Diones, time1040/Date05/27/16

## 2015-02-23 NOTE — Progress Notes (Signed)
STROKE TEAM PROGRESS NOTE   SUBJECTIVE (INTERVAL HISTORY) No family is at the bedside. Had pacemaker placed yesterday. Pacemaker function normal. Currently heart rate 70. Waiting for CIR placement. Will start anticoagulation on Monday.  OBJECTIVE Temp:  [97.9 F (36.6 C)-98.5 F (36.9 C)] 98.5 F (36.9 C) (05/26 1948) Pulse Rate:  [69-70] 70 (05/26 1948) Cardiac Rhythm:  [-] A-V Sequential paced (05/26 0745) Resp:  [14-20] 19 (05/26 1948) BP: (99-199)/(54-105) 129/61 mmHg (05/26 1948) SpO2:  [92 %-100 %] 92 % (05/26 1948) Weight:  [193 lb 9 oz (87.8 kg)] 193 lb 9 oz (87.8 kg) (05/26 0100)   Recent Labs Lab 02/19/15 1828 02/20/15 2356 02/21/15 1152  GLUCAP 103* 98 133*    Recent Labs Lab 02/19/15 1830 02/19/15 1842 02/20/15 0512 02/22/15 0400 02/23/15 0309  NA 140 141 136 138 139  K 4.0 3.9 3.9 3.7 3.5  CL 104 104 103 103 103  CO2 26  --  19* 25 26  GLUCOSE 105* 106* 226* 114* 103*  BUN 13 16 15 12 9   CREATININE 1.12 1.10 1.37* 0.96 0.84  CALCIUM 9.3  --  8.3* 8.8* 8.8*    Recent Labs Lab 02/19/15 1830  AST 23  ALT 14*  ALKPHOS 97  BILITOT 1.1  PROT 6.9  ALBUMIN 3.8    Recent Labs Lab 02/19/15 1830 02/19/15 1842 02/20/15 0512 02/22/15 0400 02/23/15 0309  WBC 6.4  --  13.3* 7.7 7.4  NEUTROABS 3.3  --  12.0*  --   --   HGB 17.5* 18.7* 15.5 14.9 15.3  HCT 51.3 55.0* 45.6 44.9 45.4  MCV 92.1  --  91.6 93.3 92.3  PLT 159  --  170 147* 137*    Recent Labs Lab 02/20/15 0953 02/20/15 1524 02/20/15 2100  TROPONINI 0.08* 0.06* 0.07*   No results for input(s): LABPROT, INR in the last 72 hours. No results for input(s): COLORURINE, LABSPEC, [place], GLUCOSEU, HGBUR, BILIRUBINUR, KETONESUR, PROTEINUR, UROBILINOGEN, NITRITE, LEUKOCYTESUR in the last 72 hours.  Invalid input(s): APPERANCEUR     Component Value Date/Time   CHOL 114 02/20/2015 0511   TRIG 71 02/20/2015 0511   HDL 44 02/20/2015 0511   CHOLHDL 2.6 02/20/2015 0511   VLDL 14 02/20/2015  0511   LDLCALC 56 02/20/2015 0511   Lab Results  Component Value Date   HGBA1C 6.1* 02/20/2015   No results found for: LABOPIA, [place], [place], [place], [facility], [place]   Recent Labs Lab 02/19/15 [place] <5   I have personally reviewed the radiological images below and agree with the radiology interpretations.  Ct Head Wo Contrast 02/19/2015  IMPRESSION: Evolving right MCA distribution infarct without evidence of hemorrhagic conversion or mass effect/midline shift.   02/19/2015   IMPRESSION: Findings concerning for early RIGHT middle cerebral artery territory infarct which could be confirmed on MRI of the brain with diffusion-weighted sequences as clinically indicated. There is a component of probable hemoconcentration.  Otherwise normal noncontrast CT of the head for age.   MRI and MRA  Extensive area of acute infarction affecting most of the RIGHT middle cerebral artery territory, including the basal ganglia. No hemorrhagic transformation. Tiny incidental area of ischemia LEFT occipital lobe, of no clinical significance. Early right-to-left shift of 3 mm. Patency of the proximal vasculature is been re-established with endovascular treatment.  2D Echocardiogram  - Left ventricle: The cavity size was moderately dilated. There was mild focal basal hypertrophy of the septum. Systolic function wasseverely reduced. The estimated ejection fraction was in  therange of 25% to 30%. Diffuse hypokinesis. Features are consistent with a pseudonormal left ventricular filling pattern, withconcomitant abnormal relaxation and increased filling pressure(grade 2 diastolic dysfunction). Doppler parameters areconsistent with elevated ventricular end-diastolic fillingpressure. - Aortic valve: Moderately thickened, moderately calcified leaflets. Transvalvular velocity was within the normal range. There was no stenosis. There was trivial regurgitation. - Aortic root: The aortic root was normal  in size. - Ascending aorta: The ascending aorta was normal in size. - Mitral valve: Calcified annulus. Mildly thickened leaflets . There was moderate regurgitation. - Left atrium: The atrium was mildly dilated. - Right ventricle: The cavity size was normal. Wall thickness was normal. Systolic function was normal. - Right atrium: The atrium was moderately dilated. - Tricuspid valve: There was moderate regurgitation. - Pulmonary arteries: Systolic pressure was severely increased. PA peak pressure: 60 mm Hg (S).  CXR - 02/20/15 Endotracheal tube is appropriately positioned above the carina. Suspect old granulomatous disease.  EKG  sinus bradycardia. For complete results please see formal report.   PHYSICAL EXAM General - Well nourished, well developed, in no apparent distress.  Ophthalmologic - Fundi not visualized due to noncooperation.  Cardiovascular - bradycardia, regular rhythm.  Mental Status -  Awake alert orientated to place and people but not to time or age. Language including expression, naming, repetition, comprehension was assessed and found intact.  Cranial Nerves II - XII - II - Visual field intact OU. Left eyelid apraxia. III, IV, VI - Extraocular movements intact. V - Facial sensation intact bilaterally. VII - left facial droop. VIII - Hearing & vestibular intact bilaterally. X - Palate elevates symmetrically. XI - Chin turning & shoulder shrug intact bilaterally. XII - Tongue protrusion intact.  Motor Strength - The patient's strength was 4/5 LUE and LLE with pronator drift on the left.  Bulk was normal and fasciculations were absent.   Motor Tone - Muscle tone was assessed at the neck and appendages and was normal.  Reflexes - The patient's reflexes were 1+ in all extremities and he had no pathological reflexes.  Sensory - Light touch, temperature/pinprick were assessed and were symmetrical.    Coordination - The patient had normal movements in the hands  with no ataxia or dysmetria.  Tremor was absent.  Gait and Station - deferred due to safety concerns.   ASSESSMENT/PLAN [name] is a 81 y.o. male with history of CAD s/p stenting admitted for aphasia and left sided weakness. Received tPA and mechanical thrombectomy. Symptoms rapid improving.    Stroke:  Non-dominant right large MCA territory infarct as well as left occipital small infarct, embolic secondary to atrial fibrillation. S/p tPA and intervention.   MRI  Right large MCA infarct as well as left occipital small infarct  MRA  Right M2 stenosis  Cerebral angio showed right M1 cut off. After thrombectomy, TICI 3 recannulization.  2D Echo  EF 25-30%  LDL 56  HgbA1c 6.1  lovenox subq for VTE prophylaxis  DIET SOFT Room service appropriate?: Yes; Fluid consistency:: Thin   aspirin 81 mg orally every day prior to admission, now on ASA 325mg . Will need to start anticoagulation Monday 02/27/2015 due to large stroke and high risk of hemorrhagic transformation which also give time for pacemaker placement.  Ongoing aggressive stroke risk factor management  Therapy recommendations:  CIR. Consult pending.  Disposition:  pending CIR placement  Atrial fibrillation with complete AV block   HR at 30s  Asymptomatic  Pt was offered pacer as outpt in the  past but he refused.  Will need to start anticoagulation Monday 02/27/2015 due to large stroke and high risk of hemorrhagic transformation which also give time for pacemaker placement.  Pacemaker placed by cardiology  Cardiomyopathy with complete AV block  EF 25-30%  Pt followed cardiologist in [place]  Was offered pacer in the past that he refused. He is now agreeable to placement.  Pacemaker placed by cardiology  Hypertension  Home meds:   Lisinopril   BP goal - gradually normotensive  Stable  Home meds resumed.  Hyperlipidemia  Home meds:  zocor 20   On lipitor 40mg  now  LDL 56, goal <  70  Continue statin at discharge  CAD s/p stenting  Trend troponin - down trending, not elevated  On ASA 325 for now  Other Stroke Risk Factors  Advanced age  Coronary artery disease  Other Active Problems  Mild leukocytosis  Urinary retention. Foley placed 5/24. Will do trial voids in a day or so.  Hospital day # 4   Rosalin Hawking, MD PhD Stroke Neurology 02/23/2015 7:50 PM   To contact Stroke Continuity provider, please refer to [email or web address] After hours, contact General Neurology

## 2015-02-24 ENCOUNTER — Inpatient Hospital Stay (HOSPITAL_COMMUNITY)
Admission: AD | Admit: 2015-02-24 | Discharge: 2015-03-08 | DRG: 057 | Disposition: A | Discharge status: Home Health | Payer: Medicare PPO | Source: Intra-hospital | Attending: Physical Medicine & Rehabilitation | Admitting: Physical Medicine & Rehabilitation

## 2015-02-24 DIAGNOSIS — I63511 Cerebral infarction due to unspecified occlusion or stenosis of right middle cerebral artery: Secondary | ICD-10-CM

## 2015-02-24 DIAGNOSIS — I2510 Atherosclerotic heart disease of native coronary artery without angina pectoris: Secondary | ICD-10-CM | POA: Diagnosis present

## 2015-02-24 DIAGNOSIS — E785 Hyperlipidemia, unspecified: Secondary | ICD-10-CM | POA: Diagnosis present

## 2015-02-24 DIAGNOSIS — Z955 Presence of coronary angioplasty implant and graft: Secondary | ICD-10-CM

## 2015-02-24 DIAGNOSIS — I5022 Chronic systolic (congestive) heart failure: Secondary | ICD-10-CM | POA: Diagnosis present

## 2015-02-24 DIAGNOSIS — I4891 Unspecified atrial fibrillation: Secondary | ICD-10-CM | POA: Diagnosis present

## 2015-02-24 DIAGNOSIS — K5900 Constipation, unspecified: Secondary | ICD-10-CM | POA: Diagnosis present

## 2015-02-24 DIAGNOSIS — R338 Other retention of urine: Secondary | ICD-10-CM

## 2015-02-24 DIAGNOSIS — I69392 Facial weakness following cerebral infarction: Secondary | ICD-10-CM

## 2015-02-24 DIAGNOSIS — H53462 Homonymous bilateral field defects, left side: Secondary | ICD-10-CM | POA: Diagnosis present

## 2015-02-24 DIAGNOSIS — H5711 Ocular pain, right eye: Secondary | ICD-10-CM | POA: Diagnosis not present

## 2015-02-24 DIAGNOSIS — I252 Old myocardial infarction: Secondary | ICD-10-CM | POA: Diagnosis not present

## 2015-02-24 DIAGNOSIS — Z87891 Personal history of nicotine dependence: Secondary | ICD-10-CM | POA: Diagnosis not present

## 2015-02-24 DIAGNOSIS — I6931 Cognitive deficits following cerebral infarction: Secondary | ICD-10-CM | POA: Diagnosis not present

## 2015-02-24 DIAGNOSIS — Z950 Presence of cardiac pacemaker: Secondary | ICD-10-CM

## 2015-02-24 DIAGNOSIS — I5020 Unspecified systolic (congestive) heart failure: Secondary | ICD-10-CM | POA: Diagnosis not present

## 2015-02-24 DIAGNOSIS — R414 Neurologic neglect syndrome: Secondary | ICD-10-CM | POA: Diagnosis present

## 2015-02-24 DIAGNOSIS — N400 Enlarged prostate without lower urinary tract symptoms: Secondary | ICD-10-CM | POA: Diagnosis not present

## 2015-02-24 DIAGNOSIS — I5030 Unspecified diastolic (congestive) heart failure: Secondary | ICD-10-CM | POA: Diagnosis not present

## 2015-02-24 DIAGNOSIS — I69354 Hemiplegia and hemiparesis following cerebral infarction affecting left non-dominant side: Secondary | ICD-10-CM | POA: Diagnosis present

## 2015-02-24 DIAGNOSIS — I6340 Cerebral infarction due to embolism of unspecified cerebral artery: Secondary | ICD-10-CM

## 2015-02-24 DIAGNOSIS — I10 Essential (primary) hypertension: Secondary | ICD-10-CM | POA: Diagnosis present

## 2015-02-24 DIAGNOSIS — I119 Hypertensive heart disease without heart failure: Secondary | ICD-10-CM | POA: Diagnosis not present

## 2015-02-24 DIAGNOSIS — I482 Chronic atrial fibrillation: Secondary | ICD-10-CM

## 2015-02-24 DIAGNOSIS — R4189 Other symptoms and signs involving cognitive functions and awareness: Secondary | ICD-10-CM

## 2015-02-24 DIAGNOSIS — G8190 Hemiplegia, unspecified affecting unspecified side: Secondary | ICD-10-CM

## 2015-02-24 LAB — CBC
HCT: 44.7 % (ref 39.0–52.0)
Hemoglobin: 15.2 g/dL (ref 13.0–17.0)
MCH: 31.2 pg (ref 26.0–34.0)
MCHC: 34 g/dL (ref 30.0–36.0)
MCV: 91.8 fL (ref 78.0–100.0)
Platelets: 125 10*3/uL — ABNORMAL LOW (ref 150–400)
RBC: 4.87 MIL/uL (ref 4.22–5.81)
RDW: 13.9 % (ref 11.5–15.5)
WBC: 6.6 10*3/uL (ref 4.0–10.5)

## 2015-02-24 LAB — BASIC METABOLIC PANEL WITH GFR
Anion gap: 11 (ref 5–15)
BUN: 13 mg/dL (ref 6–20)
CO2: 25 mmol/L (ref 22–32)
Calcium: 8.7 mg/dL — ABNORMAL LOW (ref 8.9–10.3)
Chloride: 101 mmol/L (ref 101–111)
Creatinine, Ser: 0.98 mg/dL (ref 0.61–1.24)
GFR calc Af Amer: >60 mL/min
GFR calc non Af Amer: >60 mL/min
Glucose, Bld: 105 mg/dL — ABNORMAL HIGH (ref 65–99)
Potassium: 3.7 mmol/L (ref 3.5–5.1)
Sodium: 137 mmol/L (ref 135–145)

## 2015-02-24 MED ORDER — SENNOSIDES-DOCUSATE SODIUM 8.6-50 MG PO TABS: 1.0000 | ORAL_TABLET | Freq: Every evening | ORAL | Status: DC | PRN

## 2015-02-24 MED ORDER — ONDANSETRON HCL 4 MG PO TABS: 4.0000 mg | ORAL_TABLET | Freq: Four times a day (QID) | ORAL | Status: DC | PRN

## 2015-02-24 MED ORDER — TAMSULOSIN HCL 0.4 MG PO CAPS
0.4000 mg | ORAL_CAPSULE | Freq: Every day | ORAL | Status: DC
  Administered 2015-02-25 – 2015-03-08 (×12): 0.4 mg via ORAL
  Filled 2015-02-24 (×13): qty 1

## 2015-02-24 MED ORDER — ATORVASTATIN CALCIUM 40 MG PO TABS
40.0000 mg | ORAL_TABLET | Freq: Every day | ORAL | Status: DC
  Administered 2015-02-25 – 2015-03-07 (×11): 40 mg via ORAL
  Filled 2015-02-24 (×13): qty 1

## 2015-02-24 MED ORDER — ONDANSETRON HCL 4 MG/2ML IJ SOLN: 4.0000 mg | Freq: Four times a day (QID) | INTRAMUSCULAR | Status: DC | PRN

## 2015-02-24 MED ORDER — ACETAMINOPHEN 325 MG PO TABS
325.0000 mg | ORAL_TABLET | ORAL | Status: DC | PRN
  Administered 2015-02-28 – 2015-03-01 (×2): 650 mg via ORAL
  Filled 2015-02-24 (×2): qty 2

## 2015-02-24 MED ORDER — LISINOPRIL 20 MG PO TABS
20.0000 mg | ORAL_TABLET | Freq: Every day | ORAL | Status: DC
  Administered 2015-02-24 – 2015-03-07 (×12): 20 mg via ORAL
  Filled 2015-02-24 (×13): qty 1

## 2015-02-24 MED ORDER — PANTOPRAZOLE SODIUM 40 MG PO TBEC
40.0000 mg | DELAYED_RELEASE_TABLET | Freq: Every day | ORAL | Status: DC
  Administered 2015-02-25 – 2015-03-08 (×12): 40 mg via ORAL
  Filled 2015-02-24 (×13): qty 1

## 2015-02-24 MED ORDER — CARVEDILOL 3.125 MG PO TABS
3.1250 mg | ORAL_TABLET | Freq: Two times a day (BID) | ORAL | Status: DC
  Administered 2015-02-24 – 2015-03-08 (×24): 3.125 mg via ORAL
  Filled 2015-02-24 (×26): qty 1

## 2015-02-24 MED ORDER — ASPIRIN EC 325 MG PO TBEC
325.0000 mg | DELAYED_RELEASE_TABLET | Freq: Every day | ORAL | Status: DC
  Administered 2015-02-25 – 2015-03-08 (×12): 325 mg via ORAL
  Filled 2015-02-24 (×13): qty 1

## 2015-02-24 MED ORDER — SORBITOL 70 % SOLN: 30.0000 mL | Freq: Every day | Status: DC | PRN

## 2015-02-24 NOTE — Progress Notes (Signed)
Rehab admissions - I have approval for acute inpatient rehab admission for today.  I met with patient, daughter and girlfriend.  All are in agreement to inpatient rehab admission.  Bed available and will admit to inpatient rehab today.  Call me for questions.  #[phone]

## 2015-02-24 NOTE — Progress Notes (Signed)
Parker Blake, MD Physician Signed Physical Medicine and Rehabilitation Consult Note 02/22/2015 10:18 AM  Related encounter: ED to Hosp-Admission (Current) from 02/19/2015 in [facility] Collapse All        Physical Medicine and Rehabilitation Consult Reason for Consult: Right MCA infarct as well as left occipital small infarct Referring Physician: Dr.Xu   HPI: Parker [name] is a 81 y.o. right handed male with history of CAD with stenting, hypertension, chronic systolic congestive heart failure, atrial fibrillation maintained on aspirin. Independent and active prior to admission. He presented 02/19/2015 with left-sided weakness altered mental status and right gaze preference. 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 Patient did receive TPA. MRI and MRA showed extensive area of acute infarct right middle cerebral artery territory as well as left occipital small infarct. Echocardiogram with ejection fraction of 40% grade 2 diastolic dysfunction. Findings of occluded right MCA M1 segment underwent complete revascularization for interventional radiology. Patient did require ventilatory support for a short time. EP consult for question need of TEE possible loop recorder due to atrial fibrillation/bradycardia. Currently maintained on aspirin 325 mg daily for CVA prophylaxis as well as subcutaneous Lovenox for DVT prophylaxis. Tolerating a regular consistency diet. Physical therapy evaluation completed 02/21/2015 with recommendations of physical medicine rehabilitation consult   Pt has decided to undergo PPM placement today.  Review of Systems  Cardiovascular: Positive for palpitations.  Gastrointestinal: Positive for constipation.  Musculoskeletal: Positive for back pain.  Neurological: Positive for weakness.  All other systems reviewed and are negative.  Past Medical History  Diagnosis Date  . MI  (myocardial infarction)   . Hypertension   . Hyperlipidemia   . Bradycardia     per girlfriend, has refused pacemaker   . Chronic systolic heart failure     35%-45%, 2013  . Head and neck cancer     surgical resection, no XRT  . Atrial fibrillation    Past Surgical History  Procedure Laterality Date  . Back surgery    . Radiology with anesthesia N/A 02/19/2015    Procedure: RADIOLOGY WITH ANESTHESIA; Surgeon: Luanne Bras, MD; Location: [facility]; Service: Radiology; Laterality: N/A;   Family History  Problem Relation Age of Onset  . Heart attack Mother 28  . Cancer Father   . Cancer Paternal Grandfather    Social History:  reports that he has quit smoking. He does not have any smokeless tobacco history on file. He reports that he does not drink alcohol or use illicit drugs. Allergies: No Known Allergies Medications Prior to Admission  Medication Sig Dispense Refill  . aspirin EC 81 MG tablet Take 81 mg by mouth daily.    Marland Kitchen lisinopril (PRINIVIL,ZESTRIL) 20 MG tablet Take 20 mg by mouth at bedtime.     Marland Kitchen omeprazole (PRILOSEC) 20 MG capsule Take 20 mg by mouth daily.    . simvastatin (ZOCOR) 20 MG tablet Take 20 mg by mouth at bedtime.      Home: Home Living Family/patient expects to be discharged to:: Private residence Living Arrangements: Spouse/significant other Available Help at Discharge: Family Type of Home: Other(Comment) Home Access: Elevator Home Layout: One level Home Equipment: None Lives With: Significant other  Functional History: Prior Function Level of Independence: Independent Comments: Independent, drove, ran errands, etc. Functional Status:  Mobility: Bed Mobility Overal bed mobility: Needs Assistance Bed Mobility: Supine to Sit Supine to sit: Min assist General bed mobility comments: Pt  slow to intiate movement due to unable to understand the task "sit up  at EOB right here" TC to guide and minimal steady assist Transfers Overall transfer level: Needs assistance Transfers: Sit to/from Stand Sit to Stand: Min assist General transfer comment: steady assist; vc and demo. for hand placement Ambulation/Gait Ambulation/Gait assistance: Min assist Ambulation Distance (Feet): 80 Feet Assistive device: None General Gait Details: mildly unsteady gait through out. pt grabbing for the wall, people for stability Gait Pattern/deviations: Step-through pattern, Decreased step length - right, Decreased step length - left, Trunk flexed, Wide base of support Gait velocity: slower    ADL: ADL Overall ADL's : Needs assistance/impaired Eating/Feeding: Minimal assistance, Cueing for safety, Sitting Eating/Feeding Details (indicate cue type and reason): pt reports "feels lumpy". Pt requesting additional options. Pt provided cereal. Pt eating cereal dry initally and OT cued to add milk. Pt taking large bites Grooming: Wash/dry hands, Minimal assistance, Sitting Lower Body Bathing: Moderate assistance, Sit to/from stand Toilet Transfer: Moderate assistance, Ambulation, RW, BSC Toilet Transfer Details (indicate cue type and reason): cues for safety. pt requesting toilet transfer with foley in place. Pt not voiding bowels  Functional mobility during ADLs: Moderate assistance, Rolling walker  Cognition: Cognition Overall Cognitive Status: Impaired/Different from baseline Arousal/Alertness: Awake/alert Orientation Level: Oriented X4 Attention: Sustained Sustained Attention: Impaired Sustained Attention Impairment: Verbal basic, Functional basic Memory: Impaired Memory Impairment: Storage deficit, Retrieval deficit, Decreased short term memory Decreased Short Term Memory: Verbal basic Awareness: Impaired Awareness Impairment: Intellectual impairment Problem Solving: Impaired Problem Solving Impairment: Verbal basic Behaviors: Impulsive,  Perseveration Safety/Judgment: Impaired Cognition Arousal/Alertness: Awake/alert Behavior During Therapy: Impulsive Overall Cognitive Status: Impaired/Different from baseline Area of Impairment: Orientation, Attention, Memory, Safety/judgement, Following commands, Awareness, Problem solving Orientation Level: Disoriented to, Time (reported tueday and month as may) Current Attention Level: Sustained Memory: Decreased short-term memory, Decreased recall of precautions Following Commands: Follows one step commands with increased time, Follows one step commands inconsistently Safety/Judgement: Decreased awareness of safety, Decreased awareness of deficits Awareness: Emergent Problem Solving: Slow processing General Comments: pt very distracted, pt decr attention to L side, pt with poor awareness to L side pocketing  Blood pressure 164/79, pulse 85, temperature 97.7 F (36.5 C), temperature source Oral, resp. rate 18, height 6\' 3"  (1.905 m), weight 89.6 kg (197 lb 8.5 oz), SpO2 99 %. Physical Exam  Constitutional: He appears well-developed.  HENT:  Mild left facial droop  Eyes:  Pupils reactive to light  Neck: Normal range of motion. Neck supple. No thyromegaly present.  Cardiovascular:  Cardiac rate controlled  Respiratory: Effort normal and breath sounds normal. No respiratory distress.  GI: Soft. Bowel sounds are normal. He exhibits no distension.  Neurological: He is alert.  Provides name and age. Follows simple commands. Patient has a right gaze preference. Fair awareness of deficits  Skin: Skin is warm and dry.  Motor 5/5 R delt Bi tri grip HF KE and ankle DF 4/5 Left Delt bi tri grip HF KE and ADF Sensory reduced to LT LUE>LLE, intact on Right side Visual field cut Left temporal field ? Left CN VII with incomplete lid closure and tearing  Lab Results Last 24 Hours    Results for orders placed or performed during the hospital encounter of 02/19/15 (from the past 24 hour(s))   Glucose, capillary Status: Abnormal   Collection Time: 02/21/15 11:52 AM  Result Value Ref Range   Glucose-Capillary 133 (H) 65 - 99 mg/dL   Comment 1 Document in Chart   CBC  Status: Abnormal   Collection Time: 02/22/15 4:00 AM  Result Value Ref Range   WBC 7.7 4.0 - 10.5 K/uL   RBC 4.81 4.22 - 5.81 MIL/uL   Hemoglobin 14.9 13.0 - 17.0 g/dL   HCT 44.9 39.0 - 52.0 %   MCV 93.3 78.0 - 100.0 fL   MCH 31.0 26.0 - 34.0 pg   MCHC 33.2 30.0 - 36.0 g/dL   RDW 14.5 11.5 - 15.5 %   Platelets 147 (L) 150 - 400 K/uL  Basic metabolic panel Status: Abnormal   Collection Time: 02/22/15 4:00 AM  Result Value Ref Range   Sodium 138 135 - 145 mmol/L   Potassium 3.7 3.5 - 5.1 mmol/L   Chloride 103 101 - 111 mmol/L   CO2 25 22 - 32 mmol/L   Glucose, Bld 114 (H) 65 - 99 mg/dL   BUN 12 6 - 20 mg/dL   Creatinine, Ser 0.96 0.61 - 1.24 mg/dL   Calcium 8.8 (L) 8.9 - 10.3 mg/dL   GFR calc non Af Amer >60 >60 mL/min   GFR calc Af Amer >60 >60 mL/min   Anion gap 10 5 - 15      Imaging Results (Last 48 hours)    Mr Brain Wo Contrast  02/20/2015 CLINICAL DATA: Left-sided paralysis. Recent endovascular procedure. EXAM: MRI HEAD WITHOUT CONTRAST MRA HEAD WITHOUT CONTRAST TECHNIQUE: Multiplanar, multiecho pulse sequences of the brain and surrounding structures were obtained without intravenous contrast. Angiographic images of the head were obtained using MRA technique without contrast. COMPARISON: CT head 522. Endovascular procedure 5/22 FINDINGS: MRI HEAD FINDINGS There is an extensive area of acute infarction affecting most of the RIGHT middle cerebral artery territory including the frontal, temporal, and parietal lobes, the insular cortex and subcortical white matter, as well as the basal ganglia including the caudate and lentiform nucleus. There is some sparing of the subcortical  and periventricular white matter on the RIGHT. A tiny incidental region of cortical and subcortical infarction affects the LEFT lobe. Generalized atrophy. Chronic microvascular ischemic change. No petechial hemorrhage or foci of chronic hemorrhage. Flow void patency of the RIGHT ICA and MCA has been re-established. Cavum septum pellucidum and vergae. Early right-to-left midline shift estimated 3 mm. No extra-axial fluid collection. No tonsillar or uncal herniation. Extracranial soft tissues grossly unremarkable. MRA HEAD FINDINGS Both internal carotid arteries are patent without flow-limiting stenosis. Both middle cerebral arteries are patent without flow-limiting stenosis. Minor proximal oriented LEFT A1 ACA, non flow reducing. Minor irregularity of the proximal RIGHT M2 branches. Basilar artery widely patent with vertebrals codominant. Severe stenosis P2 RIGHT PCA. No cerebellar branch occlusion. No intracranial aneurysm or vascular occlusion. IMPRESSION: Extensive area of acute infarction affecting most of the RIGHT middle cerebral artery territory, including the basal ganglia. No hemorrhagic transformation. Tiny incidental area of ischemia LEFT occipital lobe, of no clinical significance. Early right-to-left shift of 3 mm. Patency of the proximal vasculature is been re-established with endovascular treatment. Electronically Signed By: Rolla Flatten M.D. On: 02/20/2015 18:01   Mr Jodene Nam Head/brain Wo Cm  02/20/2015 CLINICAL DATA: Left-sided paralysis. Recent endovascular procedure. EXAM: MRI HEAD WITHOUT CONTRAST MRA HEAD WITHOUT CONTRAST TECHNIQUE: Multiplanar, multiecho pulse sequences of the brain and surrounding structures were obtained without intravenous contrast. Angiographic images of the head were obtained using MRA technique without contrast. COMPARISON: CT head 522. Endovascular procedure 5/22 FINDINGS: MRI HEAD FINDINGS There is an extensive area of acute infarction affecting  most of the RIGHT middle cerebral artery territory including  the frontal, temporal, and parietal lobes, the insular cortex and subcortical white matter, as well as the basal ganglia including the caudate and lentiform nucleus. There is some sparing of the subcortical and periventricular white matter on the RIGHT. A tiny incidental region of cortical and subcortical infarction affects the LEFT lobe. Generalized atrophy. Chronic microvascular ischemic change. No petechial hemorrhage or foci of chronic hemorrhage. Flow void patency of the RIGHT ICA and MCA has been re-established. Cavum septum pellucidum and vergae. Early right-to-left midline shift estimated 3 mm. No extra-axial fluid collection. No tonsillar or uncal herniation. Extracranial soft tissues grossly unremarkable. MRA HEAD FINDINGS Both internal carotid arteries are patent without flow-limiting stenosis. Both middle cerebral arteries are patent without flow-limiting stenosis. Minor proximal oriented LEFT A1 ACA, non flow reducing. Minor irregularity of the proximal RIGHT M2 branches. Basilar artery widely patent with vertebrals codominant. Severe stenosis P2 RIGHT PCA. No cerebellar branch occlusion. No intracranial aneurysm or vascular occlusion. IMPRESSION: Extensive area of acute infarction affecting most of the RIGHT middle cerebral artery territory, including the basal ganglia. No hemorrhagic transformation. Tiny incidental area of ischemia LEFT occipital lobe, of no clinical significance. Early right-to-left shift of 3 mm. Patency of the proximal vasculature is been re-established with endovascular treatment. Electronically Signed By: Rolla Flatten M.D. On: 02/20/2015 18:01     Assessment/Plan: Diagnosis: Right MCA infarct with Left HP, Left neglect and Left HH 1. Does the need for close, 24 hr/day medical supervision in concert with the patient's rehab needs make it unreasonable for this patient to be served in a less intensive  setting? Yes 2. Co-Morbidities requiring supervision/potential complications: Cardiac arrythmia with PPM [placement 5/25 pnd), Chronic systolic CHF, HTN uncontrolled 3. Due to bladder management, bowel management, safety, skin/wound care, disease management, medication administration, pain management and patient education, does the patient require 24 hr/day rehab nursing? Yes 4. Does the patient require coordinated care of a physician, rehab nurse, PT (1-2 hrs/day, 5 days/week), OT (1-2 hrs/day, 5 days/week) and SLP (.5-1 hrs/day, 5 days/week) to address physical and functional deficits in the context of the above medical diagnosis(es)? Yes Addressing deficits in the following areas: balance, endurance, locomotion, strength, transferring, bowel/bladder control, bathing, dressing, feeding, grooming, toileting, cognition, speech and language 5. Can the patient actively participate in an intensive therapy program of at least 3 hrs of therapy per day at least 5 days per week? Yes 6. The potential for patient to make measurable gains while on inpatient rehab is good 7. Anticipated functional outcomes upon discharge from inpatient rehab are supervision with PT, supervision with OT, supervision with SLP. 8. Estimated rehab length of stay to reach the above functional goals is: 10-14d 9. Does the patient have adequate social supports and living environment to accommodate these discharge functional goals? Potentially 10. Anticipated D/C setting: Home 11. Anticipated post D/C treatments: [place] therapy 12. Overall Rehab/Functional Prognosis: good  RECOMMENDATIONS: This patient's condition is appropriate for continued rehabilitative care in the following setting: CIR Patient has agreed to participate in recommended program. Yes Note that insurance prior authorization may be required for reimbursement for recommended care.  Comment: needs PPM placed first with adequate post op recovery    02/22/2015        Revision History     Date/Time User Provider Type Action   02/22/2015 1:05 PM Parker Blake, MD Physician Sign   02/22/2015 10:45 AM Cathlyn Parsons, PA-C Physician Assistant [facility] Details Report       Routing  History     Date/Time From To Method   02/22/2015 1:05 PM Parker Blake, MD Parker Blake, MD In [facility]

## 2015-02-24 NOTE — Progress Notes (Signed)
Rehab admissions - I left a message with the insurance case manager this am requesting response regarding inpatient rehab admission.  I hope to hear back from insurance case manager today.  I do have a bed on inpatient rehab if we get approval today.  Call me for questions.  #[phone]

## 2015-02-24 NOTE — Progress Notes (Signed)
Retta Diones, RN Rehab Admission Coordinator Signed Physical Medicine and Rehabilitation PMR Pre-admission 02/23/2015 11:44 AM  Related encounter: ED to Hosp-Admission (Current) from 02/19/2015 in [place] Collapse All   PMR Admission Coordinator Pre-Admission Assessment  Patient: Parker [name] is an 81 y.o., male MRN: [id number] DOB: [birth year] Height: _0  (190.5 cm) Weight: 89.7 kg (197 lb 12 oz)  Insurance Information HMO: PPO: Yes PCP: IPA: 80/20: OTHER: Group #[id number] PRIMARY: [employer] Medicare Choice Policy#: [id number] Subscriber: Nicoletta Dress CM Name: Silvio Pate Phone#: [phone] Fax#: [phone] Pre-Cert#: [id number] with update due every 7 days Employer: Retired Benefits: Phone #: [phone] Name: Automated Eff. Date: 09/30/12 Deduct: $0 Out of Pocket Max: $4000 ($0 met) Life Max: unlimited CIR: $160 days 1-10 SNF: $0 days 1-20; $50 days 21-100 Outpatient: with medical necessity Co-Pay: $20 copay Home Health: 100% Co-Pay: none DME: 80% Co-Pay: 20% Providers: in network   Emergency Contact Information Contact Information    Name Relation Home Work [place] Daughter [phone]   Ronalee Red Daughter [phone]     Lamberth,[name] Significant other [phone]       Current Medical History  Patient Admitting Diagnosis: R MCA and L occipital infarct  History of Present Illness: An 81 y.o. right handed male with history of CAD with stenting, hypertension, chronic systolic congestive heart failure, atrial fibrillation maintained on aspirin. Independent and active prior to admission. He presented 02/19/2015  with left-sided weakness altered mental status and right gaze preference. 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 Patient did receive TPA. MRI and MRA showed extensive area of acute infarct right middle cerebral artery territory as well as left occipital small infarct. Echocardiogram with ejection fraction of 10% grade 2 diastolic dysfunction. Findings of occluded right MCA M1 segment underwent complete revascularization for interventional radiology. Patient did require ventilatory support for a short time. EKG with atrial fibrillation with complete heart block as well as bouts of bradycardia. Cardiology consulted. Currently maintained on aspirin 325 mg daily for CVA prophylaxis as well as subcutaneous Lovenox for DVT prophylaxis. A pacemaker was placed in light of heart block and bradycardia 02/22/2015 per Dr. Lovena Le. Tolerating a regular consistency diet. Physical therapy evaluation completed 02/21/2015 with recommendations of physical medicine rehabilitation consult    Total: 3=NIH  Past Medical History  Past Medical History  Diagnosis Date  . MI (myocardial infarction)   . Hypertension   . Hyperlipidemia   . Bradycardia     per girlfriend, has refused pacemaker   . Chronic systolic heart failure     35%-45%, 2013  . Head and neck cancer     surgical resection, no XRT  . Atrial fibrillation     Family History  family history includes Cancer in his father and paternal grandfather; Heart attack (age of onset: 76) in his mother.  Prior Rehab/Hospitalizations:  Has the patient had major surgery during 100 days prior to admission? No  Current Medications   Current facility-administered medications:  . stroke: mapping our early stages of recovery book, , Does not apply, Once, Amie Portland, MD . acetaminophen (TYLENOL) tablet 325-650 mg, 325-650 mg, Oral, Q4H PRN, Evans Lance, MD, 650 mg at  02/23/15 1110 . antiseptic oral rinse (CPC / CETYLPYRIDINIUM CHLORIDE 0.05%) solution 7 mL, 7 mL, Mouth Rinse, QID, Garvin Fila, MD, 7 mL at 02/22/15 1600 . aspirin EC tablet 325 mg, 325  mg, Oral, Daily, 325 mg at 02/24/15 0917 **OR** [DISCONTINUED] aspirin suppository 300 mg, 300 mg, Rectal, Daily, Greta Doom, MD, 300 mg at 02/21/15 1012 . atorvastatin (LIPITOR) tablet 40 mg, 40 mg, Oral, q1800, Luella Cook, MD, 40 mg at 02/24/15 2778 . carvedilol (COREG) tablet 3.125 mg, 3.125 mg, Oral, BID WC, Patsey Berthold, NP, 3.125 mg at 02/24/15 0918 . chlorhexidine (PERIDEX) 0.12 % solution 15 mL, 15 mL, Mouth Rinse, BID, Garvin Fila, MD, 15 mL at 02/24/15 0930 . hydrALAZINE (APRESOLINE) injection 10 mg, 10 mg, Intravenous, Q4H PRN, Rosalin Hawking, MD . lisinopril (PRINIVIL,ZESTRIL) tablet 20 mg, 20 mg, Oral, QHS, Rosalin Hawking, MD, 20 mg at 02/24/15 0917 . ondansetron [facility]) injection 4 mg, 4 mg, Intravenous, Q6H PRN, Evans Lance, MD, 4 mg at 02/22/15 2034 . pantoprazole (PROTONIX) EC tablet 40 mg, 40 mg, Oral, Daily, Rosalin Hawking, MD, 40 mg at 02/24/15 0918 . senna-docusate (Senokot-S) tablet 1 tablet, 1 tablet, Oral, QHS PRN, Amie Portland, MD . tamsulosin (FLOMAX) capsule 0.4 mg, 0.4 mg, Oral, Daily, Delfina Redwood, MD, 0.4 mg at 02/24/15 2423  Patients Current Diet: DIET SOFT Room service appropriate?: Yes; Fluid consistency:: Thin  Precautions / Restrictions Precautions Precautions: Fall Restrictions Weight Bearing Restrictions: No   Has the patient had 2 or more falls or a fall with injury in the past year?No  Prior Activity Level Community (5-7x/wk): Went out daily. Droves from girlfriend's condo to his home daily to feed his dog, Jeanmarie Plant and get his mail. It is a 10-15 minute drive. Jeanmarie Plant is a Economist.  Home Assistive Devices / Equipment Home Assistive Devices/Equipment: Dentures (specify type) Home Equipment: None  Prior Device Use:  Indicate devices/aids used by the patient prior to current illness, exacerbation or injury? None of the above. Did not use a device, but walked slowly.  Prior Functional Level Prior Function Level of Independence: Independent Comments: Independent, drove, ran errands, etc.  Self Care: Did the patient need help bathing, dressing, using the toilet or eating? Independent  Indoor Mobility: Did the patient need assistance with walking from room to room (with or without device)? Independent  Stairs: Did the patient need assistance with internal or external stairs (with or without device)? Independent  Functional Cognition: Did the patient need help planning regular tasks such as shopping or remembering to take medications? Independent. Girlfriend reports that patient is developing mild dementia, but she has not had to assist patient at home PTA.  Current Functional Level Cognition  Arousal/Alertness: Awake/alert Overall Cognitive Status: Impaired/Different from baseline Current Attention Level: Focused Orientation Level: Oriented X4 Following Commands: Follows one step commands inconsistently, Follows one step commands with increased time Safety/Judgement: Decreased awareness of safety, Decreased awareness of deficits General Comments: Pt continues to have difficulty with visual assessment. pt with brief attention to task and then easy distracted with other task. Pt reaching out to hold therapist hands when attempting to assess saccades. Pt unaware of pacemaker surg. Pt unaware of reason for sling. Pt reports events that occurred yesterday as physical therapy and that he had a stroke is the reason he has a single. Pt reports stroke as a "mini stroke. Not too bad" Attention: Sustained Sustained Attention: Impaired Sustained Attention Impairment: Verbal basic, Functional basic Memory: Impaired Memory Impairment: Storage deficit, Retrieval deficit, Decreased short term memory Decreased  Short Term Memory: Verbal basic Awareness: Impaired Awareness Impairment: Intellectual impairment Problem Solving: Impaired Problem Solving Impairment: Verbal basic Behaviors: Impulsive, Perseveration Safety/Judgment:  Impaired   Extremity Assessment (includes Sensation/Coordination)  Upper Extremity Assessment: LUE deficits/detail LUE Deficits / Details: decr attention, 4 out 5 MMT, drift L UE LUE Sensation: decreased proprioception  Lower Extremity Assessment: Defer to PT evaluation RLE Deficits / Details: mild weakness may be expained by distractability LLE Deficits / Details: mildly weaker and less coordinated than R, but still difficult to MMT due to decreased ability to follow direction.    ADLs  Overall ADL's : Needs assistance/impaired Eating/Feeding: Minimal assistance Eating/Feeding Details (indicate cue type and reason): cues for L side mouth clearing. noted to have secretions out of L side of mouth adn decr awareness Grooming: Wash/dry face, Min guard, Sitting Lower Body Bathing: Moderate assistance, Sit to/from stand Upper Body Dressing Details (indicate cue type and reason): total (A) to adjust shoulder sling Toilet Transfer: Moderate assistance, Regular Toilet Toilet Transfer Details (indicate cue type and reason): cues for hand placement Functional mobility during ADLs: Moderate assistance, Rolling walker General ADL Comments: Pt required mod cueing for bed mobility and cues to prevent L UE use outside of protocol s/p pacemaker. Pt with L inattention. pt reports tv program is "his program" but reports wrong name to program. Pt calls it "lets make a deal " and with further cueing to visually attend reports "oh prices right"     Mobility  Overal bed mobility: Needs Assistance Bed Mobility: Supine to Sit Supine to sit: Mod assist Sit to supine: Min assist (max directional cues on how to scoot up in bed) General bed mobility comments: max cueing to sequence task  and to progress toward eob. pt with delayed processing for task    Transfers  Overall transfer level: Needs assistance Equipment used: 1 person hand held assist Transfers: Sit to/from Stand Sit to Stand: Mod assist General transfer comment: cues for hand placement    Ambulation / Gait / Stairs / Wheelchair Mobility  Ambulation/Gait Ambulation/Gait assistance: Min assist Ambulation Distance (Feet): 20 Feet (to/from bathroom) Assistive device: 1 person hand held assist General Gait Details: very short, shuffling steps and tending to reach out fro UE support with amb in tight quarters to bathroom; tending to stagger Gait Pattern/deviations: Shuffle, Trunk flexed Gait velocity: slower    Posture / Balance Balance Overall balance assessment: Needs assistance Sitting-balance support: Bilateral upper extremity supported, Feet supported Sitting balance-Leahy Scale: Fair Standing balance support: Single extremity supported Standing balance-Leahy Scale: Fair Standing balance comment: static standing with initial posterior lean Standardized Balance Assessment Standardized Balance Assessment : Berg Balance Test Berg Balance Test Sit to Stand: Able to stand using hands after several tries Standing Unsupported: Able to stand 2 minutes with supervision Sitting with Back Unsupported but Feet Supported on Floor or Stool: Able to sit safely and securely 2 minutes Stand to Sit: Controls descent by using hands Transfers: Able to transfer safely, definite need of hands Standing Unsupported with Eyes Closed: Able to stand 3 seconds Standing Ubsupported with Feet Together: Needs help to attain position but able to stand for 30 seconds with feet together From Standing, Reach Forward with Outstretched Arm: Can reach forward >12 cm safely (5") From Standing Position, Pick up Object from Floor: Unable to try/needs assist to keep balance From Standing Position, Turn to Look Behind Over each  Shoulder: Turn sideways only but maintains balance Turn 360 Degrees: Needs close supervision or verbal cueing Standing Unsupported, Alternately Place Feet on Step/Stool: Needs assistance to keep from falling or unable to try Standing Unsupported, One Foot in Front: Needs help  to step but can hold 15 seconds Standing on One Leg: Tries to lift leg/unable to hold 3 seconds but remains standing independently Total Score: 26    Special needs/care consideration BiPAP/CPAP No CPM No Continuous Drip IV 0.9% NS 50 ml/hr Dialysis No  Life Vest No Oxygen None at home Special Bed No Trach Size No Wound Vac (area) No  Skin No  Bowel mgmt: Last documented BM 02/21/15 Bladder mgmt: urinary catheter in place Diabetic mgmt Borderline DM, not on medications at home.    Previous Home Environment Living Arrangements: Spouse/significant other Lives With: Significant other Available Help at Discharge: Family Type of Home: Other(Comment) Home Layout: One level Home Access: Building control surveyor Shower/Tub: Multimedia programmer: [place]: No  Discharge Living Setting Plans for Discharge Living Setting: Lives with (comment), Apartment (Lives with girlfriend in [place] ) Type of Home at Discharge: Apartment (Third floor condo with an elevator in the building.) Discharge Home Layout: One level (On the third floor, elevator in building.) Discharge Home Access: Level entry, Elevator (Small step down from sidewalk to pavement for car.) Does the patient have any problems obtaining your medications?: No  Social/Family/Support Systems Patient Roles: Parent, Other (Comment) (Has a girlfriend and 2 daughters.) Contact Information: Purnell Shoemaker - daughter (c) [phone] Anticipated Caregiver: Sharia Reeve - girlfriend Anticipated Caregiver's Contact Information: [name] - girlfriend (h) [phone] Ability/Limitations of Caregiver:  Girlfriend takes care of patient, gets him meals, and patient stays with his girlfriend. Caregiver Availability: 24/7 Discharge Plan Discussed with Primary Caregiver: Yes Is Caregiver In Agreement with Plan?: Yes Does Caregiver/Family have Issues with Lodging/Transportation while Pt is in Rehab?: No  Goals/Additional Needs Patient/Family Goal for Rehab: PT/OT/ST supervision goals Expected length of stay: 10-14 days Cultural Considerations: [employer]. Enjoys watching Dr. Karsten Ro on TV. Dietary Needs: Heart, thin liquids diet Equipment Needs: TBD Pt/Family Agrees to Admission and willing to participate: Yes Program Orientation Provided & Reviewed with Pt/Caregiver Including Roles & Responsibilities: Yes  Decrease burden of Care through IP rehab admission: N/A  Possible need for SNF placement upon discharge: Not planned  Patient Condition: This patient's medical and functional status has changed since the consult dated: 02/22/15 in which the Rehabilitation Physician determined and documented that the patient's condition is appropriate for intensive rehabilitative care in an inpatient rehabilitation facility. See "History of Present Illness" (above) for medical update. Functional changes are: Currently requiring mod assist for transfers and min assist to ambulate 20 ft +1 HHA. Patient's medical and functional status update has been discussed with the Rehabilitation physician and patient remains appropriate for inpatient rehabilitation. Will admit to inpatient rehab today.  Preadmission Screen Completed By: Retta Diones, 02/24/2015 10:42 AM ______________________________________________________________________  Discussed status with Dr. Letta Pate on 02/24/15 at 1040 and received telephone approval for admission today.  Admission Coordinator: Retta Diones, time1040/Date05/27/16          Cosigned by: [name], MD at 02/24/2015 10:48 AM  Revision History      Date/Time User Provider Type Action   02/24/2015 10:48 AM [name], MD Physician Cosign   02/24/2015 10:42 AM Retta Diones, RN Rehab Admission Coordinator Sign

## 2015-02-24 NOTE — Interval H&P Note (Signed)
Parker [name] was admitted today to Inpatient Rehabilitation with the diagnosis of Right MCA embolic infarct.  The patient's history has been reviewed, patient examined, and there is no change in status.  Patient continues to be appropriate for intensive inpatient rehabilitation.  I have reviewed the patient's chart and labs.  Questions were answered to the patient's satisfaction. The PAPE has been reviewed and assessment remains appropriate.  Parker [name] 02/24/2015, 4:31 PM

## 2015-02-24 NOTE — H&P (Signed)
Physical Medicine and Rehabilitation Admission H&P   Chief Complaint  Patient presents with  . Code Stroke  : HPI: Parker [name] is a 81 y.o. right handed male with history of CAD with stenting, hypertension, chronic systolic congestive heart failure, atrial fibrillation maintained on aspirin. Independent and active prior to admission. He presented 02/19/2015 with left-sided weakness altered mental status and right gaze preference. 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 Patient did receive TPA. MRI and MRA showed extensive area of acute infarct right middle cerebral artery territory as well as left occipital small infarct. Echocardiogram with ejection fraction of 10% grade 2 diastolic dysfunction. Findings of occluded right MCA M1 segment underwent complete revascularization for interventional radiology. Patient did require ventilatory support for a short time. EKG with atrial fibrillation with complete heart block as well as bouts of bradycardia. Cardiology consulted Currently maintained on aspirin 325 mg daily for CVA prophylaxis as well as subcutaneous Lovenox for DVT prophylaxis. A pacemaker was placed in light of heart block and bradycardia 02/22/2015 per Dr. Lovena Le. Tolerating a regular consistency diet. Physical therapy evaluation completed 02/21/2015 with recommendations of physical medicine rehabilitation consult   Patient does not remember me from yesterday  ROS Review of Systems  Cardiovascular: Positive for palpitations.  Gastrointestinal: Positive for constipation.  Musculoskeletal: Positive for back pain.  Neurological: Positive for weakness.  All other systems reviewed and are negative   Past Medical History  Diagnosis Date  . MI (myocardial infarction)   . Hypertension   . Hyperlipidemia   . Bradycardia     per girlfriend, has refused pacemaker   . Chronic systolic heart failure      35%-45%, 2013  . Head and neck cancer     surgical resection, no XRT  . Atrial fibrillation    Past Surgical History  Procedure Laterality Date  . Back surgery    . Radiology with anesthesia N/A 02/19/2015    Procedure: RADIOLOGY WITH ANESTHESIA; Surgeon: Luanne Bras, MD; Location: [facility]; Service: Radiology; Laterality: N/A;   Family History  Problem Relation Age of Onset  . Heart attack Mother 38  . Cancer Father   . Cancer Paternal Grandfather    Social History:  reports that he has quit smoking. He does not have any smokeless tobacco history on file. He reports that he does not drink alcohol or use illicit drugs. Allergies: No Known Allergies Medications Prior to Admission  Medication Sig Dispense Refill  . aspirin EC 81 MG tablet Take 81 mg by mouth daily.    Marland Kitchen lisinopril (PRINIVIL,ZESTRIL) 20 MG tablet Take 20 mg by mouth at bedtime.     Marland Kitchen omeprazole (PRILOSEC) 20 MG capsule Take 20 mg by mouth daily.    . simvastatin (ZOCOR) 20 MG tablet Take 20 mg by mouth at bedtime.      Home: Home Living Family/patient expects to be discharged to:: Private residence Living Arrangements: Spouse/significant other Available Help at Discharge: Family Type of Home: Other(Comment) Home Access: Elevator Home Layout: One level Home Equipment: None Lives With: Significant other  Functional History: Prior Function Level of Independence: Independent Comments: Independent, drove, ran errands, etc.  Functional Status:  Mobility: Bed Mobility Overal bed mobility: Needs Assistance Bed Mobility: Supine to Sit, Sit to Supine Supine to sit: Mod assist Sit to supine: Min assist (max directional cues on how to scoot up in bed) General bed mobility comments: pt reaching for PT to assist him, assist for trunk elevation Transfers  Overall transfer level: Needs assistance Equipment used: Rolling walker (2  wheeled) Transfers: Sit to/from Stand Sit to Stand: Min assist General transfer comment: cue for safety wtih RW Ambulation/Gait Ambulation/Gait assistance: Min assist Ambulation Distance (Feet): 150 Feet Assistive device: Rolling walker (2 wheeled) General Gait Details: attempted to amb without AD however pt extremely usnteady with short shuffled steps and staggering. once give RW pt with icnreased stabiltiy however required max v/c's for safe manipulation of walker especially during turns Gait Pattern/deviations: Step-through pattern, Staggering right, Staggering left, Narrow base of support Gait velocity: slower    ADL: ADL Overall ADL's : Needs assistance/impaired Eating/Feeding: Minimal assistance, Cueing for safety, Sitting Eating/Feeding Details (indicate cue type and reason): pt reports "feels lumpy". Pt requesting additional options. Pt provided cereal. Pt eating cereal dry initally and OT cued to add milk. Pt taking large bites Grooming: Wash/dry hands, Minimal assistance, Sitting Lower Body Bathing: Moderate assistance, Sit to/from stand Toilet Transfer: Moderate assistance, Ambulation, RW, BSC Toilet Transfer Details (indicate cue type and reason): cues for safety. pt requesting toilet transfer with foley in place. Pt not voiding bowels  Functional mobility during ADLs: Moderate assistance, Rolling walker  Cognition: Cognition Overall Cognitive Status: Impaired/Different from baseline Arousal/Alertness: Awake/alert Orientation Level: Oriented X4 Attention: Sustained Sustained Attention: Impaired Sustained Attention Impairment: Verbal basic, Functional basic Memory: Impaired Memory Impairment: Storage deficit, Retrieval deficit, Decreased short term memory Decreased Short Term Memory: Verbal basic Awareness: Impaired Awareness Impairment: Intellectual impairment Problem Solving: Impaired Problem Solving Impairment: Verbal basic Behaviors: Impulsive,  Perseveration Safety/Judgment: Impaired Cognition Arousal/Alertness: Awake/alert Behavior During Therapy: Impulsive Overall Cognitive Status: Impaired/Different from baseline Area of Impairment: Safety/judgement, Attention, Orientation, Memory, Following commands, Problem solving, Awareness Orientation Level: (was aware he was at [facility] and it was May 2016) Current Attention Level: Sustained Memory: Decreased short-term memory, Decreased recall of precautions Following Commands: Follows one step commands with increased time Safety/Judgement: Decreased awareness of safety, Decreased awareness of deficits Awareness: Emergent Problem Solving: Slow processing General Comments: pt with poor carryover however can repeat instructions ie. required repeated v/c's to stay in walker. pt unable to stay in walker but kept repeating "stay in walker"  Physical Exam: Blood pressure 164/79, pulse 85, temperature 97.7 F (36.5 C), temperature source Oral, resp. rate 18, height 6' 3"  (1.905 m), weight 89.6 kg (197 lb 8.5 oz), SpO2 99 %. Physical Exam Constitutional: He appears well-developed.  HENT:  Mild left facial droop  Eyes:  Pupils reactive to light  Neck: Normal range of motion. Neck supple. No thyromegaly present.  Cardiovascular:  Cardiac rate controlled  Respiratory: Effort normal and breath sounds normal. No respiratory distress.  GI: Soft. Bowel sounds are normal. He exhibits no distension.  Neurological: He is alert.  Provides name and age. Follows simple commands. Patient has a right gaze preference. Fair awareness of deficits  Skin: Skin is warm and dry.  Motor 5/5 R delt Bi tri grip HF KE and ankle DF 4/5 Left Delt bi tri grip HF KE and ADF Sensory reduced to LT LUE>LLE, intact on Right side Visual field cut Left temporal field Left neglect    Lab Results Last 48 Hours    Results for orders placed or performed during the hospital encounter of 02/19/15 (from the past 48  hour(s))  Troponin I Status: Abnormal   Collection Time: 02/20/15 3:24 PM  Result Value Ref Range   Troponin I 0.06 (H) <0.031 ng/mL    Comment:   PERSISTENTLY INCREASED TROPONIN VALUES IN THE RANGE OF  0.04-0.49 ng/mL CAN BE SEEN IN:  -UNSTABLE ANGINA  -CONGESTIVE HEART FAILURE  -MYOCARDITIS  -CHEST TRAUMA  -ARRYHTHMIAS  -LATE PRESENTING MYOCARDIAL INFARCTION  -COPD  CLINICAL FOLLOW-UP RECOMMENDED.   Troponin I Status: Abnormal   Collection Time: 02/20/15 9:00 PM  Result Value Ref Range   Troponin I 0.07 (H) <0.031 ng/mL    Comment:   PERSISTENTLY INCREASED TROPONIN VALUES IN THE RANGE OF 0.04-0.49 ng/mL CAN BE SEEN IN:  -UNSTABLE ANGINA  -CONGESTIVE HEART FAILURE  -MYOCARDITIS  -CHEST TRAUMA  -ARRYHTHMIAS  -LATE PRESENTING MYOCARDIAL INFARCTION  -COPD  CLINICAL FOLLOW-UP RECOMMENDED.   Glucose, capillary Status: None   Collection Time: 02/20/15 11:56 PM  Result Value Ref Range   Glucose-Capillary 98 65 - 99 mg/dL   Comment 1 Notify RN   Glucose, capillary Status: Abnormal   Collection Time: 02/21/15 11:52 AM  Result Value Ref Range   Glucose-Capillary 133 (H) 65 - 99 mg/dL   Comment 1 Document in Chart   CBC Status: Abnormal   Collection Time: 02/22/15 4:00 AM  Result Value Ref Range   WBC 7.7 4.0 - 10.5 K/uL   RBC 4.81 4.22 - 5.81 MIL/uL   Hemoglobin 14.9 13.0 - 17.0 g/dL   HCT 44.9 39.0 - 52.0 %   MCV 93.3 78.0 - 100.0 fL   MCH 31.0 26.0 - 34.0 pg   MCHC 33.2 30.0 - 36.0 g/dL   RDW 14.5 11.5 - 15.5 %   Platelets 147 (L) 150 - 400 K/uL  Basic metabolic panel Status: Abnormal   Collection Time: 02/22/15 4:00 AM  Result Value Ref Range   Sodium 138 135 - 145 mmol/L   Potassium 3.7 3.5 - 5.1 mmol/L   Chloride 103 101 - 111 mmol/L    CO2 25 22 - 32 mmol/L   Glucose, Bld 114 (H) 65 - 99 mg/dL   BUN 12 6 - 20 mg/dL   Creatinine, Ser 0.96 0.61 - 1.24 mg/dL   Calcium 8.8 (L) 8.9 - 10.3 mg/dL   GFR calc non Af Amer >60 >60 mL/min   GFR calc Af Amer >60 >60 mL/min    Comment: (NOTE) The eGFR has been calculated using the CKD EPI equation. This calculation has not been validated in all clinical situations. eGFR's persistently <60 mL/min signify possible Chronic Kidney Disease.    Anion gap 10 5 - 15      Imaging Results (Last 48 hours)    Mr Brain Wo Contrast  02/20/2015 CLINICAL DATA: Left-sided paralysis. Recent endovascular procedure. EXAM: MRI HEAD WITHOUT CONTRAST MRA HEAD WITHOUT CONTRAST TECHNIQUE: Multiplanar, multiecho pulse sequences of the brain and surrounding structures were obtained without intravenous contrast. Angiographic images of the head were obtained using MRA technique without contrast. COMPARISON: CT head 522. Endovascular procedure 5/22 FINDINGS: MRI HEAD FINDINGS There is an extensive area of acute infarction affecting most of the RIGHT middle cerebral artery territory including the frontal, temporal, and parietal lobes, the insular cortex and subcortical white matter, as well as the basal ganglia including the caudate and lentiform nucleus. There is some sparing of the subcortical and periventricular white matter on the RIGHT. A tiny incidental region of cortical and subcortical infarction affects the LEFT lobe. Generalized atrophy. Chronic microvascular ischemic change. No petechial hemorrhage or foci of chronic hemorrhage. Flow void patency of the RIGHT ICA and MCA has been re-established. Cavum septum pellucidum and vergae. Early right-to-left midline shift estimated 3 mm. No extra-axial fluid collection. No tonsillar or uncal herniation. Extracranial soft  tissues grossly unremarkable. MRA HEAD FINDINGS Both internal carotid arteries are patent without  flow-limiting stenosis. Both middle cerebral arteries are patent without flow-limiting stenosis. Minor proximal oriented LEFT A1 ACA, non flow reducing. Minor irregularity of the proximal RIGHT M2 branches. Basilar artery widely patent with vertebrals codominant. Severe stenosis P2 RIGHT PCA. No cerebellar branch occlusion. No intracranial aneurysm or vascular occlusion. IMPRESSION: Extensive area of acute infarction affecting most of the RIGHT middle cerebral artery territory, including the basal ganglia. No hemorrhagic transformation. Tiny incidental area of ischemia LEFT occipital lobe, of no clinical significance. Early right-to-left shift of 3 mm. Patency of the proximal vasculature is been re-established with endovascular treatment. Electronically Signed By: Rolla Flatten M.D. On: 02/20/2015 18:01   Mr Jodene Nam Head/brain Wo Cm  02/20/2015 CLINICAL DATA: Left-sided paralysis. Recent endovascular procedure. EXAM: MRI HEAD WITHOUT CONTRAST MRA HEAD WITHOUT CONTRAST TECHNIQUE: Multiplanar, multiecho pulse sequences of the brain and surrounding structures were obtained without intravenous contrast. Angiographic images of the head were obtained using MRA technique without contrast. COMPARISON: CT head 522. Endovascular procedure 5/22 FINDINGS: MRI HEAD FINDINGS There is an extensive area of acute infarction affecting most of the RIGHT middle cerebral artery territory including the frontal, temporal, and parietal lobes, the insular cortex and subcortical white matter, as well as the basal ganglia including the caudate and lentiform nucleus. There is some sparing of the subcortical and periventricular white matter on the RIGHT. A tiny incidental region of cortical and subcortical infarction affects the LEFT lobe. Generalized atrophy. Chronic microvascular ischemic change. No petechial hemorrhage or foci of chronic hemorrhage. Flow void patency of the RIGHT ICA and MCA has been re-established. Cavum  septum pellucidum and vergae. Early right-to-left midline shift estimated 3 mm. No extra-axial fluid collection. No tonsillar or uncal herniation. Extracranial soft tissues grossly unremarkable. MRA HEAD FINDINGS Both internal carotid arteries are patent without flow-limiting stenosis. Both middle cerebral arteries are patent without flow-limiting stenosis. Minor proximal oriented LEFT A1 ACA, non flow reducing. Minor irregularity of the proximal RIGHT M2 branches. Basilar artery widely patent with vertebrals codominant. Severe stenosis P2 RIGHT PCA. No cerebellar branch occlusion. No intracranial aneurysm or vascular occlusion. IMPRESSION: Extensive area of acute infarction affecting most of the RIGHT middle cerebral artery territory, including the basal ganglia. No hemorrhagic transformation. Tiny incidental area of ischemia LEFT occipital lobe, of no clinical significance. Early right-to-left shift of 3 mm. Patency of the proximal vasculature is been re-established with endovascular treatment. Electronically Signed By: Rolla Flatten M.D. On: 02/20/2015 18:01        Medical Problem List and Plan: 1. Functional deficits secondary to right MCA infarct as well as left occipital small infarct embolic secondary to atrial fibrillation 2. DVT Prophylaxis/Anticoagulation: SCDs. Monitor for any signs of DVT 3. Pain Management: Tylenol as needed 4. Complete heart block/atrial fibrillation/bradycardia. CRT pacemaker 02/22/2015 per Dr. Lovena Le. Cardiac rate controlled 5. Neuropsych: This patient is capable of making decisions on his own behalf. 6. Skin/Wound Care: Routine skin checks 7. Fluids/Electrolytes/Nutrition: Routine I&O with follow-up chemistries 8. Hypertension. Coreg 3.125 mg twice a day, lisinopril 20 mg daily. Monitor with increased mobility 9. Chronic systolic congestive heart failure. Monitor for any signs of fluid overload 10. Hyperlipidemia. Lipitor 11. BPH. Continue Flomax. Check  PVR 3  Post Admission Physician Evaluation: Functional deficits secondary to  right MCA infarct as well as left occipital small infarct embolic secondary to atrial fibrillation 1. . 2. Patient is admitted to receive collaborative, interdisciplinary care between the physiatrist, rehab nursing  staff, and therapy team. 3. Patient's level of medical complexity and substantial therapy needs in context of that medical necessity cannot be provided at a lesser intensity of care such as a SNF. 4. Patient has experienced substantial functional loss from his/her baseline which was documented above under the "Functional History" and "Functional Status" headings. Judging by the patient's diagnosis, physical exam, and functional history, the patient has potential for functional progress which will result in measurable gains while on inpatient rehab. These gains will be of substantial and practical use upon discharge in facilitating mobility and self-care at the household level. 5. Physiatrist will provide 24 hour management of medical needs as well as oversight of the therapy plan/treatment and provide guidance as appropriate regarding the interaction of the two. 6. 24 hour rehab nursing will assist with bladder management, bowel management, safety, skin/wound care, disease management, medication administration and patient education and help integrate therapy concepts, techniques,education, etc. 7. PT will assess and treat for/with: pre gait, gait training, endurance , safety, equipment, neuromuscular re education. Goals are: Sup. 8. OT will assess and treat for/with: ADLs, Cognitive perceptual skills, Neuromuscular re education, safety, endurance, equipment. Goals are: Sup. Therapy may not proceed with showering this patient. 9. SLP will assess and treat for/with: Memory, attention, problem solving, medication management,, left neglect. Goals are: Sup med management. 10. Case Management and Social Worker  will assess and treat for psychological issues and discharge planning. 11. Team conference will be held weekly to assess progress toward goals and to determine barriers to discharge. 12. Patient will receive at least 3 hours of therapy per day at least 5 days per week. 13. ELOS: 1-14d  14. Prognosis: good     [name] M.D. [facility] FAAPM&R (Sports Med, Neuromuscular Med) Diplomate Am Board of Electrodiagnostic Med 02/24/15

## 2015-02-24 NOTE — Care Management Note (Signed)
Case Management Note  Patient Details  Name: Parker [name] MRN: [id number] Date of Birth: Apr 10, 1927  Subjective/Objective:                 CVA   Action/Plan:   Expected Discharge Date:                  Expected Discharge Plan:  [place]  In-House Referral:  Clinical Social Work  Discharge planning Services  CM Consult   Status of Service:  Completed, signed off  Medicare Important Message Given:  Yes Date Medicare IM Given:  02/24/15 Medicare IM give by:  Jonnie Finner RN CCM  Date Additional Medicare IM Given:    Additional Medicare Important Message give by:     If discussed at [place] of Stay Meetings, dates discussed:    Additional Comments:  Erenest Rasher, RN 02/24/2015, 1:57 PM

## 2015-02-24 NOTE — H&P (View-Only) (Signed)
Physical Medicine and Rehabilitation Admission H&P   Chief Complaint  Patient presents with  . Code Stroke  : HPI: Parker [name] is a 81 y.o. right handed male with history of CAD with stenting, hypertension, chronic systolic congestive heart failure, atrial fibrillation maintained on aspirin. Independent and active prior to admission. He presented 02/19/2015 with left-sided weakness altered mental status and right gaze preference. 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 Patient did receive TPA. MRI and MRA showed extensive area of acute infarct right middle cerebral artery territory as well as left occipital small infarct. Echocardiogram with ejection fraction of 74% grade 2 diastolic dysfunction. Findings of occluded right MCA M1 segment underwent complete revascularization for interventional radiology. Patient did require ventilatory support for a short time. EKG with atrial fibrillation with complete heart block as well as bouts of bradycardia. Cardiology consulted Currently maintained on aspirin 325 mg daily for CVA prophylaxis as well as subcutaneous Lovenox for DVT prophylaxis. A pacemaker was placed in light of heart block and bradycardia 02/22/2015 per Dr. Lovena Le. Tolerating a regular consistency diet. Physical therapy evaluation completed 02/21/2015 with recommendations of physical medicine rehabilitation consult   Patient does not remember me from yesterday  ROS Review of Systems  Cardiovascular: Positive for palpitations.  Gastrointestinal: Positive for constipation.  Musculoskeletal: Positive for back pain.  Neurological: Positive for weakness.  All other systems reviewed and are negative   Past Medical History  Diagnosis Date  . MI (myocardial infarction)   . Hypertension   . Hyperlipidemia   . Bradycardia     per girlfriend, has refused pacemaker   . Chronic systolic heart failure      35%-45%, 2013  . Head and neck cancer     surgical resection, no XRT  . Atrial fibrillation    Past Surgical History  Procedure Laterality Date  . Back surgery    . Radiology with anesthesia N/A 02/19/2015    Procedure: RADIOLOGY WITH ANESTHESIA; Surgeon: Luanne Bras, MD; Location: [facility]; Service: Radiology; Laterality: N/A;   Family History  Problem Relation Age of Onset  . Heart attack Mother 56  . Cancer Father   . Cancer Paternal Grandfather    Social History:  reports that he has quit smoking. He does not have any smokeless tobacco history on file. He reports that he does not drink alcohol or use illicit drugs. Allergies: No Known Allergies Medications Prior to Admission  Medication Sig Dispense Refill  . aspirin EC 81 MG tablet Take 81 mg by mouth daily.    Marland Kitchen lisinopril (PRINIVIL,ZESTRIL) 20 MG tablet Take 20 mg by mouth at bedtime.     Marland Kitchen omeprazole (PRILOSEC) 20 MG capsule Take 20 mg by mouth daily.    . simvastatin (ZOCOR) 20 MG tablet Take 20 mg by mouth at bedtime.      Home: Home Living Family/patient expects to be discharged to:: Private residence Living Arrangements: Spouse/significant other Available Help at Discharge: Family Type of Home: Other(Comment) Home Access: Elevator Home Layout: One level Home Equipment: None Lives With: Significant other  Functional History: Prior Function Level of Independence: Independent Comments: Independent, drove, ran errands, etc.  Functional Status:  Mobility: Bed Mobility Overal bed mobility: Needs Assistance Bed Mobility: Supine to Sit, Sit to Supine Supine to sit: Mod assist Sit to supine: Min assist (max directional cues on how to scoot up in bed) General bed mobility comments: pt reaching for PT to assist him, assist for trunk elevation Transfers  Overall transfer level: Needs assistance Equipment used: Rolling walker (2  wheeled) Transfers: Sit to/from Stand Sit to Stand: Min assist General transfer comment: cue for safety wtih RW Ambulation/Gait Ambulation/Gait assistance: Min assist Ambulation Distance (Feet): 150 Feet Assistive device: Rolling walker (2 wheeled) General Gait Details: attempted to amb without AD however pt extremely usnteady with short shuffled steps and staggering. once give RW pt with icnreased stabiltiy however required max v/c's for safe manipulation of walker especially during turns Gait Pattern/deviations: Step-through pattern, Staggering right, Staggering left, Narrow base of support Gait velocity: slower    ADL: ADL Overall ADL's : Needs assistance/impaired Eating/Feeding: Minimal assistance, Cueing for safety, Sitting Eating/Feeding Details (indicate cue type and reason): pt reports "feels lumpy". Pt requesting additional options. Pt provided cereal. Pt eating cereal dry initally and OT cued to add milk. Pt taking large bites Grooming: Wash/dry hands, Minimal assistance, Sitting Lower Body Bathing: Moderate assistance, Sit to/from stand Toilet Transfer: Moderate assistance, Ambulation, RW, BSC Toilet Transfer Details (indicate cue type and reason): cues for safety. pt requesting toilet transfer with foley in place. Pt not voiding bowels  Functional mobility during ADLs: Moderate assistance, Rolling walker  Cognition: Cognition Overall Cognitive Status: Impaired/Different from baseline Arousal/Alertness: Awake/alert Orientation Level: Oriented X4 Attention: Sustained Sustained Attention: Impaired Sustained Attention Impairment: Verbal basic, Functional basic Memory: Impaired Memory Impairment: Storage deficit, Retrieval deficit, Decreased short term memory Decreased Short Term Memory: Verbal basic Awareness: Impaired Awareness Impairment: Intellectual impairment Problem Solving: Impaired Problem Solving Impairment: Verbal basic Behaviors: Impulsive,  Perseveration Safety/Judgment: Impaired Cognition Arousal/Alertness: Awake/alert Behavior During Therapy: Impulsive Overall Cognitive Status: Impaired/Different from baseline Area of Impairment: Safety/judgement, Attention, Orientation, Memory, Following commands, Problem solving, Awareness Orientation Level: (was aware he was at [facility] and it was May 2016) Current Attention Level: Sustained Memory: Decreased short-term memory, Decreased recall of precautions Following Commands: Follows one step commands with increased time Safety/Judgement: Decreased awareness of safety, Decreased awareness of deficits Awareness: Emergent Problem Solving: Slow processing General Comments: pt with poor carryover however can repeat instructions ie. required repeated v/c's to stay in walker. pt unable to stay in walker but kept repeating "stay in walker"  Physical Exam: Blood pressure 164/79, pulse 85, temperature 97.7 F (36.5 C), temperature source Oral, resp. rate 18, height 6' 3"  (1.905 m), weight 89.6 kg (197 lb 8.5 oz), SpO2 99 %. Physical Exam Constitutional: He appears well-developed.  HENT:  Mild left facial droop  Eyes:  Pupils reactive to light  Neck: Normal range of motion. Neck supple. No thyromegaly present.  Cardiovascular:  Cardiac rate controlled  Respiratory: Effort normal and breath sounds normal. No respiratory distress.  GI: Soft. Bowel sounds are normal. He exhibits no distension.  Neurological: He is alert.  Provides name and age. Follows simple commands. Patient has a right gaze preference. Fair awareness of deficits  Skin: Skin is warm and dry.  Motor 5/5 R delt Bi tri grip HF KE and ankle DF 4/5 Left Delt bi tri grip HF KE and ADF Sensory reduced to LT LUE>LLE, intact on Right side Visual field cut Left temporal field Left neglect    Lab Results Last 48 Hours    Results for orders placed or performed during the hospital encounter of 02/19/15 (from the past 48  hour(s))  Troponin I Status: Abnormal   Collection Time: 02/20/15 3:24 PM  Result Value Ref Range   Troponin I 0.06 (H) <0.031 ng/mL    Comment:   PERSISTENTLY INCREASED TROPONIN VALUES IN THE RANGE OF  0.04-0.49 ng/mL CAN BE SEEN IN:  -UNSTABLE ANGINA  -CONGESTIVE HEART FAILURE  -MYOCARDITIS  -CHEST TRAUMA  -ARRYHTHMIAS  -LATE PRESENTING MYOCARDIAL INFARCTION  -COPD  CLINICAL FOLLOW-UP RECOMMENDED.   Troponin I Status: Abnormal   Collection Time: 02/20/15 9:00 PM  Result Value Ref Range   Troponin I 0.07 (H) <0.031 ng/mL    Comment:   PERSISTENTLY INCREASED TROPONIN VALUES IN THE RANGE OF 0.04-0.49 ng/mL CAN BE SEEN IN:  -UNSTABLE ANGINA  -CONGESTIVE HEART FAILURE  -MYOCARDITIS  -CHEST TRAUMA  -ARRYHTHMIAS  -LATE PRESENTING MYOCARDIAL INFARCTION  -COPD  CLINICAL FOLLOW-UP RECOMMENDED.   Glucose, capillary Status: None   Collection Time: 02/20/15 11:56 PM  Result Value Ref Range   Glucose-Capillary 98 65 - 99 mg/dL   Comment 1 Notify RN   Glucose, capillary Status: Abnormal   Collection Time: 02/21/15 11:52 AM  Result Value Ref Range   Glucose-Capillary 133 (H) 65 - 99 mg/dL   Comment 1 Document in Chart   CBC Status: Abnormal   Collection Time: 02/22/15 4:00 AM  Result Value Ref Range   WBC 7.7 4.0 - 10.5 K/uL   RBC 4.81 4.22 - 5.81 MIL/uL   Hemoglobin 14.9 13.0 - 17.0 g/dL   HCT 44.9 39.0 - 52.0 %   MCV 93.3 78.0 - 100.0 fL   MCH 31.0 26.0 - 34.0 pg   MCHC 33.2 30.0 - 36.0 g/dL   RDW 14.5 11.5 - 15.5 %   Platelets 147 (L) 150 - 400 K/uL  Basic metabolic panel Status: Abnormal   Collection Time: 02/22/15 4:00 AM  Result Value Ref Range   Sodium 138 135 - 145 mmol/L   Potassium 3.7 3.5 - 5.1 mmol/L   Chloride 103 101 - 111 mmol/L    CO2 25 22 - 32 mmol/L   Glucose, Bld 114 (H) 65 - 99 mg/dL   BUN 12 6 - 20 mg/dL   Creatinine, Ser 0.96 0.61 - 1.24 mg/dL   Calcium 8.8 (L) 8.9 - 10.3 mg/dL   GFR calc non Af Amer >60 >60 mL/min   GFR calc Af Amer >60 >60 mL/min    Comment: (NOTE) The eGFR has been calculated using the CKD EPI equation. This calculation has not been validated in all clinical situations. eGFR's persistently <60 mL/min signify possible Chronic Kidney Disease.    Anion gap 10 5 - 15      Imaging Results (Last 48 hours)    Mr Brain Wo Contrast  02/20/2015 CLINICAL DATA: Left-sided paralysis. Recent endovascular procedure. EXAM: MRI HEAD WITHOUT CONTRAST MRA HEAD WITHOUT CONTRAST TECHNIQUE: Multiplanar, multiecho pulse sequences of the brain and surrounding structures were obtained without intravenous contrast. Angiographic images of the head were obtained using MRA technique without contrast. COMPARISON: CT head 522. Endovascular procedure 5/22 FINDINGS: MRI HEAD FINDINGS There is an extensive area of acute infarction affecting most of the RIGHT middle cerebral artery territory including the frontal, temporal, and parietal lobes, the insular cortex and subcortical white matter, as well as the basal ganglia including the caudate and lentiform nucleus. There is some sparing of the subcortical and periventricular white matter on the RIGHT. A tiny incidental region of cortical and subcortical infarction affects the LEFT lobe. Generalized atrophy. Chronic microvascular ischemic change. No petechial hemorrhage or foci of chronic hemorrhage. Flow void patency of the RIGHT ICA and MCA has been re-established. Cavum septum pellucidum and vergae. Early right-to-left midline shift estimated 3 mm. No extra-axial fluid collection. No tonsillar or uncal herniation. Extracranial soft  tissues grossly unremarkable. MRA HEAD FINDINGS Both internal carotid arteries are patent without  flow-limiting stenosis. Both middle cerebral arteries are patent without flow-limiting stenosis. Minor proximal oriented LEFT A1 ACA, non flow reducing. Minor irregularity of the proximal RIGHT M2 branches. Basilar artery widely patent with vertebrals codominant. Severe stenosis P2 RIGHT PCA. No cerebellar branch occlusion. No intracranial aneurysm or vascular occlusion. IMPRESSION: Extensive area of acute infarction affecting most of the RIGHT middle cerebral artery territory, including the basal ganglia. No hemorrhagic transformation. Tiny incidental area of ischemia LEFT occipital lobe, of no clinical significance. Early right-to-left shift of 3 mm. Patency of the proximal vasculature is been re-established with endovascular treatment. Electronically Signed By: Rolla Flatten M.D. On: 02/20/2015 18:01   Mr Jodene Nam Head/brain Wo Cm  02/20/2015 CLINICAL DATA: Left-sided paralysis. Recent endovascular procedure. EXAM: MRI HEAD WITHOUT CONTRAST MRA HEAD WITHOUT CONTRAST TECHNIQUE: Multiplanar, multiecho pulse sequences of the brain and surrounding structures were obtained without intravenous contrast. Angiographic images of the head were obtained using MRA technique without contrast. COMPARISON: CT head 522. Endovascular procedure 5/22 FINDINGS: MRI HEAD FINDINGS There is an extensive area of acute infarction affecting most of the RIGHT middle cerebral artery territory including the frontal, temporal, and parietal lobes, the insular cortex and subcortical white matter, as well as the basal ganglia including the caudate and lentiform nucleus. There is some sparing of the subcortical and periventricular white matter on the RIGHT. A tiny incidental region of cortical and subcortical infarction affects the LEFT lobe. Generalized atrophy. Chronic microvascular ischemic change. No petechial hemorrhage or foci of chronic hemorrhage. Flow void patency of the RIGHT ICA and MCA has been re-established. Cavum  septum pellucidum and vergae. Early right-to-left midline shift estimated 3 mm. No extra-axial fluid collection. No tonsillar or uncal herniation. Extracranial soft tissues grossly unremarkable. MRA HEAD FINDINGS Both internal carotid arteries are patent without flow-limiting stenosis. Both middle cerebral arteries are patent without flow-limiting stenosis. Minor proximal oriented LEFT A1 ACA, non flow reducing. Minor irregularity of the proximal RIGHT M2 branches. Basilar artery widely patent with vertebrals codominant. Severe stenosis P2 RIGHT PCA. No cerebellar branch occlusion. No intracranial aneurysm or vascular occlusion. IMPRESSION: Extensive area of acute infarction affecting most of the RIGHT middle cerebral artery territory, including the basal ganglia. No hemorrhagic transformation. Tiny incidental area of ischemia LEFT occipital lobe, of no clinical significance. Early right-to-left shift of 3 mm. Patency of the proximal vasculature is been re-established with endovascular treatment. Electronically Signed By: Rolla Flatten M.D. On: 02/20/2015 18:01        Medical Problem List and Plan: 1. Functional deficits secondary to right MCA infarct as well as left occipital small infarct embolic secondary to atrial fibrillation 2. DVT Prophylaxis/Anticoagulation: SCDs. Monitor for any signs of DVT 3. Pain Management: Tylenol as needed 4. Complete heart block/atrial fibrillation/bradycardia. CRT pacemaker 02/22/2015 per Dr. Lovena Le. Cardiac rate controlled 5. Neuropsych: This patient is capable of making decisions on his own behalf. 6. Skin/Wound Care: Routine skin checks 7. Fluids/Electrolytes/Nutrition: Routine I&O with follow-up chemistries 8. Hypertension. Coreg 3.125 mg twice a day, lisinopril 20 mg daily. Monitor with increased mobility 9. Chronic systolic congestive heart failure. Monitor for any signs of fluid overload 10. Hyperlipidemia. Lipitor 11. BPH. Continue Flomax. Check  PVR 3  Post Admission Physician Evaluation: Functional deficits secondary to  right MCA infarct as well as left occipital small infarct embolic secondary to atrial fibrillation 1. . 2. Patient is admitted to receive collaborative, interdisciplinary care between the physiatrist, rehab nursing  staff, and therapy team. 3. Patient's level of medical complexity and substantial therapy needs in context of that medical necessity cannot be provided at a lesser intensity of care such as a SNF. 4. Patient has experienced substantial functional loss from his/her baseline which was documented above under the "Functional History" and "Functional Status" headings. Judging by the patient's diagnosis, physical exam, and functional history, the patient has potential for functional progress which will result in measurable gains while on inpatient rehab. These gains will be of substantial and practical use upon discharge in facilitating mobility and self-care at the household level. 5. Physiatrist will provide 24 hour management of medical needs as well as oversight of the therapy plan/treatment and provide guidance as appropriate regarding the interaction of the two. 6. 24 hour rehab nursing will assist with bladder management, bowel management, safety, skin/wound care, disease management, medication administration and patient education and help integrate therapy concepts, techniques,education, etc. 7. PT will assess and treat for/with: pre gait, gait training, endurance , safety, equipment, neuromuscular re education. Goals are: Sup. 8. OT will assess and treat for/with: ADLs, Cognitive perceptual skills, Neuromuscular re education, safety, endurance, equipment. Goals are: Sup. Therapy may not proceed with showering this patient. 9. SLP will assess and treat for/with: Memory, attention, problem solving, medication management,, left neglect. Goals are: Sup med management. 10. Case Management and Social Worker  will assess and treat for psychological issues and discharge planning. 11. Team conference will be held weekly to assess progress toward goals and to determine barriers to discharge. 12. Patient will receive at least 3 hours of therapy per day at least 5 days per week. 13. ELOS: 1-14d  14. Prognosis: good     [name] M.D. [facility] FAAPM&R (Sports Med, Neuromuscular Med) Diplomate Am Board of Electrodiagnostic Med 02/24/15

## 2015-02-25 ENCOUNTER — Inpatient Hospital Stay (HOSPITAL_COMMUNITY): Payer: Medicare PPO | Admitting: Speech Pathology

## 2015-02-25 ENCOUNTER — Inpatient Hospital Stay (HOSPITAL_COMMUNITY): Payer: Medicare PPO | Admitting: Occupational Therapy

## 2015-02-25 ENCOUNTER — Inpatient Hospital Stay (HOSPITAL_COMMUNITY): Payer: Medicare PPO | Admitting: Physical Therapy

## 2015-02-25 DIAGNOSIS — I5020 Unspecified systolic (congestive) heart failure: Secondary | ICD-10-CM

## 2015-02-25 DIAGNOSIS — N400 Enlarged prostate without lower urinary tract symptoms: Secondary | ICD-10-CM

## 2015-02-25 DIAGNOSIS — I63511 Cerebral infarction due to unspecified occlusion or stenosis of right middle cerebral artery: Secondary | ICD-10-CM

## 2015-02-25 NOTE — Evaluation (Signed)
Speech Language Pathology Assessment and Plan  Patient Details  Name: Parker [name] MRN: [id number] Date of Birth: Nov 21, 1926  SLP Diagnosis: Cognitive Impairments  Rehab Potential: Fair ELOS: 14-21 days    Today's Date: 02/25/2015 SLP Individual Time: 0277-4128 SLP Individual Time Calculation (min): 60 min   Problem List:  Patient Active Problem List   Diagnosis Date Noted  . Acute urinary retention 02/23/2015  . Atrial fibrillation 02/23/2015  . Benign hypertension 02/23/2015  . Hyperlipidemia LDL goal <70 02/23/2015  . Leukocytosis 02/23/2015  . Urinary retention 02/23/2015  . Pacemaker   . Stroke with cerebral ischemia   . Acute right MCA stroke 02/22/2015  . Acute left hemiparesis 02/22/2015  . Hemi-neglect of left side 02/22/2015  . Left homonymous hemianopsia 02/22/2015  . Anosognosia 02/22/2015  . Complete heart block   . Chronic systolic heart failure   . 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 Cardiomyopathy 02/24/2014   Past Medical History:  Past Medical History  Diagnosis Date  . MI (myocardial infarction)   . Hypertension   . Hyperlipidemia   . Bradycardia     per girlfriend, has refused pacemaker   . Chronic systolic heart failure     35%-45%,  2013  . Head and neck cancer     surgical resection, no XRT  . Atrial fibrillation    Past Surgical History:  Past Surgical History  Procedure Laterality Date  . Back surgery    . Radiology with anesthesia N/A 02/19/2015    Procedure: RADIOLOGY WITH ANESTHESIA;  Surgeon: Luanne Bras, MD;  Location: [facility];  Service: Radiology;  Laterality: N/A;  . Ep implantable device N/A 02/22/2015    Procedure: BiV Pacemaker Insertion CRT-P;  Surgeon: Evans Lance, MD;  Location: [facility];  Service: Cardiovascular;  Laterality: N/A;    Assessment / Plan / Recommendation Clinical Impression Parker [name] is a 81 y.o. right handed male with history of CAD  with stenting, hypertension, chronic systolic congestive heart failure, atrial fibrillation maintained on aspirin. Independent and active prior to admission. He presented 02/19/2015 with left-sided weakness altered mental status and right gaze preference. 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 Patient did receive TPA. MRI and MRA showed extensive area of acute infarct right middle cerebral artery territory as well as left occipital small infarct. Echocardiogram with ejection fraction of 78% grade 2 diastolic dysfunction. Findings of occluded right MCA M1 segment underwent complete revascularization for interventional radiology. Patient did require ventilatory support for a short time. EKG with atrial fibrillation with complete heart block as well as bouts of bradycardia. Cardiology consulted Currently maintained on aspirin 325 mg daily for CVA prophylaxis as well as subcutaneous Lovenox for DVT prophylaxis. A pacemaker was placed in light of heart block and bradycardia 02/22/2015 per Dr. Lovena Le. Tolerating a regular consistency diet. Physical therapy evaluation completed 02/21/2015 with recommendations of physical medicine rehabilitation consult   Skilled Therapeutic Interventions          Skilled cognitive evaluation completed; see above report.  Reviewed results and recommendations with patient and patient's family.  SLP Assessment  Patient will need skilled [facility] during CIR admission    Recommendations  Recommendations for Other Services: Neuropsych consult Patient destination: Home Follow up Recommendations: Home Health SLP;Outpatient SLP Equipment Recommended: None recommended by SLP    SLP Frequency 3 to 5 out of 7 days   SLP Treatment/Interventions  Cognitive remediation/compensation;Cueing hierarchy;Functional tasks;Environmental controls;Internal/external aids;Medication managment;Patient/family  education;Speech/Language facilitation    Pain Pain Assessment Pain Assessment: No/denies pain Prior Functioning Cognitive/Linguistic Baseline: Baseline deficits Baseline deficit details: mild short-term recall Type of Home: House  Lives With: Significant other;Alone Available Help at Discharge: Family;Friend(s) Vocation: Retired  Industrial/product designer Term Goals: Week 1: SLP Short Term Goal 1 (Week 1): Pt will identify 2 physical deficits and 2 cognitive deficits with max A given multimodal cueing. SLP Short Term Goal 2 (Week 1): Pt will recall 2 new pieces of information during basic functional tasks with mod A given verbal/visual cueing SLP Short Term Goal 3 (Week 1): Pt will complete basic problem solving tasks during functional tasks with mod-max A given multimodal cueing.  See FIM for current functional status Refer to Care Plan for Long Term Goals  Recommendations for other services: Neuropsych  Discharge Criteria: Patient will be discharged from SLP if patient refuses treatment 3 consecutive times without medical reason, if treatment goals not met, if there is a change in medical status, if patient makes no progress towards goals or if patient is discharged from hospital.  The above assessment, treatment plan, treatment alternatives and goals were discussed and mutually agreed upon: by patient and by family  Annabell Howells, MA CCC-SLP Annabell Howells A 02/25/2015, 5:12 PM

## 2015-02-25 NOTE — Evaluation (Signed)
Physical Therapy Assessment and Plan  Patient Details  Name: Parker [name] MRN: [id number] Date of Birth: 11-Jul-1927  PT Diagnosis: Abnormal posture, Abnormality of gait, Cognitive deficits, Difficulty walking, Hemiparesis non-dominant, Impaired sensation, Muscle weakness and Pain in shoulder/neck Rehab Potential: Good ELOS: 12-14 days   Today's Date: 02/25/2015 PT Individual Time: 1000-1100 PT Individual Time Calculation (min): 60 min    Problem List:  Patient Active Problem List   Diagnosis Date Noted  . Acute urinary retention 02/23/2015  . Atrial fibrillation 02/23/2015  . Benign hypertension 02/23/2015  . Hyperlipidemia LDL goal <70 02/23/2015  . Leukocytosis 02/23/2015  . Urinary retention 02/23/2015  . Pacemaker   . Stroke with cerebral ischemia   . Acute right MCA stroke 02/22/2015  . Acute left hemiparesis 02/22/2015  . Hemi-neglect of left side 02/22/2015  . Left homonymous hemianopsia 02/22/2015  . Anosognosia 02/22/2015  . Complete heart block   . Chronic systolic heart failure   . 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 Cardiomyopathy 02/24/2014    Past Medical History:  Past Medical History  Diagnosis Date  . MI (myocardial infarction)   . Hypertension   . Hyperlipidemia   . Bradycardia     per girlfriend, has refused pacemaker   . Chronic systolic heart failure     35%-45%,  2013  . Head and neck cancer     surgical resection, no XRT  . Atrial fibrillation    Past Surgical History:  Past Surgical History  Procedure Laterality Date  . Back surgery    . Radiology with anesthesia N/A 02/19/2015    Procedure: RADIOLOGY WITH ANESTHESIA;  Surgeon: Luanne Bras, MD;  Location: [facility];  Service: Radiology;  Laterality: N/A;  . Ep implantable device N/A 02/22/2015    Procedure: BiV Pacemaker Insertion CRT-P;  Surgeon: Evans Lance, MD;  Location: [facility];  Service: Cardiovascular;  Laterality:  N/A;    Assessment & Plan Clinical Impression: Patient is a 81 y.o. right handed male with history of CAD with stenting, hypertension, chronic systolic congestive heart failure, atrial fibrillation maintained on aspirin. Independent and active prior to admission. He presented 02/19/2015 with left-sided weakness altered mental status and right gaze preference. 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 Patient did receive TPA. MRI and MRA showed extensive area of acute infarct right middle cerebral artery territory as well as left occipital small infarct. Echocardiogram with ejection fraction of 40% grade 2 diastolic dysfunction. Findings of occluded right MCA M1 segment underwent complete revascularization for interventional radiology. Patient did require ventilatory support for a short time. EKG with atrial fibrillation with complete heart block as well as bouts of bradycardia. Cardiology consulted Currently maintained on aspirin 325 mg daily for CVA prophylaxis as well as subcutaneous Lovenox for DVT prophylaxis. A pacemaker was placed in light of heart block and bradycardia 02/22/2015 per Dr. Lovena Le. Tolerating a regular consistency diet.  Patient transferred to CIR on 02/24/2015 .   Patient currently requires mod with mobility secondary to muscle weakness and muscle joint tightness, impaired timing and sequencing and decreased motor planning, decreased visual motor skills, decreased attention to left, decreased attention, decreased awareness, decreased problem solving and decreased safety awareness and decreased standing balance, decreased postural control, decreased balance strategies and hemiparesis.  Prior to hospitalization, patient was independent  with mobility and lived with Significant other, Alone (spends 1/2 week at home alone, 1/2 week at girlfriend's  condo) in a House (pt has tri level house; girlfriend has one level condo) home.  Home access is  Level  entry.  Patient will benefit from skilled PT intervention to maximize safe functional mobility, minimize fall risk and decrease caregiver burden for planned discharge home with 24 hour supervision.  Anticipate patient will benefit from follow up [facility] at discharge.  PT - End of Session Activity Tolerance: Tolerates 30+ min activity with multiple rests Endurance Deficit: Yes Endurance Deficit Description: cardiac PT Assessment Rehab Potential (ACUTE/IP ONLY): Good Barriers to Discharge: Decreased caregiver support (unsure of 24/7 supervision at D/C) PT Patient demonstrates impairments in the following area(s): Balance;Endurance;Motor;Pain;Perception;Safety;Sensory PT Transfers Functional Problem(s): Bed Mobility;Bed to Chair;Car;Furniture PT Locomotion Functional Problem(s): Ambulation;Stairs PT Plan PT Intensity: Minimum of 1-2 x/day ,45 to 90 minutes PT Frequency: 5 out of 7 days PT Duration Estimated Length of Stay: 12-14 days PT Treatment/Interventions: Ambulation/gait training;Balance/vestibular training;Cognitive remediation/compensation;Community reintegration;Discharge planning;Disease management/prevention;DME/adaptive equipment instruction;Functional mobility training;Neuromuscular re-education;Pain management;Patient/family education;Psychosocial support;Splinting/orthotics;Stair training;Therapeutic Activities;Therapeutic Exercise;UE/LE Strength taining/ROM;Visual/perceptual remediation/compensation PT Transfers Anticipated Outcome(s): Supervision PT Locomotion Anticipated Outcome(s): Supervision PT Recommendation Recommendations for Other Services: Neuropsych consult Follow Up Recommendations: Home health PT;24 hour supervision/assistance Patient destination: Home Equipment Recommended: To be determined;Wheelchair (measurements);Wheelchair cushion (measurements) Equipment Details: AD for ambulation TBD as pt progresses  Skilled Therapeutic Intervention Pt participated in PT  evaluation.  Pt began to report pain in bilat shoulders and neck and requested to lie down to rest.  Once in supine on mat performed PROM/stretches of R shoulder and neck musculature with pt reporting relief in neck/shoulder pain on R but continued to c/o pain L neck, unable to perform PROM or stretches to L shoulder due to new Pacemaker placement.  Returned to sitting and to w/c to room.  Discussed with pt and girlfriend home set up, ELOS and goals of therapy; recommending 24/7 supervision at D/C.  Girlfriend reports pt will D/C to her one level condo but it is on the 3rd level and has to take elevator to 3rd level.  Girlfriend asking about training/family education prior to D/C.  Educated girlfriend on formal family education process prior to D/C.   Pt transferred to recliner with mod A and pt set up with all items within reach.   PT Evaluation Precautions/Restrictions Precautions Precautions: ICD/Pacemaker;Fall Precaution Comments: CHF, AFIB, heart block and Pacemaker-avoid lifting LUE past shoulder level, do not lift >10lb and no pushing/pulling LUE General Chart Reviewed: Yes Response to Previous Treatment: Patient with no complaints from previous session. Family/Caregiver Present: Yes (daughter and granddaughter)  Pain Pain Assessment Pain Assessment: Faces Faces Pain Scale: Hurts little more Pain Type: Acute pain Pain Location: Eye Pain Orientation: Right Pain Descriptors / Indicators: Pressure Pain Onset: On-going Pain Intervention(s): Rest Home Living/Prior Functioning Home Living Available Help at Discharge: Family;Friend(s) Type of Home: House (pt has tri level house; girlfriend has one level condo) Home Access: Level entry Home Layout: Multi-level;One level (pt home is tri level, girlfriend's condo is one level but has to take elevator to 3rd level to access it ) Alternate Level Stairs-Number of Steps: 7 + 7 Alternate Level Stairs-Rails: Right;Left;Can reach both Additional  Comments: Pt plans to D/C to girlfriend's 3rd level condo, can take elevator.  Girlfriend to provide supervision   Lives With: Significant other;Alone (spends 1/2 week at home alone, 1/2 week at girlfriend's condo) Prior Function Level of Independence: Independent with gait;Independent with transfers  Able to Take Stairs?: Yes Driving: Yes Vocation: Retired Comments: Independent, drove, ran errands, etc.  Vision/Perception  Vision - Assessment Eye Alignment: Impaired (comment) (R eye elevated) Ocular Range of Motion: Restricted looking up;Restricted on the right Alignment/Gaze Preference: Head tilt;Chin down Tracking/Visual Pursuits: Decreased smoothness of horizontal tracking;Decreased smoothness of vertical tracking;Requires cues, head turns, or add eye shifts to track;Unable to hold eye position out of midline (especially in R eye) Saccades: Additional eye shifts occurred during testing;Decreased speed of saccadic movement Convergence: Impaired (comment) Additional Comments: Would consistently close R and both eyes; reports pressure behind R eye  Cognition Overall Cognitive Status: Impaired/Different from baseline Sensation Sensation Light Touch: Impaired Detail Light Touch Impaired Details: Impaired LLE;Absent LLE Stereognosis: Not tested Hot/Cold: Not tested Proprioception: Impaired by gross assessment Additional Comments: 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, 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 Coordination Gross Motor Movements are Fluid and Coordinated: Yes Motor  Motor Motor: Hemiplegia;Motor impersistence Motor - Skilled Clinical Observations: mild L hemiparesis; impaired motor planning  Mobility Bed Mobility Bed Mobility: Supine to Sit;Sit to Supine Supine to Sit: HOB flat;3: Mod assist Supine to Sit Details (indicate cue type and reason): Mod A for rolling and side > sit and mod verbal cues for initiation and sequencing Sit to Supine: 3: Mod assist;HOB flat Sit to Supine - Details  (indicate cue type and reason): Mod A for controlled sit > supine and mod verbal cues for initiation and sequencing Transfers Transfers: Yes Stand Pivot Transfers: 3: Mod assist Stand Pivot Transfer Details (indicate cue type and reason): Mod lifting assistance needed from mat, w/c or recliner with mod verbal cues for intiation and sequencing of pivot and for safe hand placement Locomotion  Ambulation Ambulation: Yes Ambulation/Gait Assistance: 3: Mod assist Ambulation Distance (Feet): 20 Feet Assistive device: 1 person hand held assist Gait Gait: Yes Gait Pattern: Impaired Gait Pattern: Step-through pattern;Decreased step length - right;Decreased step length - left;Decreased stride length;Decreased dorsiflexion - left;Shuffle;Decreased trunk rotation Stairs / Additional Locomotion Stairs: Yes Stairs Assistance: 3: Mod assist Stairs Assistance Details: Manual facilitation for weight shifting;Verbal cues for precautions/safety;Verbal cues for sequencing Stair Management Technique: Two rails;Alternating pattern;Step to pattern;Forwards Number of Stairs: 8 Height of Stairs: 6 Wheelchair Mobility Wheelchair Mobility: No (due to recent pacemaker placement) Distance: 150  Trunk/Postural Assessment  Cervical Assessment Cervical Assessment: Exceptions to [facility] (keeps neck flexed, laterally flexed and rotated; resists PROM) Thoracic Assessment Thoracic Assessment: Exceptions to [facility] (decreased excursion to L) Lumbar Assessment Lumbar Assessment: Within Functional Limits Postural Control Postural Control: Deficits on evaluation (decreased excursion to L, impaired balance reactions in standing)  Balance Balance Balance Assessed: Yes Static Sitting Balance Static Sitting - Level of Assistance: 6: Modified independent (Device/Increase time) Dynamic Sitting Balance Dynamic Sitting - Level of Assistance: 5: Stand by assistance Static Standing Balance Static Standing - Level of Assistance: 3:  Mod assist Dynamic Standing Balance Dynamic Standing - Level of Assistance: 3: Mod assist Extremity Assessment  RLE Assessment RLE Assessment: Exceptions to [facility] RLE Strength RLE Overall Strength: Deficits RLE Overall Strength Comments: 3/5 hip flexion; 4-/5 knee and ankle LLE Assessment LLE Assessment: Exceptions to WFL LLE Strength LLE Overall Strength: Deficits LLE Overall Strength Comments: 3+/5 overall  FIM:  FIM - Bed/Chair Transfer Bed/Chair Transfer Assistive Devices: Arm rests Bed/Chair Transfer: 3: Supine > Sit: Mod A (lifting assist/Pt. 50-74%/lift 2 legs;4: Sit > Supine: Min A (steadying pt. > 75%/lift 1 leg);3: Bed > Chair or W/C: Mod A (lift or lower assist);3: Chair or W/C > Bed: Mod A (lift or lower assist) FIM - Locomotion: Wheelchair Distance: 150 Locomotion: Wheelchair: 1:  Total Assistance/staff pushes wheelchair (Pt<25%) FIM - Locomotion: Ambulation Locomotion: Ambulation Assistive Devices: Other (comment) (HHA) Ambulation/Gait Assistance: 3: Mod assist Locomotion: Ambulation: 1: Travels less than 50 ft with moderate assistance (Pt: 50 - 74%) FIM - Locomotion: Stairs Locomotion: Scientist, physiological: Hand rail - 2 Locomotion: Stairs: 2: Up and Down 4 - 11 stairs with moderate assistance (Pt: 50 - 74%)   Refer to Care Plan for Long Term Goals  Recommendations for other services: Neuropsych  Discharge Criteria: Patient will be discharged from PT if patient refuses treatment 3 consecutive times without medical reason, if treatment goals not met, if there is a change in medical status, if patient makes no progress towards goals or if patient is discharged from hospital.  The above assessment, treatment plan, treatment alternatives and goals were discussed and mutually agreed upon: by patient and by family  Malachy Mood 02/25/2015, 12:52 PM

## 2015-02-25 NOTE — Evaluation (Addendum)
Occupational Therapy Assessment and Plan  Patient Details  Name: Parker [name] MRN: [id number] Date of Birth: 05-15-1927  OT Diagnosis: abnormal posture, ataxia and cognitive deficits Rehab Potential: Rehab Potential (ACUTE ONLY): Good ELOS: 10-14 days   Today's Date: 02/25/2015 OT Individual Time: 0076-2263 OT Individual Time Calculation (min): 60 min     Problem List:  Patient Active Problem List   Diagnosis Date Noted  . Acute urinary retention 02/23/2015  . Atrial fibrillation 02/23/2015  . Benign hypertension 02/23/2015  . Hyperlipidemia LDL goal <70 02/23/2015  . Leukocytosis 02/23/2015  . Urinary retention 02/23/2015  . Pacemaker   . Stroke with cerebral ischemia   . Acute right MCA stroke 02/22/2015  . Acute left hemiparesis 02/22/2015  . Hemi-neglect of left side 02/22/2015  . Left homonymous hemianopsia 02/22/2015  . Anosognosia 02/22/2015  . Complete heart block   . Chronic systolic heart failure   . 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 Cardiomyopathy 02/24/2014    Past Medical History:  Past Medical History  Diagnosis Date  . MI (myocardial infarction)   . Hypertension   . Hyperlipidemia   . Bradycardia     per girlfriend, has refused pacemaker   . Chronic systolic heart failure     35%-45%,  2013  . Head and neck cancer     surgical resection, no XRT  . Atrial fibrillation    Past Surgical History:  Past Surgical History  Procedure Laterality Date  . Back surgery    . Radiology with anesthesia N/A 02/19/2015    Procedure: RADIOLOGY WITH ANESTHESIA;  Surgeon: Luanne Bras, MD;  Location: [facility];  Service: Radiology;  Laterality: N/A;  . Ep implantable device N/A 02/22/2015    Procedure: BiV Pacemaker Insertion CRT-P;  Surgeon: Evans Lance, MD;  Location: [facility];  Service: Cardiovascular;  Laterality: N/A;    Assessment & Plan Clinical ImpressionPatient is a 81 y.o. right handed male  with history of CAD with stenting, hypertension, chronic systolic congestive heart failure, atrial fibrillation maintained on aspirin. Independent and active prior to admission. He presented 02/19/2015 with left-sided weakness altered mental status and right gaze preference. 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 Patient did receive TPA. MRI and MRA showed extensive area of acute infarct right middle cerebral artery territory as well as left occipital small infarct. Echocardiogram with ejection fraction of 33% grade 2 diastolic dysfunction. Findings of occluded right MCA M1 segment underwent complete revascularization for interventional radiology. Patient did require ventilatory support for a short time. EKG with atrial fibrillation with complete heart block as well as bouts of bradycardia. Cardiology consulted Currently maintained on aspirin 325 mg daily for CVA prophylaxis as well as subcutaneous Lovenox for DVT prophylaxis. A pacemaker was placed in light of heart block and bradycardia 02/22/2015 per Dr. Lovena Le. Tolerating a regular consistency diet. Patient transferred to CIR on 02/24/2015 .  Patient transferred to CIR on 02/24/2015 .    Patient currently requires mod with basic self-care skills secondary to muscle weakness and muscle paralysis, abnormal tone, ataxia and decreased coordination and decreased attention, decreased awareness, decreased problem solving and decreased memory.  Prior to hospitalization, patient could complete BADL with independent .  Patient will benefit from skilled intervention to increase independence with basic self-care skills prior to discharge home with care partner.  Anticipate patient will require 24 hour supervision and follow up home health.  OT -  End of Session Activity Tolerance: Tolerates 10 - 20 min activity with multiple rests Endurance Deficit: Yes Endurance Deficit Description: cardiac OT Assessment Rehab  Potential (ACUTE ONLY): Good Barriers to Discharge: Other (comment) (lives with SO; no barriers in her home) OT Plan OT Intensity: Minimum of 1-2 x/day, 45 to 90 minutes OT Frequency: 5 out of 7 days OT Duration/Estimated Length of Stay: 10-14 days OT Treatment/Interventions: Teacher, English as a foreign language OT Self Feeding Anticipated Outcome(s): independent OT Basic Self-Care Anticipated Outcome(s): supervision OT Toileting Anticipated Outcome(s): supervision OT Bathroom Transfers Anticipated Outcome(s): supervision OT Recommendation Recommendations for Other Services: Speech consult Patient destination: Home Follow Up Recommendations: Home health OT Equipment Recommended: Tub/shower bench   Skilled Therapeutic Intervention Brief Interview for Mental Status:    Memory:  Word Repetition:  Recalled 3/3 "Sock"  "Blue"  "Bed"    Orientation Level:    Year:   2016  Month: May  Day of Week:  :sat   Memory:  Recall: 1/3 after 5 mins      "Sock"  with cue     "Blue"  with no cues                                                   "Bed"   with cue         OT Evaluation Precautions/Restrictions  Precautions Precautions: ICD/Pacemaker;Fall Precaution Comments: CHF, AFIB, heart block and Pacemaker-avoid lifting LUE past shoulder level, do not lift >10lb and no pushing/pulling LUE Restrictions Weight Bearing Restrictions: Yes LUE Weight Bearing:  (no pushing, no pulling; no lifting LUE past 90 degrees) General   Vital Signs   Pain Pain Assessment Pain Assessment: No/denies pain Home Living/Prior Functioning Home Living Available Help at Discharge: Family [name], daughter can assist) Type of Home: House Home Access: Level entry (SO house is one level; pt's  3 levels, 7 steps, 2 rails) Home Layout: One level (lives on 3rd floor with elevator; condo is one level) Alternate Level Stairs-Number of Steps: no steps to condo and no steps  insiide Alternate Level Stairs-Rails: Right, Left, Can reach both (steps are at pt's home, but SO has 0 steps) Additional Comments: Pt plans to D/C to girlfriend's 3rd level condo, can take elevator.  Girlfriend to provide supervision   Lives With:  (lives with [name], (SO), but goes to feed dog every day) IADL History Homemaking Responsibilities: Yes Meal Prep Responsibility: Secondary Laundry Responsibility: Secondary Cleaning Responsibility: Secondary Bill Paying/Finance Responsibility: Secondary (Pt. writes checks, but Peggys checks over them) Current License: Yes Mode of TransportationOccupational psychologist (truck) Leisure and Hobbies:  (likes westerns, jeopardy, woffortune, watch Christian tv) IADL Comments: cooks cabbage, potaotes, eggs Prior Function Level of Independence: Independent with basic ADLs, Independent with gait, Independent with homemaking with ambulation  Able to Take Stairs?: Reciprically Driving: Yes Vocation: Retired Comments: Independent, drove, ran errands, etc. ADL   Vision/Perception  Vision- Assessment Eye Alignment: Impaired (comment) Ocular Range of Motion: Restricted looking up;Restricted on the right Tracking/Visual Pursuits: Decreased smoothness of horizontal tracking;Decreased smoothness of vertical tracking;Requires cues, head turns, or add eye shifts to track;Unable to hold eye position out of midline Saccades: Additional eye shifts occurred during testing;Decreased speed of saccadic movement Perception Perception: Impaired Inattention/Neglect: Does not attend to right visual field  Cognition Overall Cognitive Status: Impaired/Different from baseline Arousal/Alertness: Awake/alert Orientation Level: Oriented to  person;Oriented to place;Oriented to situation;Disoriented to time Attention: Sustained Sustained Attention: Impaired Sustained Attention Impairment: Functional basic;Verbal basic Memory: Impaired Memory Impairment: Storage deficit;Retrieval  deficit;Decreased recall of new information;Decreased short term memory Decreased Short Term Memory: Verbal basic Awareness: Impaired Awareness Impairment: Intellectual impairment Problem Solving: Impaired Problem Solving Impairment: Verbal basic;Functional basic Executive Function: Self Correcting;Self Monitoring Self Monitoring: Impaired Self Monitoring Impairment: Verbal basic;Functional basic Self Correcting: Impaired Self Correcting Impairment: Verbal basic;Functional basic Behaviors: Perseveration Safety/Judgment: Impaired Comments: .bims; recalled 1/3 words after 5 minutes Sensation Sensation Light Touch: Appears Intact (intact for BUE) Light Touch Impaired Details: Impaired LLE;Absent LLE Proprioception: Impaired by gross assessment Additional Comments: 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, 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 Coordination Gross Motor Movements are Fluid and Coordinated: Yes Fine Motor Movements are Fluid and Coordinated: No Motor  Motor Motor: Hemiplegia;Motor impersistence Motor - Skilled Clinical Observations: mild L hemiparesis; impaired motor planning Mobility  Bed Mobility Bed Mobility: Supine to Sit;Sit to Supine Supine to Sit: HOB flat;3: Mod assist Sit to Supine: 3: Mod assist;HOB flat Sit to Supine - Details: Verbal cues for precautions/safety;Verbal cues for sequencing  Trunk/Postural Assessment  Cervical Assessment Cervical Assessment: Exceptions to [facility] Thoracic Assessment Thoracic Assessment: Exceptions to [facility] Lumbar Assessment Lumbar Assessment: Exceptions to [facility] Postural Control Postural Control:  (decreased balance reactions and excursion to left)  Balance Balance Balance Assessed: Yes Static Sitting Balance Static Sitting - Balance Support: Feet supported Static Sitting - Level of Assistance: 6: Modified independent (Device/Increase time) Dynamic Sitting Balance Dynamic Sitting - Balance Support: Feet supported Dynamic Sitting - Level of  Assistance: 4: Min assist Dynamic Sitting - Balance Activities: Reaching for objects;Lateral lean/weight shifting;Forward lean/weight shifting Static Standing Balance Static Standing - Level of Assistance: 3: Mod assist Dynamic Standing Balance Dynamic Standing - Level of Assistance: 3: Mod assist Extremity/Trunk Assessment RUE Assessment RUE Assessment: Within Functional Limits LUE Assessment LUE Assessment: Exceptions to WFL LUE AROM (degrees) Overall AROM Left Upper Extremity: Deficits (left pace maker)  FIM:  FIM - Eating Eating Activity: 5: Needs verbal cues/supervision FIM - Grooming Grooming: 0: Activity did not occur FIM - Bathing Bathing: 0: Activity did not occur FIM - Upper Body Dressing/Undressing Upper body dressing/undressing: 0: Activity did not occur FIM - Lower Body Dressing/Undressing Lower body dressing/undressing: 0: Activity did not occur FIM - Toileting Toileting steps completed by patient: Performs perineal hygiene Toileting Assistive Devices: Grab bar or rail for support Toileting: 0: Activity did not occur FIM - Control and instrumentation engineer Devices: Arm rests Bed/Chair Transfer: 3: Supine > Sit: Mod A (lifting assist/Pt. 50-74%/lift 2 legs;4: Sit > Supine: Min A (steadying pt. > 75%/lift 1 leg);3: Bed > Chair or W/C: Mod A (lift or lower assist);3: Chair or W/C > Bed: Mod A (lift or lower assist) FIM - Air cabin crew Transfers: 0-Activity did not occur FIM - Tub/Shower Transfers Tub/shower Transfers: 0-Activity did not occur or was simulated   Refer to Care Plan for Long Term Goals  Recommendations for other services: None  Discharge Criteria: Patient will be discharged from OT if patient refuses treatment 3 consecutive times without medical reason, if treatment goals not met, if there is a change in medical status, if patient makes no progress towards goals or if patient is discharged from hospital.  The above  assessment, treatment plan, treatment alternatives and goals were discussed and mutually agreed upon: by patient and by family  Lisa Roca 02/25/2015, 8:05 PM

## 2015-02-25 NOTE — Progress Notes (Signed)
Parker [name] is a 81 y.o. male November 18, 1926 [id number]  Subjective: No new complaints. No new problems. Slept well. Feeling OK.  Objective: Vital signs in last 24 hours: Temp:  [97.4 F (36.3 C)-98.4 F (36.9 C)] 98.4 F (36.9 C) (05/28 0550) Pulse Rate:  [69-70] 69 (05/28 0550) Resp:  [18] 18 (05/28 0550) BP: (148-169)/(70-85) 148/77 mmHg (05/28 0550) SpO2:  [95 %-98 %] 97 % (05/28 0550) Weight:  [191 lb 9.3 oz (86.9 kg)] 191 lb 9.3 oz (86.9 kg) (05/27 1625) Weight change:  Last BM Date: 02/24/15  Intake/Output from previous day: 05/27 0701 - 05/28 0700 In: 240 [P.O.:240] Out: -  Last cbgs: CBG (last 3)  No results for input(s): GLUCAP in the last 72 hours.   Physical Exam General: No apparent distress   HEENT: not dry Lungs: Normal effort. Lungs clear to auscultation, no crackles or wheezes. Cardiovascular: Regular rate and rhythm, no edema Abdomen: S/NT/ND; BS(+) Musculoskeletal:  unchanged Neurological: No new neurological deficits Wounds: N/A    Skin: clear  Aging changes Mental state: Alert, oriented, cooperative    Lab Results: BMET    Component Value Date/Time   NA 137 02/24/2015 0320   K 3.7 02/24/2015 0320   CL 101 02/24/2015 0320   CO2 25 02/24/2015 0320   GLUCOSE 105* 02/24/2015 0320   BUN 13 02/24/2015 0320   CREATININE 0.98 02/24/2015 0320   CALCIUM 8.7* 02/24/2015 0320   GFRNONAA >60 02/24/2015 0320   GFRAA >60 02/24/2015 0320   CBC    Component Value Date/Time   WBC 6.6 02/24/2015 0320   RBC 4.87 02/24/2015 0320   HGB 15.2 02/24/2015 0320   HCT 44.7 02/24/2015 0320   PLT 125* 02/24/2015 0320   MCV 91.8 02/24/2015 0320   MCH 31.2 02/24/2015 0320   MCHC 34.0 02/24/2015 0320   RDW 13.9 02/24/2015 0320   LYMPHSABS 0.7 02/20/2015 0512   MONOABS 0.6 02/20/2015 0512   EOSABS 0.0 02/20/2015 0512   BASOSABS 0.0 02/20/2015 0512    Studies/Results: No results found.  Medications: I have reviewed the patient's current  medications.  Assessment/Plan:   1. Functional deficits secondary to right MCA infarct as well as left occipital small infarct embolic secondary to atrial fibrillation 2. DVT Prophylaxis/Anticoagulation: SCDs. Monitor for any signs of DVT 3. Pain Management: Tylenol as needed 4. Complete heart block/atrial fibrillation/bradycardia. CRT pacemaker 02/22/2015 per Dr. Lovena Le. Cardiac rate controlled 5. Neuropsych: This patient is capable of making decisions on his own behalf. 6. Skin/Wound Care: Routine skin checks 7. Fluids/Electrolytes/Nutrition: Routine I&O with follow-up chemistries 8. Hypertension. Coreg 3.125 mg twice a day, lisinopril 20 mg daily. Monitor with increased mobility 9. Chronic systolic congestive heart failure. Monitor for any signs of fluid overload 10. Hyperlipidemia. Lipitor 11. BPH. Continue Flomax. Check PVR 3   Length of stay, days: 1  Walker Kehr , MD 02/25/2015, 8:14 AM

## 2015-02-26 ENCOUNTER — Inpatient Hospital Stay (HOSPITAL_COMMUNITY): Payer: Medicare PPO | Admitting: *Deleted

## 2015-02-26 DIAGNOSIS — E785 Hyperlipidemia, unspecified: Secondary | ICD-10-CM

## 2015-02-26 DIAGNOSIS — I119 Hypertensive heart disease without heart failure: Secondary | ICD-10-CM

## 2015-02-26 NOTE — Progress Notes (Signed)
Physical Therapy Session Note  Patient Details  Name: Parker [name] MRN: [id number] Date of Birth: 14-Sep-1927  Today's Date: 02/26/2015 PT Individual Time: 1325-1425 PT Individual Time Calculation (min): 60 min     Skilled Therapeutic Interventions/Progress Updates:  Patient in room sitting in a recliner agrees to therapy intervention, no complains of pain or discomfort. Patient transferred to therapy gym via w/c. BERG assessment performed ,with a final score of 29/56 indicating high risk for falls. NuStep 1 x 10 min to increase coordination and activity tolerance , patient cued to use LE only.Resistance on level 4 with continues monitoring to assure compliance and safety during exercise.  Gait training with no AD, HHA for maintaining balance and to assure safety, patient covered distance of 2 x 150 feet with min A and continues cues for posture and focus as he gets easily distracted and looses balance. Patient returned to room ,left sitting in recliner with all needs within reach.  Therapy Documentation Precautions:  Precautions Precautions: ICD/Pacemaker, Fall Precaution Comments: CHF, AFIB, heart block and Pacemaker-avoid lifting LUE past shoulder level, do not lift >10lb and no pushing/pulling LUE Restrictions Weight Bearing Restrictions: Yes LUE Weight Bearing: Touch down weight bearing Vital Signs: Therapy Vitals Temp: 98.2 F (36.8 C) Temp Source: Oral Pulse Rate: 70 Resp: 18 BP: 125/72 mmHg Patient Position (if appropriate): Sitting Oxygen Therapy SpO2: 97 % O2 Device: Not Delivered Pain: Pain Assessment Pain Assessment: No/denies pain  Balance: Berg Balance Test Sit to Stand: Able to stand  independently using hands Standing Unsupported: Able to stand 2 minutes with supervision Sitting with Back Unsupported but Feet Supported on Floor or Stool: Able to sit safely and securely 2 minutes Stand to Sit: Controls descent by using hands Transfers: Able to transfer  safely, definite need of hands Standing Unsupported with Eyes Closed: Unable to keep eyes closed 3 seconds but stays steady Standing Ubsupported with Feet Together: Able to place feet together independently but unable to hold for 30 seconds From Standing, Reach Forward with Outstretched Arm: Can reach forward >5 cm safely (2") From Standing Position, Pick up Object from Floor: Able to pick up shoe, needs supervision From Standing Position, Turn to Look Behind Over each Shoulder: Turn sideways only but maintains balance Turn 360 Degrees: Needs close supervision or verbal cueing Standing Unsupported, Alternately Place Feet on Step/Stool: Needs assistance to keep from falling or unable to try Standing Unsupported, One Foot in Front: Able to take small step independently and hold 30 seconds Standing on One Leg: Unable to try or needs assist to prevent fall Total Score: 29  See FIM for current functional status  Therapy/Group: Individual Therapy  [name] 02/26/2015, 3:39 PM

## 2015-02-26 NOTE — Progress Notes (Signed)
Parker [name] is a 81 y.o. male 1927/07/06 [id number]  Subjective: No new complaints. Slept well. Feeling OK.  Objective: Vital signs in last 24 hours: Temp:  [97.8 F (36.6 C)-98.4 F (36.9 C)] 97.8 F (36.6 C) (05/29 0530) Pulse Rate:  [65-70] 70 (05/29 0530) Resp:  [18] 18 (05/29 0530) BP: (126-132)/(68-76) 132/76 mmHg (05/29 0530) SpO2:  [95 %-97 %] 96 % (05/29 0530) Weight change:  Last BM Date: 02/24/15  Intake/Output from previous day: 05/28 0701 - 05/29 0700 In: 720 [P.O.:720] Out: 950 [Urine:950] Last cbgs: CBG (last 3)  No results for input(s): GLUCAP in the last 72 hours.   Physical Exam General: No apparent distress   HEENT: not dry Lungs: Normal effort. Lungs clear to auscultation, no crackles or wheezes. Cardiovascular: Regular rate and rhythm, no edema Abdomen: S/NT/ND; BS(+) Musculoskeletal:  unchanged Neurological: No new neurological deficits Wounds: N/A    Skin: clear  Aging changes Mental state: Alert, oriented, cooperative    Lab Results: BMET    Component Value Date/Time   NA 137 02/24/2015 0320   K 3.7 02/24/2015 0320   CL 101 02/24/2015 0320   CO2 25 02/24/2015 0320   GLUCOSE 105* 02/24/2015 0320   BUN 13 02/24/2015 0320   CREATININE 0.98 02/24/2015 0320   CALCIUM 8.7* 02/24/2015 0320   GFRNONAA >60 02/24/2015 0320   GFRAA >60 02/24/2015 0320   CBC    Component Value Date/Time   WBC 6.6 02/24/2015 0320   RBC 4.87 02/24/2015 0320   HGB 15.2 02/24/2015 0320   HCT 44.7 02/24/2015 0320   PLT 125* 02/24/2015 0320   MCV 91.8 02/24/2015 0320   MCH 31.2 02/24/2015 0320   MCHC 34.0 02/24/2015 0320   RDW 13.9 02/24/2015 0320   LYMPHSABS 0.7 02/20/2015 0512   MONOABS 0.6 02/20/2015 0512   EOSABS 0.0 02/20/2015 0512   BASOSABS 0.0 02/20/2015 0512    Studies/Results: No results found.  Medications: I have reviewed the patient's current medications.  Assessment/Plan:   1. Functional deficits secondary to right MCA infarct as  well as left occipital small infarct embolic secondary to atrial fibrillation 2. DVT Prophylaxis/Anticoagulation: SCDs. Monitor for any signs of DVT 3. Pain Management: Tylenol as needed 4. Complete heart block/atrial fibrillation/bradycardia. CRT pacemaker 02/22/2015 per Dr. Lovena Le. Cardiac rate controlled 5. Neuropsych: This patient is capable of making decisions on his own behalf. 6. Skin/Wound Care: Routine skin checks 7. Fluids/Electrolytes/Nutrition: Routine I&O with follow-up chemistries 8. Hypertension. Coreg 3.125 mg twice a day, lisinopril 20 mg daily. Monitor with increased mobility 9. Chronic systolic congestive heart failure. Monitor for any signs of fluid overload 10. Hyperlipidemia. Lipitor 11. BPH. Continue Flomax. Check PVR 3  Cont current Rx  Length of stay, days: 2  Walker Kehr , MD 02/26/2015, 8:33 AM

## 2015-02-27 ENCOUNTER — Inpatient Hospital Stay (HOSPITAL_COMMUNITY): Payer: Medicare PPO | Admitting: Physical Therapy

## 2015-02-27 ENCOUNTER — Inpatient Hospital Stay (HOSPITAL_COMMUNITY): Payer: Medicare PPO | Admitting: Occupational Therapy

## 2015-02-27 ENCOUNTER — Inpatient Hospital Stay (HOSPITAL_COMMUNITY): Payer: Medicare PPO | Admitting: Speech Pathology

## 2015-02-27 DIAGNOSIS — R414 Neurologic neglect syndrome: Secondary | ICD-10-CM

## 2015-02-27 DIAGNOSIS — I6931 Cognitive deficits following cerebral infarction: Secondary | ICD-10-CM

## 2015-02-27 LAB — CBC WITH DIFFERENTIAL/PLATELET
Basophils Absolute: 0 10*3/uL (ref 0.0–0.1)
Basophils Relative: 0 % (ref 0–1)
EOS ABS: 0.2 10*3/uL (ref 0.0–0.7)
EOS PCT: 3 % (ref 0–5)
HEMATOCRIT: 45.9 % (ref 39.0–52.0)
HEMOGLOBIN: 15.8 g/dL (ref 13.0–17.0)
LYMPHS ABS: 1.2 10*3/uL (ref 0.7–4.0)
LYMPHS PCT: 17 % (ref 12–46)
MCH: 31.4 pg (ref 26.0–34.0)
MCHC: 34.4 g/dL (ref 30.0–36.0)
MCV: 91.3 fL (ref 78.0–100.0)
MONO ABS: 0.6 10*3/uL (ref 0.1–1.0)
Monocytes Relative: 9 % (ref 3–12)
NEUTROS ABS: 5.1 10*3/uL (ref 1.7–7.7)
Neutrophils Relative %: 71 % (ref 43–77)
Platelets: 149 10*3/uL — ABNORMAL LOW (ref 150–400)
RBC: 5.03 MIL/uL (ref 4.22–5.81)
RDW: 13.8 % (ref 11.5–15.5)
WBC: 7.2 10*3/uL (ref 4.0–10.5)

## 2015-02-27 LAB — COMPREHENSIVE METABOLIC PANEL
ALK PHOS: 159 U/L — AB (ref 38–126)
ALT: 21 U/L (ref 17–63)
ANION GAP: 8 (ref 5–15)
AST: 40 U/L (ref 15–41)
Albumin: 3.2 g/dL — ABNORMAL LOW (ref 3.5–5.0)
BUN: 13 mg/dL (ref 6–20)
CHLORIDE: 100 mmol/L — AB (ref 101–111)
CO2: 29 mmol/L (ref 22–32)
Calcium: 8.9 mg/dL (ref 8.9–10.3)
Creatinine, Ser: 0.96 mg/dL (ref 0.61–1.24)
GFR calc non Af Amer: >60 mL/min (ref >60)
GLUCOSE: 125 mg/dL — AB (ref 65–99)
POTASSIUM: 3.8 mmol/L (ref 3.5–5.1)
Sodium: 137 mmol/L (ref 135–145)
Total Bilirubin: 1.1 mg/dL (ref 0.3–1.2)
Total Protein: 6.6 g/dL (ref 6.5–8.1)

## 2015-02-27 NOTE — Progress Notes (Signed)
81 y.o. right handed male with history of CAD with stenting, hypertension, chronic systolic congestive heart failure, atrial fibrillation maintained on aspirin. Independent and  active prior to admission. He presented 02/19/2015 with left-sided weakness altered mental status and right gaze preference. 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 Patient did receive TPA. MRI and MRA showed extensive area of acute infarct right middle cerebral artery territory as well as left occipital small infarct. Echocardiogram with ejection fraction of 50% grade 2 diastolic dysfunction. Findings of occluded right MCA M1 segment underwent complete revascularization for interventional radiology  Subjective/Complaints: Slept well.  I am in "heart attack section of hospital", RN notes confusion at noc No pain c/os  ROS:  No CP or SOB      Objective: Vital Signs: Blood pressure 134/79, pulse 121, temperature 98.3 F (36.8 C), temperature source Oral, resp. rate 18, height 6\' 1"  (1.854 m), weight 86.9 kg (191 lb 9.3 oz), SpO2 97 %. No results found. No results found for this or any previous visit (from the past 72 hour(s)).   HEENT: normal Cardio: RRR and no murmurs Resp: CTA B/L and unlabored GI: BS positive and NT, ND Extremity:  Pulses positive and No Edema Skin:   Intact and Wound C/D/I and Pacemaker site Neuro: Alert/Oriented, Abnormal Sensory reduced on left side, Abnormal FMC Ataxic/ dec FMC and Inattention Musc/Skel:  Other no painb with UE or LE ROM Gen NAD   Assessment/Plan: 1. Functional deficits secondary to RIght MCA embolic intake which require 3+ hours per day of interdisciplinary therapy in a comprehensive inpatient rehab setting. Physiatrist is providing close team supervision and 24 hour management of active medical problems listed below. Physiatrist and rehab team continue to assess barriers to discharge/monitor patient progress toward functional and  medical goals. FIM: FIM - Bathing Bathing Steps Patient Completed: Right Arm, Chest, Left Arm, Abdomen, Front perineal area, Buttocks, Right upper leg, Left upper leg, Right lower leg (including foot), Left lower leg (including foot) Bathing: 1: Two helpers  FIM - Upper Body Dressing/Undressing Upper body dressing/undressing steps patient completed: Pull shirt over trunk, Thread/unthread right sleeve of pullover shirt/dresss, Thread/unthread left sleeve of pullover shirt/dress, Pull shirt around back of front closure shirt/dress Upper body dressing/undressing: 1: Two helpers FIM - Lower Body Dressing/Undressing Lower body dressing/undressing steps patient completed: Thread/unthread right underwear leg, Thread/unthread left underwear leg, Pull underwear up/down, Thread/unthread right pants leg, Pull pants up/down, Thread/unthread left pants leg, Don/Doff left sock, Don/Doff right sock Lower body dressing/undressing: 1: Two helpers  FIM - Toileting Toileting steps completed by patient: Performs perineal hygiene Toileting Assistive Devices: Grab bar or rail for support Toileting: 3: Mod-Patient completed 2 of 3 steps  FIM - Radio producer Devices: Product manager Transfers: 0-Activity did not occur  FIM - Control and instrumentation engineer Devices: Arm rests Bed/Chair Transfer: 3: Supine > Sit: Mod A (lifting assist/Pt. 50-74%/lift 2 legs, 4: Sit > Supine: Min A (steadying pt. > 75%/lift 1 leg), 3: Bed > Chair or W/C: Mod A (lift or lower assist), 3: Chair or W/C > Bed: Mod A (lift or lower assist)  FIM - Locomotion: Wheelchair Distance: 150 Locomotion: Wheelchair: 1: Total Assistance/staff pushes wheelchair (Pt<25%) FIM - Locomotion: Ambulation Locomotion: Ambulation Assistive Devices: Other (comment) (HHA) Ambulation/Gait Assistance: 3: Mod assist Locomotion: Ambulation: 1: Travels less than 50 ft with moderate assistance (Pt: 50 -  74%)  Comprehension Comprehension Mode: Auditory Comprehension: 5-Understands basic 90% of the  time/requires cueing < 10% of the time  Expression Expression Mode: Verbal Expression: 4-Expresses basic 75 - 89% of the time/requires cueing 10 - 24% of the time. Needs helper to occlude trach/needs to repeat words.  Social Interaction Social Interaction: 4-Interacts appropriately 75 - 89% of the time - Needs redirection for appropriate language or to initiate interaction.  Problem Solving Problem Solving: 2-Solves basic 25 - 49% of the time - needs direction more than half the time to initiate, plan or complete simple activities  Memory Memory: 2-Recognizes or recalls 25 - 49% of the time/requires cueing 51 - 75% of the time  Medical Problem List and Plan: 1. Functional deficits secondary to right MCA infarct as well as left occipital small infarct embolic secondary to atrial fibrillation 2.  DVT Prophylaxis/Anticoagulation: SCDs. Monitor for any signs of DVT 3. Pain Management: Tylenol as needed 4. Complete heart block/atrial fibrillation/bradycardia. CRT pacemaker 02/22/2015 per Dr. Lovena Le. Cardiac rate controlled 5. Neuropsych: This patient is capable of making decisions on his own behalf. 6. Skin/Wound Care: Routine skin checks 7. Fluids/Electrolytes/Nutrition: Routine I&O with follow-up chemistries 8. Hypertension. Coreg 3.125 mg twice a day, lisinopril 20 mg daily. Monitor with increased mobility 9. Chronic systolic congestive heart failure. Monitor for any signs of fluid overload 10. Hyperlipidemia. Lipitor 11. BPH. Continue Flomax. Check PVR 3   LOS (Days) 3 A FACE TO FACE EVALUATION WAS PERFORMED  Tori Dattilio E 02/27/2015, 6:56 AM

## 2015-02-27 NOTE — Evaluation (Signed)
Speech Language Pathology Bedside Swallowing Assessment and Plan  Patient Details  Name: Parker [name] MRN: [id number] Date of Birth: 1927-01-03  SLP Diagnosis: Dysphagia;Cognitive Impairments  Rehab Potential: Good ELOS: 14-21 days   Today's Date: 02/27/2015 SLP Individual Time: 0800-0900 SLP Individual Time Calculation (min): 60 min   Problem List:  Patient Active Problem List   Diagnosis Date Noted  . Acute urinary retention 02/23/2015  . Atrial fibrillation 02/23/2015  . Benign hypertension 02/23/2015  . Hyperlipidemia LDL goal <70 02/23/2015  . Leukocytosis 02/23/2015  . Urinary retention 02/23/2015  . Pacemaker   . Stroke with cerebral ischemia   . Acute right MCA stroke 02/22/2015  . Acute left hemiparesis 02/22/2015  . Hemi-neglect of left side 02/22/2015  . Left homonymous hemianopsia 02/22/2015  . Anosognosia 02/22/2015  . Complete heart block   . Chronic systolic heart failure   . 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 Cardiomyopathy 02/24/2014   Past Medical History:  Past Medical History  Diagnosis Date  . MI (myocardial infarction)   . Hypertension   . Hyperlipidemia   . Bradycardia     per girlfriend, has refused pacemaker   . Chronic systolic heart failure     35%-45%,  2013  . Head and neck cancer     surgical resection, no XRT  . Atrial fibrillation    Past Surgical History:  Past Surgical History  Procedure Laterality Date  . Back surgery    . Radiology with anesthesia N/A 02/19/2015    Procedure: RADIOLOGY WITH ANESTHESIA;  Surgeon: Luanne Bras, MD;  Location: [facility];  Service: Radiology;  Laterality: N/A;  . Ep implantable device N/A 02/22/2015    Procedure: BiV Pacemaker Insertion CRT-P;  Surgeon: Evans Lance, MD;  Location: [facility];  Service: Cardiovascular;  Laterality: N/A;    Assessment / Plan / Recommendation Clinical Impression  Pt presents with a mild dysphagia  characterized by decreased labial seal, weakened mastication and oral manipulation of solids, and prolonged oral transit of materials due to left sided labial, lingual, and buccal weakness.  The abovementioned deficits resulted in mild anterior loss of residual solids and mild left sided buccal residue left in the oral cavity post swallow.  Pt also presents with suspected delayed swallow initiation which resulted in delayed throat clear x2 over the course of an entire meal.  Recommend that pt remain on a soft diet and thin liquids with intermittent supervision for use of swallowing precautions to manage oral residue.    Skilled Therapeutic Interventions          Bedside swallowing evaluation completed with results and recommendations reviewed with Patient.   Pt required min assist verbal cues to recall 3 swallowing precautions after a ~30 second delay.  Pt also required increased time and min verbal cues for redirection to task to complete his meal due to environmental distractions and tangential verbal output.  SLP will continue to follow up to reinforce use of precautions.     SLP Assessment  Patient will need skilled Speech Lanaguage Pathology Services during CIR admission    Recommendations  SLP Diet Recommendations: Dysphagia 3 (Mech soft);Thin Liquid Administration via: Cup;Straw Medication Administration: Whole meds with puree Supervision: Patient able to self feed;Intermittent supervision to cue for compensatory strategies Compensations: Check for pocketing;Small sips/bites;Minimize environmental distractions Postural Changes and/or Swallow Maneuvers: Seated upright 90 degrees;Out of bed for meals Oral Care Recommendations: Oral care BID Patient destination: Home  Follow up Recommendations: Home Health SLP;Outpatient SLP;24 hour supervision/assistance Equipment Recommended: None recommended by SLP    SLP Frequency 3 to 5 out of 7 days   SLP Treatment/Interventions Cognitive  remediation/compensation;Cueing hierarchy;Functional tasks;Environmental controls;Internal/external aids;Patient/family education;Speech/Language facilitation;Dysphagia/aspiration precaution training    Pain Pain Assessment Pain Assessment: No/denies pain Prior Functioning Cognitive/Linguistic Baseline: Baseline deficits Baseline deficit details: memory deficits at baseline, per report Type of Home: House  Lives With: Significant other (split time between his home and his girfriend [name]'s home ) Available Help at Discharge: Family;Friend(s);Available 24 hours/day Vocation: Retired  Industrial/product designer Term Goals: Week 1: SLP Short Term Goal 1 (Week 1): Pt will identify 2 physical deficits and 2 cognitive deficits with max A given multimodal cueing. SLP Short Term Goal 2 (Week 1): Pt will recall 2 new pieces of information during basic functional tasks with mod A given verbal/visual cueing SLP Short Term Goal 3 (Week 1): Pt will complete basic problem solving tasks during functional tasks with mod-max A given multimodal cueing. SLP Short Term Goal 4 (Week 1): Pt will consume his currently prescribed diet wtih mod I use of swallowing precautions to monitor and correct left sided buccal residue.   See FIM for current functional status Refer to Care Plan for Long Term Goals  Recommendations for other services: None  Discharge Criteria: Patient will be discharged from SLP if patient refuses treatment 3 consecutive times without medical reason, if treatment goals not met, if there is a change in medical status, if patient makes no progress towards goals or if patient is discharged from hospital.  The above assessment, treatment plan, treatment alternatives and goals were discussed and mutually agreed upon: by patient  Emilio Math 02/27/2015, 8:52 AM

## 2015-02-27 NOTE — Progress Notes (Signed)
Physical Therapy Session Note  Patient Details  Name: Parker [name] MRN: [id number] Date of Birth: 1927/02/21  Today's Date: 02/27/2015 PT Individual Time: 0900-1000 PT Individual Time Calculation (min): 60 min   Short Term Goals: Week 1:  PT Short Term Goal 1 (Week 1): Pt will perform bed mobility with min A and min verbal cues for sequencing PT Short Term Goal 2 (Week 1): Pt will perform bed <> chair transfers with min A and min verbal cues for sequencing PT Short Term Goal 3 (Week 1): Pt will perform ambulation x 50' with LRAD and min A  PT Short Term Goal 4 (Week 1): Pt will ascend/descend 8 stairs with two rails with min A  PT Short Term Goal 5 (Week 1): Pt will demonstrated increased intellectual awareness of deficits during functional mobility with mod cues  Skilled Therapeutic Interventions/Progress Updates:   Session focused on maintaining precautions with functional mobility, sit <> stand transfers, gait, stairs, L NMR, and activity tolerance. Patient transferred supine > sit with HOB flat with verbal cues for technique and mod A to elevate trunk from bed due to LUE precautions. Patient ambulated to/from bathroom and performed toileting tasks with min guard. Patient required cues for sequencing hand hygiene. Patient with soiled undergarments, therefore performed multiple sit <> stand transfers from bed to change pants with therapist donning/doffing shoes total A. Gait training without device with min guard x 50 ft + 100 ft + 150 ft, verbal cues for "longer steps" to facilitate more fluid gait pattern due to short, choppy steps. When cued to take "bigger" steps, patient performed steppage gait. Patient performed simulated car transfer without device with min guard, verbal cues for sequencing. Patient negotiated up/down 4 stairs using R rail with L HHA to prevent LUE WB. Patient required max multimodal cues for maintaining pacemaker LUE precautions throughout session, with therapist  attempting to provide L HHA to decrease WB through LUE with transfers, stairs, and sitting edge of mat. Performed NuStep using BLE only at level 4 x 6 min for LLE NMR and activity tolerance. Patient oriented to place and situation but with decreased awareness of deficits, requiring max assist for intellectual awareness of cognitive/physical deficits. Patient left semi reclined in bed with all needs within reach, bed alarm on.    Therapy Documentation Precautions:  Precautions Precautions: ICD/Pacemaker, Fall Precaution Comments: CHF, AFIB, heart block and Pacemaker-avoid lifting LUE past shoulder level, do not lift >10lb and no pushing/pulling LUE Restrictions Weight Bearing Restrictions: Yes LUE Weight Bearing:  (no pushing, pulling or lifting LUE) Vital Signs: Therapy Vitals Pulse Rate: 70 BP: 114/78 mmHg Patient Position (if appropriate): Sitting Oxygen Therapy SpO2: 95 % O2 Device: Not Delivered Pain: Inconsistent LUE pain, when asked to rate on scale of 1-10 with 10 being the worst, patient stated 8.5/10 but then stated pain was "not bad at all"  See FIM for current functional status  Therapy/Group: Individual Therapy  Laretta Alstrom 02/27/2015, 10:05 AM

## 2015-02-27 NOTE — Progress Notes (Signed)
Patient information reviewed and entered into eRehab system by Yago Ludvigsen, RN, CRRN, PPS Coordinator.  Information including medical coding and functional independence measure will be reviewed and updated through discharge.     Per nursing patient was given "Data Collection Information Summary for Patients in Inpatient Rehabilitation Facilities with attached "Privacy Act Statement-Health Care Records" upon admission.  

## 2015-02-27 NOTE — IPOC Note (Signed)
Overall Plan of Care [facility]) Patient Details Name: Parker [name] MRN: [id number] DOB: 09/13/1927  Admitting Diagnosis: [place] CVA   Hospital Problems: Principal Problem:   Acute right MCA stroke Active Problems:   Hemi-neglect of left side   Left homonymous hemianopsia     Functional Problem List: Nursing Bowel, Endurance, Medication Management, Pain, Perception, Safety  PT Balance, Endurance, Motor, Pain, Perception, Safety, Sensory  OT    SLP Behavior, Cognition, Safety  TR         Basic ADL's: OT       Advanced  ADL's: OT       Transfers: PT Bed Mobility, Bed to Chair, Car, Furniture  OT       Locomotion: PT Ambulation, Stairs     Additional Impairments: OT    SLP Swallowing      TR      Anticipated Outcomes Item Anticipated Outcome  Self Feeding independent  Swallowing  To be determined   Basic self-care  supervision  Toileting  supervision   Bathroom Transfers supervision  Bowel/Bladder  manage bowel and bladder min assist  Transfers  Supervision  Locomotion  Supervision  Communication     Cognition  Pt will demonstrate short term memory with supervision given visual external memory aids, min A with safety awareness and basic problem solving skills.  Pain  3 or less  Safety/Judgment  minimal assist   Therapy Plan: PT Intensity: Minimum of 1-2 x/day ,45 to 90 minutes PT Frequency: 5 out of 7 days PT Duration Estimated Length of Stay: 12-14 days OT Intensity: Minimum of 1-2 x/day, 45 to 90 minutes OT Frequency: 5 out of 7 days OT Duration/Estimated Length of Stay: 10-14 days SLP Intensity: Minumum of 1-2 x/day, 30 to 90 minutes SLP Frequency: 3 to 5 out of 7 days SLP Duration/Estimated Length of Stay: 14-21 days       Team Interventions: Nursing Interventions Patient/Family Education, Bowel Management, Disease Management/Prevention, Pain Management, Medication Management, Cognitive Remediation/Compensation, Discharge Planning  PT  interventions Ambulation/gait training, Balance/vestibular training, Cognitive remediation/compensation, Community reintegration, Discharge planning, Disease management/prevention, DME/adaptive equipment instruction, Functional mobility training, Neuromuscular re-education, Pain management, Patient/family education, Psychosocial support, Splinting/orthotics, Stair training, Therapeutic Activities, Therapeutic Exercise, UE/LE Strength taining/ROM, Visual/perceptual remediation/compensation  OT Interventions Balance/vestibular training, DME/adaptive equipment instruction  SLP Interventions Cognitive remediation/compensation, Cueing hierarchy, Functional tasks, Environmental controls, Internal/external aids, Medication managment, Patient/family education, Speech/Language facilitation  TR Interventions    SW/CM Interventions      Team Discharge Planning: Destination: PT-Home ,OT- Home , SLP-Home Projected Follow-up: PT-Home health PT, 24 hour supervision/assistance, OT-  Home health OT, SLP-Home Health SLP, Outpatient SLP Projected Equipment Needs: PT-To be determined, Wheelchair (measurements), Wheelchair cushion (measurements), OT- Tub/shower bench, SLP-None recommended by SLP Equipment Details: PT-AD for ambulation TBD as pt progresses, OT-  Patient/family involved in discharge planning: PT- Patient, Family Midwife,  OT-Patient, Family member/caregiver, SLP-   MD ELOS: 10-14d Medical Rehab Prognosis:  Good Assessment:   81 y.o. right handed male with history of CAD with stenting, hypertension, chronic systolic congestive heart failure, atrial fibrillation maintained on aspirin. Independent and  active prior to admission. He presented 02/19/2015 with left-sided weakness altered mental status and right gaze preference. 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 Patient did receive TPA. MRI and MRA showed extensive area of acute infarct right middle  cerebral artery territory as well as left occipital small infarct. Echocardiogram with ejection fraction of 35% grade 2 diastolic dysfunction.  Findings of occluded right MCA M1 segment underwent complete revascularization for interventional radiology  Now requiring 24/7 Rehab RN,MD, as well as CIR level PT, OT and SLP.  Treatment team will focus on ADLs and mobility with goals set at Sup  See Team Conference Notes for weekly updates to the plan of care

## 2015-02-27 NOTE — Progress Notes (Signed)
Physical Therapy Session Note  Patient Details  Name: Parker [name] MRN: [id number] Date of Birth: 1927/01/17  Today's Date: 02/27/2015 PT Individual Time: 1330-1355 PT Individual Time Calculation (min): 25 min   Short Term Goals: Week 1:  PT Short Term Goal 1 (Week 1): Pt will perform bed mobility with min A and min verbal cues for sequencing PT Short Term Goal 2 (Week 1): Pt will perform bed <> chair transfers with min A and min verbal cues for sequencing PT Short Term Goal 3 (Week 1): Pt will perform ambulation x 50' with LRAD and min A  PT Short Term Goal 4 (Week 1): Pt will ascend/descend 8 stairs with two rails with min A  PT Short Term Goal 5 (Week 1): Pt will demonstrated increased intellectual awareness of deficits during functional mobility with mod cues  Skilled Therapeutic Interventions/Progress Updates:   Session focused on gait mechanics and safety, adhering to precautions with transfers, NMR, and activity tolerance. SO [name] present for session. Patient ambulated throughout rehab unit without device with min guard, max cues for "longer steps" with improved B step length facilitating more fluid gait pattern and increased gait speed. Patient performed sit <> stand transfers from 21" mat without UE support x 5 with rest as needed, verbal/visual cues for technique. Stair training up/down 8 steps using R rail with max cues to not utilize LUE with resultant L HHA due to inability to negotiate stairs without reaching for L rail. Patient negotiated obstacle course to increase step length and challenge balance consisting of negotiating thresholds and weaving between cones with min guard overall. Patient requested to use bathroom. Returned to room and patient left sitting on toilet with SO in room and NT notified of patient position.  Therapy Documentation Precautions:  Precautions Precautions: ICD/Pacemaker, Fall Precaution Comments: CHF, AFIB, heart block and Pacemaker-avoid lifting LUE  past shoulder level, do not lift >10lb and no pushing/pulling LUE Restrictions Weight Bearing Restrictions: Yes LUE Weight Bearing:  (no pushing, pulling or lifting LUE) Pain: Pain Assessment Pain Assessment: No/denies pain Locomotion : Ambulation Ambulation/Gait Assistance: 4: Min guard   See FIM for current functional status  Therapy/Group: Individual Therapy  Laretta Alstrom 02/27/2015, 2:01 PM

## 2015-02-27 NOTE — Progress Notes (Signed)
Occupational Therapy Session Note  Patient Details  Name: Parker [name] MRN: [id number] Date of Birth: [birth year]  Today's Date: 02/27/2015 OT Individual Time: 1103-1205 OT Individual Time Calculation (min): 62 min    Short Term Goals: Week 1:  OT Short Term Goal 1 (Week 1): Pt. will groom self with supervision OT Short Term Goal 2 (Week 1): Pt. will bathe self with supervision OT Short Term Goal 3 (Week 1): Pt. will dress selft with minimal assist OT Short Term Goal 4 (Week 1): Pt. will transfer to toilet with minimal asssit OT Short Term Goal 5 (Week 1): Pt. will transfer to tub/shower with minimal assist  Skilled Therapeutic Interventions/Progress Updates:    Pt seen for ADL session.  He currently needs max instructional step by step cueing to complete bathing tasks.  He was able to complete dressing with mod instructional cueing as well.  Decreased attention to the LUE but uses it spontaneously.  On one occasion he did not notice that the soap bottle he was holding in the left hand was upside down and he proceeded to spill soap on the floor without any idea this was happening.  He required multiple cues to understand directions therapist gave him at the beginning of the session, which was to get his clothing, washcloth, and towel together so he could go over to the sink to bathe.  Instead he walked into the bathroom and sat down on the toilet.  Pt's significant other came in at end of session while he was dressing.  She was able to observe some of the difficulty he was demonstrating with sequencing and completing dressing tasks.  Pt left in recliner with safety belt in place.    Therapy Documentation Precautions:  Precautions Precautions: ICD/Pacemaker, Fall Precaution Comments: CHF, AFIB, heart block and Pacemaker-avoid lifting LUE past shoulder level, do not lift >10lb and no pushing/pulling LUE Restrictions Weight Bearing Restrictions: Yes LUE Weight Bearing:  (no pushing, pulling  or lifting LUE)  Pain: Pain Assessment Pain Assessment: No/denies pain ADL: See FIM for current functional status  Therapy/Group: Individual Therapy  Marily Konczal OTR/L 02/27/2015, 12:54 PM

## 2015-02-28 ENCOUNTER — Inpatient Hospital Stay (HOSPITAL_COMMUNITY): Payer: Medicare PPO | Admitting: Occupational Therapy

## 2015-02-28 ENCOUNTER — Inpatient Hospital Stay (HOSPITAL_COMMUNITY): Payer: Medicare PPO

## 2015-02-28 ENCOUNTER — Inpatient Hospital Stay (HOSPITAL_COMMUNITY): Payer: Medicare PPO | Admitting: Speech Pathology

## 2015-02-28 DIAGNOSIS — H53462 Homonymous bilateral field defects, left side: Secondary | ICD-10-CM

## 2015-02-28 DIAGNOSIS — I482 Chronic atrial fibrillation: Secondary | ICD-10-CM

## 2015-02-28 NOTE — Care Management Note (Signed)
Inpatient [facility] Individual Statement of Services  Patient Name:  Parker [name]  Date:  02/28/2015  Welcome to the [facility].  Our goal is to provide you with an individualized program based on your diagnosis and situation, designed to meet your specific needs.  With this comprehensive rehabilitation program, you will be expected to participate in at least 3 hours of rehabilitation therapies Monday-Friday, with modified therapy programming on the weekends.  Your rehabilitation program will include the following services:  Physical Therapy (PT), Occupational Therapy (OT), Speech Therapy (ST), 24 hour per day rehabilitation nursing, Therapeutic Recreaction (TR), Case Management (Social Worker), Rehabilitation Medicine, Nutrition Services and Pharmacy Services  Weekly team conferences will be held on Wednesday to discuss your progress.  Your Social Worker will talk with you frequently to get your input and to update you on team discussions.  Team conferences with you and your family in attendance may also be held.  Expected length of stay: 12-14 days   Overall anticipated outcome: supervision/some min level  Depending on your progress and recovery, your program may change. Your Social Worker will coordinate services and will keep you informed of any changes. Your Social Worker's name and contact numbers are listed  below.  The following services may also be recommended but are not provided by the [facility] will be made to provide these services after discharge if needed.  Arrangements include referral to agencies that provide these services.  Your insurance has been verified to be:  [employer] Your primary doctor is:    Pertinent information will be shared with your doctor and your insurance company.  Social Worker:   Ovidio Kin, [place] or ([phone]  Information discussed with and copy given to patient by: Elease Hashimoto, 02/28/2015, 10:18 AM

## 2015-02-28 NOTE — Progress Notes (Signed)
81 y.o. right handed male with history of CAD with stenting, hypertension, chronic systolic congestive heart failure, atrial fibrillation maintained on aspirin. Independent and  active prior to admission. He presented 02/19/2015 with left-sided weakness altered mental status and right gaze preference. 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 Patient did receive TPA. MRI and MRA showed extensive area of acute infarct right middle cerebral artery territory as well as left occipital small infarct. Echocardiogram with ejection fraction of 06% grade 2 diastolic dysfunction. Findings of occluded right MCA M1 segment underwent complete revascularization for interventional radiology  Subjective/Complaints: "they were nice to me in therapy"  Practiced steps with PT  ROS:  No CP or SOB , no bleeding from PPM site        Objective: Vital Signs: Blood pressure 138/84, pulse 70, temperature 97.7 F (36.5 C), temperature source Oral, resp. rate 18, height _0  (1.854 m), weight 86.9 kg (191 lb 9.3 oz), SpO2 98 %. No results found. Results for orders placed or performed during the hospital encounter of 02/24/15 (from the past 72 hour(s))  CBC WITH DIFFERENTIAL     Status: Abnormal   Collection Time: 02/27/15  8:30 AM  Result Value Ref Range   WBC 7.2 4.0 - 10.5 K/uL   RBC 5.03 4.22 - 5.81 MIL/uL   Hemoglobin 15.8 13.0 - 17.0 g/dL   HCT 45.9 39.0 - 52.0 %   MCV 91.3 78.0 - 100.0 fL   MCH 31.4 26.0 - 34.0 pg   MCHC 34.4 30.0 - 36.0 g/dL   RDW 13.8 11.5 - 15.5 %   Platelets 149 (L) 150 - 400 K/uL   Neutrophils Relative % 71 43 - 77 %   Neutro Abs 5.1 1.7 - 7.7 K/uL   Lymphocytes Relative 17 12 - 46 %   Lymphs Abs 1.2 0.7 - 4.0 K/uL   Monocytes Relative 9 3 - 12 %   Monocytes Absolute 0.6 0.1 - 1.0 K/uL   Eosinophils Relative 3 0 - 5 %   Eosinophils Absolute 0.2 0.0 - 0.7 K/uL   Basophils Relative 0 0 - 1 %   Basophils Absolute 0.0 0.0 - 0.1 K/uL   Comprehensive metabolic panel     Status: Abnormal   Collection Time: 02/27/15  8:30 AM  Result Value Ref Range   Sodium 137 135 - 145 mmol/L   Potassium 3.8 3.5 - 5.1 mmol/L   Chloride 100 (L) 101 - 111 mmol/L   CO2 29 22 - 32 mmol/L   Glucose, Bld 125 (H) 65 - 99 mg/dL   BUN 13 6 - 20 mg/dL   Creatinine, Ser 0.96 0.61 - 1.24 mg/dL   Calcium 8.9 8.9 - 10.3 mg/dL   Total Protein 6.6 6.5 - 8.1 g/dL   Albumin 3.2 (L) 3.5 - 5.0 g/dL   AST 40 15 - 41 U/L   ALT 21 17 - 63 U/L   Alkaline Phosphatase 159 (H) 38 - 126 U/L   Total Bilirubin 1.1 0.3 - 1.2 mg/dL   GFR calc non Af Amer >60 >60 mL/min   GFR calc Af Amer >60 >60 mL/min    Comment: (NOTE) The eGFR has been calculated using the CKD EPI equation. This calculation has not been validated in all clinical situations. eGFR's persistently <60 mL/min signify possible Chronic Kidney Disease.    Anion gap 8 5 - 15     HEENT: normal Cardio: RRR and no murmurs Resp: CTA B/L and unlabored  GI: BS positive and NT, ND Extremity:  Pulses positive and No Edema Skin:   Intact and Wound C/D/I and Pacemaker site Neuro: Alert/Oriented, Abnormal Sensory reduced on left side, Abnormal FMC Ataxic/ dec FMC and Inattention Musc/Skel:  Other no painb with UE or LE ROM Gen NAD   Assessment/Plan: 1. Functional deficits secondary to RIght MCA embolic intake which require 3+ hours per day of interdisciplinary therapy in a comprehensive inpatient rehab setting. Physiatrist is providing close team supervision and 24 hour management of active medical problems listed below. Physiatrist and rehab team continue to assess barriers to discharge/monitor patient progress toward functional and medical goals. Team conf in am FIM: FIM - Bathing Bathing Steps Patient Completed: Right Arm, Chest, Left Arm, Abdomen, Front perineal area, Buttocks, Right upper leg, Left upper leg, Right lower leg (including foot), Left lower leg (including foot) Bathing: 5:  Supervision: Safety issues/verbal cues  FIM - Upper Body Dressing/Undressing Upper body dressing/undressing steps patient completed: Pull shirt over trunk, Thread/unthread right sleeve of pullover shirt/dresss, Thread/unthread left sleeve of pullover shirt/dress, Put head through opening of pull over shirt/dress Upper body dressing/undressing: 5: Supervision: Safety issues/verbal cues FIM - Lower Body Dressing/Undressing Lower body dressing/undressing steps patient completed: Thread/unthread right underwear leg, Thread/unthread left underwear leg, Pull underwear up/down, Thread/unthread right pants leg, Pull pants up/down, Thread/unthread left pants leg, Don/Doff left sock, Don/Doff right sock, Fasten/unfasten right shoe, Don/Doff left shoe, Don/Doff right shoe, Fasten/unfasten left shoe Lower body dressing/undressing: 4: Min-Patient completed 75 plus % of tasks  FIM - Toileting Toileting steps completed by patient: Adjust clothing prior to toileting, Performs perineal hygiene, Adjust clothing after toileting Toileting Assistive Devices: Grab bar or rail for support Toileting: 4: Steadying assist  FIM - Radio producer Devices: Elevated toilet seat, Grab bars Toilet Transfers: 4-To toilet/BSC: Min A (steadying Pt. > 75%), 4-From toilet/BSC: Min A (steadying Pt. > 75%)  FIM - Bed/Chair Transfer Bed/Chair Transfer Assistive Devices: Arm rests Bed/Chair Transfer: 5: Supine > Sit: Supervision (verbal cues/safety issues), 4: Bed > Chair or W/C: Min A (steadying Pt. > 75%)  FIM - Locomotion: Wheelchair Distance: 150 Locomotion: Wheelchair: 0: Activity did not occur FIM - Locomotion: Ambulation Locomotion: Ambulation Assistive Devices: Other (comment) (none) Ambulation/Gait Assistance: 4: Min guard Locomotion: Ambulation: 4: Travels 150 ft or more with minimal assistance (Pt.>75%)  Comprehension Comprehension Mode: Auditory Comprehension: 5-Understands basic 90%  of the time/requires cueing < 10% of the time  Expression Expression Mode: Verbal Expression: 5-Expresses basic needs/ideas: With extra time/assistive device  Social Interaction Social Interaction: 4-Interacts appropriately 75 - 89% of the time - Needs redirection for appropriate language or to initiate interaction.  Problem Solving Problem Solving: 3-Solves basic 50 - 74% of the time/requires cueing 25 - 49% of the time  Memory Memory: 3-Recognizes or recalls 50 - 74% of the time/requires cueing 25 - 49% of the time  Medical Problem List and Plan: 1. Functional deficits secondary to right MCA infarct as well as left occipital small infarct embolic secondary to atrial fibrillation 2.  DVT Prophylaxis/Anticoagulation: SCDs. Monitor for any signs of DVT 3. Pain Management: Tylenol as needed 4. Complete heart block/atrial fibrillation/bradycardia. CRT pacemaker 02/22/2015 per Dr. Lovena Le. Cardiac rate controlled 5. Neuropsych: This patient is capable of making decisions on his own behalf. 6. Skin/Wound Care: Routine skin checks 7. Fluids/Electrolytes/Nutrition: Routine I&O with follow-up chemistries 8. Hypertension. Coreg 3.125 mg twice a day, lisinopril 20 mg daily. Monitor with increased mobility 9. Chronic systolic congestive heart  failure. Monitor for any signs of fluid overload 10. Hyperlipidemia. Lipitor 11. BPH. Continue Flomax. Check PVR 3   LOS (Days) 4 A FACE TO FACE EVALUATION WAS PERFORMED  Leveta Wahab E 02/28/2015, 6:44 AM

## 2015-02-28 NOTE — Progress Notes (Signed)
Social Work Assessment and Plan Social Work Assessment and Plan  Patient Details  Name: Parker [name] MRN: [id number] Date of Birth: 08-24-1927  Today's Date: 02/28/2015  Problem List:  Patient Active Problem List   Diagnosis Date Noted  . Acute urinary retention 02/23/2015  . Atrial fibrillation 02/23/2015  . Benign hypertension 02/23/2015  . Hyperlipidemia LDL goal <70 02/23/2015  . Leukocytosis 02/23/2015  . Urinary retention 02/23/2015  . Pacemaker   . Stroke with cerebral ischemia   . Acute right MCA stroke 02/22/2015  . Acute left hemiparesis 02/22/2015  . Hemi-neglect of left side 02/22/2015  . Left homonymous hemianopsia 02/22/2015  . Anosognosia 02/22/2015  . Complete heart block   . Chronic systolic heart failure   . 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 Cardiomyopathy 02/24/2014   Past Medical History:  Past Medical History  Diagnosis Date  . MI (myocardial infarction)   . Hypertension   . Hyperlipidemia   . Bradycardia     per girlfriend, has refused pacemaker   . Chronic systolic heart failure     35%-45%,  2013  . Head and neck cancer     surgical resection, no XRT  . Atrial fibrillation    Past Surgical History:  Past Surgical History  Procedure Laterality Date  . Back surgery    . Radiology with anesthesia N/A 02/19/2015    Procedure: RADIOLOGY WITH ANESTHESIA;  Surgeon: Luanne Bras, MD;  Location: [facility];  Service: Radiology;  Laterality: N/A;  . Ep implantable device N/A 02/22/2015    Procedure: BiV Pacemaker Insertion CRT-P;  Surgeon: Evans Lance, MD;  Location: [facility];  Service: Cardiovascular;  Laterality: N/A;   Social History:  reports that he has quit smoking. He does not have any smokeless tobacco history on file. He reports that he does not drink alcohol or use illicit drugs.  Family / Support Systems Marital Status: Widow/Widower Patient Roles: Partner,  Parent Spouse/Significant Other: [name]-cell   Children: [name] Capps-daughter [phone]-home  [phone]-cell Other Supports: Tammy James-daughter  [phone]-cell Anticipated Caregiver: peggy and daughter's Ability/Limitations of Caregiver: Girlfriend has been providing care prior to admission-not physical though Caregiver Availability: 24/7 Family Dynamics: Close knit family who are local and very supportive of one another. Daughter's will assist with what they can.  He and Solicitor a good relationship can depende upon one another.  Social History Preferred language: English Religion: None Cultural Background: No issues Education: High School Read: Yes Write: Yes Employment Status: Retired Freight forwarder Issues: No issues Guardian/Conservator: None-according to MD pt is capable of making his own decisions while here.  Will make sure a family member is here if any decisions need to be made.   Abuse/Neglect Physical Abuse: Denies Verbal Abuse: Denies Sexual Abuse: Denies Exploitation of patient/patient's resources: Denies Self-Neglect: Denies  Emotional Status Pt's affect, behavior adn adjustment status: Pt is motivated and wants to recover from his stroke and pacemaker surgery. He feels his therapy is going well and he is makign progress. [name] is glad he is on the rehab unit to get intenisve therapies.  He has always been independent and wants to remain so, if he can. Recent Psychosocial Issues: other health issues, but managed and still was independent Pyschiatric History: No history deferred depression screen due to pt is managing well and optimistic regarding his recovery.  He states: " It is a good day if I am here, you just never know."  Substance Abuse History: No issues  Patient / Family Perceptions, Expectations & Goals Pt/Family understanding of illness & functional limitations: pt and [name] have a good understanding of his stroke and pacemaker.  He  feels he has imporved since this happened.  Both have spoken with the MD's and feel theyre questions are answered and know the treatment plan. Premorbid pt/family roles/activities: Father, Boyfriend, reitree, church member, Retail banker, etc Anticipated changes in roles/activities/participation: resume Pt/family expectations/goals: Pt states: " I nned to get moving and be able to do for myself."  [name] states; " I hope he will be wlaking before he comes home. I can't lift him."  [employer]: None Premorbid Home Care/DME Agencies: None Transportation available at discharge: Daughter's and [employer] referrals recommended: Support group (specify)  Discharge Planning Living Arrangements: Spouse/significant other Support Systems: Spouse/significant other, Children, Water engineer, Social worker community Type of Residence: Private residence [employer] Resources: Multimedia programmer (specify) ([employer]) Museum/gallery curator Resources: [place] Referred: No Living Expenses: Own Money Management: Significant Other Does the patient have any problems obtaining your medications?: No Home Management: [name] does the cooking and cleaning Patient/Family Preliminary Plans: Go to Peggys' home which is a condo with an elevator.  He used to go back and forth to his home to care for his dog, but someone else will be caring for him now. [name] can provide supervision level but not much physical care, due to her own health issues.  Social Work Anticipated Follow Up Needs: HH/OP, Support Group  Clinical Impression Pleasant gentleman who is motivated and willing to do what he needs to improve.  [name] is very supportive and willing to assist him at discharge. His tow daughter's are also supportive, they all make decisions together For what is best for the patient.  The plan is to go to [name]'s home where he spends most of his time prior to admission and have family member  care for his dog at his home.  Will have team conference tomorrow and will Work on discharge.  Elease Hashimoto 02/28/2015, 10:44 AM

## 2015-02-28 NOTE — Progress Notes (Signed)
Speech Language Pathology Daily Session Note  Patient Details  Name: Parker [name] MRN: [id number] Date of Birth: [birth year]  Today's Date: 02/28/2015 SLP Individual Time: 1505-1530 SLP Individual Time Calculation (min): 25 min  Short Term Goals: Week 1: SLP Short Term Goal 1 (Week 1): Pt will identify 2 physical deficits and 2 cognitive deficits with max A given multimodal cueing. SLP Short Term Goal 2 (Week 1): Pt will recall 2 new pieces of information during basic functional tasks with mod A given verbal/visual cueing SLP Short Term Goal 3 (Week 1): Pt will complete basic problem solving tasks during functional tasks with mod-max A given multimodal cueing. SLP Short Term Goal 4 (Week 1): Pt will consume his currently prescribed diet wtih mod I use of swallowing precautions to monitor and correct left sided buccal residue.   Skilled Therapeutic Interventions:  Pt was seen for skilled ST targeting cognitive goals.  Upon arrival, pt was seated upright in wheelchair, awake, alert, and agreeable to participate in ST.  SLP facilitated the session with a basic functional task for menu planning.  Pt utilized a menu to select three meals for the next day with mod assist verbal and visual cues for sustained attention to task, organization, and working memory.  Pt also benefited from mod-max assist verbal and visual cues for error awareness.  Pt's significant other was present and reported that pt's cognition, while impaired at baseline, is worse s/p CVA.  Pt's significant other reports that pt required assistance for medication and financial management due to memory deficits but he was independent for driving prior to admission.  She notices most specifically changes in his attention span and his ability to problem solve through daily tasks.  SLP provided skilled education regarding current goals for ST and recommended that pt may need 24/7 supervision at discharge.  Pt's significant other verbalized  understanding.  Pt left upright in the wheelchair with all needs left within reach and quick release belt donned.  Continue per current plan of care.     FIM:  Comprehension Comprehension Mode: Auditory Comprehension: 5-Understands complex 90% of the time/Cues < 10% of the time Expression Expression Mode: Verbal Expression: 5-Expresses basic needs/ideas: With extra time/assistive device Social Interaction Social Interaction: 4-Interacts appropriately 75 - 89% of the time - Needs redirection for appropriate language or to initiate interaction. Problem Solving Problem Solving: 3-Solves basic 50 - 74% of the time/requires cueing 25 - 49% of the time Memory Memory: 2-Recognizes or recalls 25 - 49% of the time/requires cueing 51 - 75% of the time  Pain Pain Assessment Pain Assessment: Faces Pain Score: 7  Faces Pain Scale: Hurts a little bit Pain Type: Acute pain Pain Location: Shoulder Pain Orientation: Right Pain Descriptors / Indicators: Aching Pain Frequency: Intermittent Pain Onset: On-going Patients Stated Pain Goal: 2 Pain Intervention(s): RN made aware Multiple Pain Sites: No  Therapy/Group: Individual Therapy  Melvia Matousek, Selinda Orion 02/28/2015, 4:53 PM

## 2015-02-28 NOTE — Progress Notes (Addendum)
Physical Therapy Session Note  Patient Details  Name: Parker [name] MRN: [id number] Date of Birth: 03/06/1927  Today's Date: 02/28/2015 PT Individual Time: 0800-0900; 1420-1450 PT Individual Time Calculation (min): 60 min , 30 min  Short Term Goals: Week 1:  PT Short Term Goal 1 (Week 1): Pt will perform bed mobility with min A and min verbal cues for sequencing PT Short Term Goal 2 (Week 1): Pt will perform bed <> chair transfers with min A and min verbal cues for sequencing PT Short Term Goal 3 (Week 1): Pt will perform ambulation x 50' with LRAD and min A  PT Short Term Goal 4 (Week 1): Pt will ascend/descend 8 stairs with two rails with min A  PT Short Term Goal 5 (Week 1): Pt will demonstrated increased intellectual awareness of deficits during functional mobility with mod cues  Skilled Therapeutic Interventions/Progress Updates:   Tx 1:  Pt on toilet at start of session.  See FIM.  Session focused on neuromuscular re-education via positioning, VCs, manual cues for L stance stability and swing phase components in standing with bil UE support for R and L toe taps, trunk shortening/lengthening/rotating in unsupported sitting reaching within BOS to R and L; facilitation of anterior pelvic tilt in sitting; gait without AD, 10 x 1 bil bridging in supine with assistance. Up/down 5 steps 2 rails with min assist, mod VCs for pt's varying self selected techniques; no LOB or knee buckling.  Pt fatigued after gait x 90' with min/mod assist, no AD,  requesting to lie down. Pt c/o L posterior shoulder pain during session, relieved with lying down. Gait x 100' without AD as above.   Pt demonstrated L inattention, decreased memory for new information of LUE precautions, diffiiculty following multli step commands, and decreased activity tolerance. Pt 's girfriend stated he had been shuffling feet x 1 month  PTA.  Tx 2:  Pt asleep and difficult to arouse.  Eventually conversational, but reluctant  to open eyes.  C/o r eye soreness, since CVA per girlfriend Clinical cytogeneticist; RN informed.  neuromuscular re-education via VCs, manual cues for supine exercised, bil bridging, R and L straight leg raise, bil lower trunk rotation, R and L ankle pumps and ankle circles, R isolated knee flexion with LE off side of bed, 10 x 1 each.  Bed mobility to scoot toward [facility] required mod assist with pt pushing with bil LEs, pulling on rail with RUE.  Pt attempted to pull and push repeatedly with LUE despite max VCS and tactile cues. PT placed pt's LUE on his chest and held it there to prevent him using it forcefully.  Bed> w/c to R with min/mod assist, max VCs to avoid pushing up with LUE. Quick release belt applied and all needs left in place.     Therapy Documentation Precautions:  Precautions Precautions: ICD/Pacemaker, Fall Precaution Comments: CHF, AFIB, heart block and Pacemaker-avoid lifting LUE past shoulder level, do not lift >10lb and no pushing/pulling LUE Restrictions Weight Bearing Restrictions: Yes LUE Weight Bearing: Touch down weight bearing Pain:- none reported in AM  Pain Assessment in PM Pain Assessment: 0-10 Pain Score: 3, R eye ball. RN informed        See FIM for current functional status  Therapy/Group: Individual Therapy  Kimyata Milich 02/28/2015, 4:18 PM

## 2015-02-28 NOTE — Progress Notes (Signed)
Occupational Therapy Session Note  Patient Details  Name: Parker [name] MRN: [id number] Date of Birth: 12-16-1926  Today's Date: 02/28/2015 OT Individual Time: 1015-1130 OT Individual Time Calculation (min): 75 min    Short Term Goals: Week 1:  OT Short Term Goal 1 (Week 1): Pt. will groom self with supervision OT Short Term Goal 2 (Week 1): Pt. will bathe self with supervision OT Short Term Goal 3 (Week 1): Pt. will dress selft with minimal assist OT Short Term Goal 4 (Week 1): Pt. will transfer to toilet with minimal asssit OT Short Term Goal 5 (Week 1): Pt. will transfer to tub/shower with minimal assist  Skilled Therapeutic Interventions/Progress Updates:    Pt seen for ADL retraining with a focus on attention, sequencing, and following one step directions, and family ed with pt's SO.  Pt desired to shower (a shower guard placed over new pacemaker site to keep dry).  Pt transferred into shower with min A but needed mod cues to full move his hips onto bench and foot placement so pt was not sitting at an awkward angle. In shower, pt needed mod cues at times to fully cleanse his feet and bottom. For dressing, pt demonstrated mild impairment with L/R discrimination and spatial relations. Pt would become distracted easily and need mod cues to refocus on task.   Pt then ambulated with min A (shuffling gait pattern) to tub room to practice tub bench transfers. Pt was able to sit down on low bench with steadying A, then moved into tub with just min cues. To stand up from bench, min A as bench was low. Discussed with pt and SO, need for grab bars, tub bench, and general tub transfer safety. Pt walked back to room and sat in recliner with quick release belt on. SO in room with pt.   Therapy Documentation Precautions:  Precautions Precautions: ICD/Pacemaker, Fall Precaution Comments: CHF, AFIB, heart block and Pacemaker-avoid lifting LUE past shoulder level, do not lift >10lb and no pushing/pulling  LUE Restrictions Weight Bearing Restrictions: Yes LUE Weight Bearing: Touch down weight bearing    Pain: Pain Assessment Pain Assessment: No/denies pain   ADL:  See FIM for current functional status  Therapy/Group: Individual Therapy  [place] 02/28/2015, 12:41 PM

## 2015-03-01 ENCOUNTER — Inpatient Hospital Stay (HOSPITAL_COMMUNITY): Payer: Medicare PPO | Admitting: Occupational Therapy

## 2015-03-01 ENCOUNTER — Inpatient Hospital Stay (HOSPITAL_COMMUNITY): Payer: Medicare PPO

## 2015-03-01 DIAGNOSIS — H5711 Ocular pain, right eye: Secondary | ICD-10-CM

## 2015-03-01 MED ORDER — DICLOFENAC SODIUM 1 % TD GEL
2.0000 g | Freq: Three times a day (TID) | TRANSDERMAL | Status: AC
  Administered 2015-03-01 – 2015-03-02 (×3): 2 g via TOPICAL
  Filled 2015-03-01: qty 100

## 2015-03-01 MED ORDER — POLYVINYL ALCOHOL 1.4 % OP SOLN
1.0000 [drp] | OPHTHALMIC | Status: DC | PRN
  Filled 2015-03-01: qty 15

## 2015-03-01 NOTE — Progress Notes (Signed)
Subjective/Complaints: CC  Right eye painj Started yesterday, no trauma, feels gritty, 7/10, no drainage PSH neg eye surgery PPMH  No prior hx R eye pain   ROS:  No CP or SOB , no bleeding from PPM site        Objective: Vital Signs: Blood pressure 131/77, pulse 70, temperature 97.3 F (36.3 C), temperature source Oral, resp. rate 16, height 6' 1"  (1.854 m), weight 86.9 kg (191 lb 9.3 oz), SpO2 98 %. No results found. Results for orders placed or performed during the hospital encounter of 02/24/15 (from the past 72 hour(s))  CBC WITH DIFFERENTIAL     Status: Abnormal   Collection Time: 02/27/15  8:30 AM  Result Value Ref Range   WBC 7.2 4.0 - 10.5 K/uL   RBC 5.03 4.22 - 5.81 MIL/uL   Hemoglobin 15.8 13.0 - 17.0 g/dL   HCT 45.9 39.0 - 52.0 %   MCV 91.3 78.0 - 100.0 fL   MCH 31.4 26.0 - 34.0 pg   MCHC 34.4 30.0 - 36.0 g/dL   RDW 13.8 11.5 - 15.5 %   Platelets 149 (L) 150 - 400 K/uL   Neutrophils Relative % 71 43 - 77 %   Neutro Abs 5.1 1.7 - 7.7 K/uL   Lymphocytes Relative 17 12 - 46 %   Lymphs Abs 1.2 0.7 - 4.0 K/uL   Monocytes Relative 9 3 - 12 %   Monocytes Absolute 0.6 0.1 - 1.0 K/uL   Eosinophils Relative 3 0 - 5 %   Eosinophils Absolute 0.2 0.0 - 0.7 K/uL   Basophils Relative 0 0 - 1 %   Basophils Absolute 0.0 0.0 - 0.1 K/uL  Comprehensive metabolic panel     Status: Abnormal   Collection Time: 02/27/15  8:30 AM  Result Value Ref Range   Sodium 137 135 - 145 mmol/L   Potassium 3.8 3.5 - 5.1 mmol/L   Chloride 100 (L) 101 - 111 mmol/L   CO2 29 22 - 32 mmol/L   Glucose, Bld 125 (H) 65 - 99 mg/dL   BUN 13 6 - 20 mg/dL   Creatinine, Ser 0.96 0.61 - 1.24 mg/dL   Calcium 8.9 8.9 - 10.3 mg/dL   Total Protein 6.6 6.5 - 8.1 g/dL   Albumin 3.2 (L) 3.5 - 5.0 g/dL   AST 40 15 - 41 U/L   ALT 21 17 - 63 U/L   Alkaline Phosphatase 159 (H) 38 - 126 U/L   Total Bilirubin 1.1 0.3 - 1.2 mg/dL   GFR calc non Af Amer >60 >60 mL/min   GFR calc Af Amer >60 >60 mL/min    Comment:  (NOTE) The eGFR has been calculated using the CKD EPI equation. This calculation has not been validated in all clinical situations. eGFR's persistently <60 mL/min signify possible Chronic Kidney Disease.    Anion gap 8 5 - 15     HEENT: normal,  EOMI, Sclera mildly injected, no drainage Cardio: RRR and no murmurs Resp: CTA B/L and unlabored GI: BS positive and NT, ND Extremity:  Pulses positive and No Edema Skin:   Intact and Wound C/D/I and Pacemaker site Neuro: Alert/Oriented, Abnormal Sensory reduced on left side, Abnormal FMC Ataxic/ dec FMC and Inattention Musc/Skel:  Other no painb with UE or LE ROM Gen NAD   Assessment/Plan: 1. Functional deficits secondary to RIght MCA embolic intake which require 3+ hours per day of interdisciplinary therapy in a comprehensive inpatient rehab setting. Physiatrist is providing  close team supervision and 24 hour management of active medical problems listed below. Physiatrist and rehab team continue to assess barriers to discharge/monitor patient progress toward functional and medical goals. Team conference today please see physician documentation under team conference tab, met with team face-to-face to discuss problems,progress, and goals. Formulized individual treatment plan based on medical history, underlying problem and comorbidities. FIM: FIM - Bathing Bathing Steps Patient Completed: Right Arm, Chest, Left Arm, Abdomen, Front perineal area, Buttocks, Right upper leg, Left upper leg, Right lower leg (including foot), Left lower leg (including foot) Bathing: 5: Supervision: Safety issues/verbal cues  FIM - Upper Body Dressing/Undressing Upper body dressing/undressing steps patient completed: Pull shirt over trunk, Thread/unthread right sleeve of pullover shirt/dresss, Thread/unthread left sleeve of pullover shirt/dress, Put head through opening of pull over shirt/dress Upper body dressing/undressing: 5: Supervision: Safety issues/verbal  cues FIM - Lower Body Dressing/Undressing Lower body dressing/undressing steps patient completed: Thread/unthread right underwear leg, Thread/unthread left underwear leg, Pull underwear up/down, Thread/unthread right pants leg, Pull pants up/down, Thread/unthread left pants leg, Don/Doff left sock, Don/Doff right sock, Fasten/unfasten right shoe, Don/Doff left shoe, Don/Doff right shoe, Fasten/unfasten left shoe Lower body dressing/undressing: 5: Supervision: Safety issues/verbal cues  FIM - Toileting Toileting steps completed by patient: Performs perineal hygiene, Adjust clothing after toileting Toileting Assistive Devices: Grab bar or rail for support Toileting: 3: Mod-Patient completed 2 of 3 steps  FIM - Radio producer Devices: Grab bars Toilet Transfers: 4-From toilet/BSC: Min A (steadying Pt. > 75%)  FIM - Bed/Chair Transfer Bed/Chair Transfer Assistive Devices: Arm rests Bed/Chair Transfer: 5: Supine > Sit: Supervision (verbal cues/safety issues), 4: Bed > Chair or W/C: Min A (steadying Pt. > 75%)  FIM - Locomotion: Wheelchair Distance: 150 Locomotion: Wheelchair: 0: Activity did not occur FIM - Locomotion: Ambulation Locomotion: Ambulation Assistive Devices: Other (comment) (none) Ambulation/Gait Assistance: 3: Mod assist, 4: Min assist Locomotion: Ambulation: 2: Travels 50 - 149 ft with moderate assistance (Pt: 50 - 74%)  Comprehension Comprehension Mode: Auditory Comprehension: 5-Understands complex 90% of the time/Cues < 10% of the time  Expression Expression Mode: Verbal Expression: 5-Expresses basic needs/ideas: With extra time/assistive device  Social Interaction Social Interaction: 4-Interacts appropriately 75 - 89% of the time - Needs redirection for appropriate language or to initiate interaction.  Problem Solving Problem Solving: 3-Solves basic 50 - 74% of the time/requires cueing 25 - 49% of the time  Memory Memory: 2-Recognizes  or recalls 25 - 49% of the time/requires cueing 51 - 75% of the time  Medical Problem List and Plan: 1. Functional deficits secondary to right MCA infarct as well as left occipital small infarct embolic secondary to atrial fibrillation 2.  DVT Prophylaxis/Anticoagulation: SCDs. Monitor for any signs of DVT 3. Pain Management: Tylenol as needed 4. Complete heart block/atrial fibrillation/bradycardia. CRT pacemaker 02/22/2015 per Dr. Lovena Le. Cardiac rate controlled 5. Neuropsych: This patient is capable of making decisions on his own behalf. 6. Skin/Wound Care: Routine skin checks 7. Fluids/Electrolytes/Nutrition: Routine I&O with follow-up chemistries 8. Hypertension. Coreg 3.125 mg twice a day, lisinopril 20 mg daily. Monitor with increased mobility 9. Chronic systolic congestive heart failure. Monitor for any signs of fluid overload 10. Hyperlipidemia. Lipitor 11. BPH. Continue Flomax. Check PVR 3 12.  RIght eye pain, mild injection, pt rubbing eye, likely local irritation, start saline drops, if increasing erythema may need opthalmic abx, no optho consult needed at this time LOS (Days) 5 A FACE TO FACE EVALUATION WAS PERFORMED  Alysia Penna E 03/01/2015,  6:59 AM

## 2015-03-01 NOTE — Progress Notes (Signed)
Physical Therapy Session Note  Patient Details  Name: Parker [name] MRN: [id number] Date of Birth: 25-Jun-1927  Today's Date: 03/01/2015 PT Individual Time:0800-0900, 1400-1440 60 min, 40 min  -      Short Term Goals: Week 1:  PT Short Term Goal 1 (Week 1): Pt will perform bed mobility with min A and min verbal cues for sequencing PT Short Term Goal 2 (Week 1): Pt will perform bed <> chair transfers with min A and min verbal cues for sequencing PT Short Term Goal 3 (Week 1): Pt will perform ambulation x 50' with LRAD and min A  PT Short Term Goal 4 (Week 1): Pt will ascend/descend 8 stairs with two rails with min A  PT Short Term Goal 5 (Week 1): Pt will demonstrated increased intellectual awareness of deficits during functional mobility with mod cues  Skilled Therapeutic Interventions/Progress Updates:  Tx 1:  Pt on toilet at start of session. See FIM.  Pt stood at sink for hand hygiene, mod cues to use L hand, find soap on L, initiate drying hands.  Pt provided with 18 x 18 w/c and cushion to acommodate his long legs and frame size.  neuromuscular re-education for LUE LLE for: - L attention, bil hand use and standing tolerance, folding 5 pillow cases in 2 minutes -L motor control/cognitive task, bil bridging , L and R straight leg raises, lower trunk rotation with ball between knees,bil hip adduction against resistance, 10 x 2 each in supine  Pt unable to count as instructed during repetitions, requiring mod/max cues, and required max cues not to use L hand during sit>< stand.  Tx 2:  Ongoing family ed with girlfriend [name] for gait, simulated car transfers. She is quite nervous about assisting him, and needs further training.  Pt performed stand pivot car transfer with min assist, max multi-modal cues not to use LUE., x 4.  Gait on level tile with min hand hold assist (L hand) which helps prevent him from using it and breaking precautions, x 150' with VCs for longer step  length, upright posture. Up/down 4 steps x 2 with R rail, L hand in pocket , min guard assist.  Pt demonstrates varying techniques for sequencing LEs on steps, but without LOB or buckling.  Pt returned to room, resting in recliner with quick release belt in place and all needs within reach.  [name] in room.      Therapy Documentation Precautions:  Precautions Precautions: ICD/Pacemaker, Fall Precaution Comments: CHF, AFIB, heart block and Pacemaker-avoid lifting LUE past shoulder level, do not lift >10lb and no pushing/pulling LUE Restrictions Weight Bearing Restrictions: Yes LUE Weight Bearing: Touch down weight bearing   Pain:pt c/o R and L shoulders hurting with upright activities.  PT consulted RN.      See FIM for current functional status  Therapy/Group: Individual Therapy  Kayron Hicklin 03/01/2015, 7:33 AM

## 2015-03-01 NOTE — Progress Notes (Signed)
Physical Therapy Session Note  Patient Details  Name: Parker [name] MRN: [id number] Date of Birth: [birth year]  Today's Date: 03/01/2015 PT Individual Time: 1100-1130 PT Individual Time Calculation (min): 30 min   Short Term Goals: Week 1:  PT Short Term Goal 1 (Week 1): Pt will perform bed mobility with min A and min verbal cues for sequencing PT Short Term Goal 2 (Week 1): Pt will perform bed <> chair transfers with min A and min verbal cues for sequencing PT Short Term Goal 3 (Week 1): Pt will perform ambulation x 50' with LRAD and min A  PT Short Term Goal 4 (Week 1): Pt will ascend/descend 8 stairs with two rails with min A  PT Short Term Goal 5 (Week 1): Pt will demonstrated increased intellectual awareness of deficits during functional mobility with mod cues  Skilled Therapeutic Interventions/Progress Updates:    Patient seen with significant other present.  Wheelchair mobility min assist max cues for left side awareness and technique x 140'.  Patient ambulated 75' x 2 min assist right HHA.  Negotiated 8 steps with one rail min assist max verbal/tactile cues to not use left UE.  Significant other educated in assist pt with ambulation and she assisted him to walk 80' x 2 after hand over hand assist for hand hold technique and cues on how to assist with sit<>stand.  Sit<>supine on bed in ADL apt supervision, cues for technique to avoid use of left UE.  Patient back in recliner with all needs in reach.  Therapy Documentation Precautions:  Precautions Precautions: ICD/Pacemaker, Fall Precaution Comments: CHF, AFIB, heart block and Pacemaker-avoid lifting LUE past shoulder level, do not lift >10lb and no pushing/pulling LUE Restrictions Weight Bearing Restrictions: Yes LUE Weight Bearing: Touch down weight bearing Vital Signs: Therapy Vitals Pulse Rate: 70 BP: 126/76 mmHg (after ambulation) Oxygen Therapy SpO2: 97 % O2 Device: Not Delivered Pain: Pain Assessment Pain Assessment:  No/denies pain    Locomotion : Ambulation Ambulation/Gait Assistance: 4: Min assist Wheelchair Mobility Distance: 140'   See FIM for current functional status  Therapy/Group: Individual Therapy  [name], [name] 03/01/2015  03/01/2015, 1:16 PM

## 2015-03-01 NOTE — Progress Notes (Signed)
Occupational Therapy Session Note  Patient Details  Name: Parker [name] MRN: [id number] Date of Birth: [birth year]  Today's Date: 03/01/2015 OT Individual Time: 0867-6195 OT Individual Time Calculation (min): 45 min    Short Term Goals: Week 1:  OT Short Term Goal 1 (Week 1): Pt. will groom self with supervision OT Short Term Goal 2 (Week 1): Pt. will bathe self with supervision OT Short Term Goal 3 (Week 1): Pt. will dress selft with minimal assist OT Short Term Goal 4 (Week 1): Pt. will transfer to toilet with minimal asssit OT Short Term Goal 5 (Week 1): Pt. will transfer to tub/shower with minimal assist  Skilled Therapeutic Interventions/Progress Updates:    Pt seen for BADL retraining of shower and dressing with a focus on sequencing and attention to task. Pt was able to ambulate into bathroom to sit on toilet and and transfer into shower with steadying A. Pt has a shuffling gait pattern. Pt is able to bathe and dress with supervision but needs constant cues as he get distracted after each step. For example, when asked to put on his socks and shoes pt dons one sock and then sits. He needs cue to don next sock. Pt will sit until cued to don his shoe, and so forth.  Pt is quite talkative and tends to perseverate on a topic. Fair standing balance during shower and dressing.  Pt was able to dial the phone with one cue to dial 9 first to call his SO. Pt resting in recliner with quick release belt and call light in reach.  Therapy Documentation Precautions:  Precautions Precautions: ICD/Pacemaker, Fall Precaution Comments: CHF, AFIB, heart block and Pacemaker-avoid lifting LUE past shoulder level, do not lift >10lb and no pushing/pulling LUE Restrictions Weight Bearing Restrictions: Yes LUE Weight Bearing: Touch down weight bearing    Vital Signs: Therapy Vitals Temp: 97.3 F (36.3 C) Temp Source: Oral Pulse Rate: 70 Resp: 16 BP: 131/77 mmHg Patient Position (if appropriate):  Lying Oxygen Therapy SpO2: 98 % O2 Device: Not Delivered Pain: Pain Assessment Pain Assessment: No/denies pain ADL:  See FIM for current functional status  Therapy/Group: Individual Therapy  [place] 03/01/2015, 10:03 AM

## 2015-03-01 NOTE — Progress Notes (Signed)
Speech Language Pathology Daily Session Note  Patient Details  Name: LARENCE THONE MRN: [id number] Date of Birth: [birth year]  Today's Date: 03/01/2015 SLP Group Time: 1145-1230 SLP Group Time Calculation (min): 45 min  Short Term Goals: Week 1: SLP Short Term Goal 1 (Week 1): Pt will identify 2 physical deficits and 2 cognitive deficits with max A given multimodal cueing. SLP Short Term Goal 2 (Week 1): Pt will recall 2 new pieces of information during basic functional tasks with mod A given verbal/visual cueing SLP Short Term Goal 3 (Week 1): Pt will complete basic problem solving tasks during functional tasks with mod-max A given multimodal cueing. SLP Short Term Goal 4 (Week 1): Pt will consume his currently prescribed diet wtih mod I use of swallowing precautions to monitor and correct left sided buccal residue.   Skilled Therapeutic Interventions:  Pt was seen for group ST targeting goals for dysphagia.  Pt was received upright in wheelchair, awake, alert, and agreeable to participate in ST.  SLP facilitated the session with min verbal cues to attend to objects on the left side of his tray.  Pt consumed presentations of his currently prescribed diet with supervision cues to monitor and correct oral residue post swallow.  No overt s/s of aspiration were evident across solids or liquids.  Continue per current plan of care.     FIM:  Comprehension Comprehension Mode: Auditory Comprehension: 5-Understands complex 90% of the time/Cues < 10% of the time Expression Expression Mode: Verbal Expression: 5-Expresses basic needs/ideas: With extra time/assistive device Social Interaction Social Interaction: 4-Interacts appropriately 75 - 89% of the time - Needs redirection for appropriate language or to initiate interaction. Problem Solving Problem Solving: 3-Solves basic 50 - 74% of the time/requires cueing 25 - 49% of the time Memory Memory: 2-Recognizes or recalls 25 - 49% of the  time/requires cueing 51 - 75% of the time FIM - Eating Eating Activity: 5: Supervision/cues  Pain Pain Assessment Pain Assessment: No/denies pain  Therapy/Group: Individual Therapy  Hamna Asa, Selinda Orion 03/01/2015, 4:33 PM

## 2015-03-01 NOTE — Patient Care Conference (Signed)
Inpatient RehabilitationTeam Conference and Plan of Care Update Date: 03/01/2015   Time: 11;30 AM    Patient Name: Parker [name]      Medical Record Number: [id number]  Date of Birth: 05/11/1927 Sex: Male         Room/Bed: 4M01C/4M01C-01 Payor Info: Payor: [employer] MEDICARE / Plan: [employer] MEDICARE CHOICE PPO / Product Type: *No Product type* /    Admitting Diagnosis: [id number] R CVA   Admit Date/Time:  02/24/2015  4:20 PM Admission Comments: No comment available   Primary Diagnosis:  Acute right MCA stroke Principal Problem: Acute right MCA stroke  Patient Active Problem List   Diagnosis Date Noted  . Acute urinary retention 02/23/2015  . Atrial fibrillation 02/23/2015  . Benign hypertension 02/23/2015  . Hyperlipidemia LDL goal <70 02/23/2015  . Leukocytosis 02/23/2015  . Urinary retention 02/23/2015  . Pacemaker   . Stroke with cerebral ischemia   . Acute right MCA stroke 02/22/2015  . Acute left hemiparesis 02/22/2015  . Hemi-neglect of left side 02/22/2015  . Left homonymous hemianopsia 02/22/2015  . Anosognosia 02/22/2015  . Complete heart block   . Chronic systolic heart failure   . 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 Cardiomyopathy 02/24/2014    Expected Discharge Date: Expected Discharge Date: 03/08/15  Team Members Present: Physician leading conference: Dr. Alysia Penna Social Worker Present: Ovidio Kin, LCSW Nurse Present: Heather Roberts, RN PT Present: Raylene Everts, Renaye Rakers, PT OT Present: Simonne Come, Dorothyann Gibbs, OT SLP Present: Windell Moulding, SLP PPS Coordinator present : Daiva Nakayama, RN, CRRN     Current Status/Progress Goal Weekly Team Focus  Medical   severe cognitive deficits, some premorbid issues, needed help for financial issues  home with supervision  work on attn to left and training family   Bowel/Bladder   Continent to bowel and bladder.  To continue continent to bowel and bladder with  min. assisst.  Toileting Q 2 hrs. and PRN.   Swallow/Nutrition/ Hydration   soft diet, thin liquids, intermittent supervision for use of swallowing precautions  Mod I with least restrictive diet   diet toleration and trials of advanced consistencies   ADL's   min A transfers, max cues with dressing, bathing  supervision overall   cognition, perceptual skills, ADL retraining, family education   Mobility   does not remember LUE precautions during function, min/mod assist transfers, w/c 100' with supervision, gait x 100' with min/mod assist, 5 stairs with min assist  supervision overall including gait x 150' and 12 stairs, except min assist in community  activity tolerance, LUE precautions, gait training, balance, neuro re-ed   Communication             Safety/Cognition/ Behavioral Observations  mod-max assist for basic due to decreased sustained attention to tasks, poor recall of daily information, and poor awareness of deficits   min assist   address cognition during basic, functional, familiar tasks    Pain   Pain on bioth shoulders. Uses tylenol 650 Q 4 hrs. PRN.  To keep pain levels less than 3 on scale 1 to 10.  To assess pain llevels Q 2 hrs. and PRN.   Skin   Rt. groin small incision is dry and intact.Some bruises on left chest.  To keep skin free of pressure sores.  To monitor skin condition Q shift and PRN.      *See Care Plan and progress notes for long and short-term goals.  Barriers to Discharge: caregiver "timid"    Possible Resolutions to Barriers:  furhter training    Discharge Planning/Teaching Needs:  Home with [name]-girlfriend who can provide supervision level.  She is here daily to observe in therapies      Team Discussion:  Supervision level goals-needs constant cueing-mostly cognitive issues form stroke. No awareness of deficits. Berg 29/56 high risk to fall. Poor re-call, will need 24 hr care.  [name]-girlfriend wants to try and provide the care he needs.   Revisions to Treatment Plan:  If [name] unable to provide the care pt needs may need to consider NH   Continued Need for Acute Rehabilitation Level of Care: The patient requires daily medical management by a physician with specialized training in physical medicine and rehabilitation for the following conditions: Daily direction of a multidisciplinary physical rehabilitation program to ensure safe treatment while eliciting the highest outcome that is of practical value to the patient.: Yes Daily medical management of patient stability for increased activity during participation in an intensive rehabilitation regime.: Yes Daily analysis of laboratory values and/or radiology reports with any subsequent need for medication adjustment of medical intervention for : Neurological problems;Other  Chaddrick Brue, Gardiner Rhyme 03/01/2015, 2:12 PM

## 2015-03-01 NOTE — Progress Notes (Signed)
Social Work Patient ID: Parker [name], male   DOB: Nov 07, 1926, 81 y.o.   MRN: [id number] Met with pt and [name] to discuss team conference goals-supervision/min level and discharge 6/8.  [name] plans to get invoved early learning his care, due to this makes her nervous. She wants to take him home but wants to be able to manage him and if she can't they will need to come up with a different discharge plan. She is starting now to attend therapies with Pt.  Pt aware she will need to be able to handle him for him to go home. Will work toward discharge and see how peggy does and if she is comfortable with his care.

## 2015-03-02 ENCOUNTER — Inpatient Hospital Stay (HOSPITAL_COMMUNITY): Payer: Medicare PPO

## 2015-03-02 ENCOUNTER — Inpatient Hospital Stay (HOSPITAL_COMMUNITY): Payer: Medicare PPO | Admitting: Speech Pathology

## 2015-03-02 MED ORDER — POLYVINYL ALCOHOL 1.4 % OP SOLN
2.0000 [drp] | Freq: Three times a day (TID) | OPHTHALMIC | Status: DC
  Administered 2015-03-02 – 2015-03-08 (×17): 2 [drp] via OPHTHALMIC
  Filled 2015-03-02: qty 15

## 2015-03-02 NOTE — Progress Notes (Addendum)
Physical Therapy Session Note  Patient Details  Name: Parker [name] MRN: [id number] Date of Birth: [birth year]  Today's Date: 03/02/2015 PT Individual Time:0905-1005, 1440-1510 PT Individual Time Calculation (min): 60, 30 min   Short Term Goals: Week 1:  PT Short Term Goal 1 (Week 1): Pt will perform bed mobility with min A and min verbal cues for sequencing PT Short Term Goal 2 (Week 1): Pt will perform bed <> chair transfers with min A and min verbal cues for sequencing PT Short Term Goal 3 (Week 1): Pt will perform ambulation x 50' with LRAD and min A  PT Short Term Goal 4 (Week 1): Pt will ascend/descend 8 stairs with two rails with min A  PT Short Term Goal 5 (Week 1): Pt will demonstrated increased intellectual awareness of deficits during functional mobility with mod cues  Skilled Therapeutic Interventions/Progress Updates:   Tx 1:  Ongoing family ed with girlfriend [name] for community mobility , gait, on sloping pavement, up/down brick steps, in a crowded gift shop.  She needs further practice on gait in busy community settings.   Gait on level tile x 150' with close supervison> min guard assistance, holding pt's L hand to keep him from using it.  Transfer to dining chair without arms, with min guard assist, max cues to scoot up to table.    Up/down 4 steps x 2 with R rail, min assist.  Cognitive task of locating items in gift shop, with extra time and mod cues to look left, pt found greeting cards and fresh flowers.  Pt returned to room and left sitting in w/c with quick release belt on, all needs in reach.  tx 2:  Pt on toilet at start of session.  After gait toilet> room, pt reported his pants got wet.  [name] cued and guarded pt as he doffed pants and doned clean pants.   Simulated car transfer to SUV height seat, x 2 with girlfriend [name], pt continues to use LUE unless hand is held, despite max cues. [name] needs more practice to safely guard him on this, particularly  LUE use.  Pt stated he needed to use toilet for BM, urgently.  [name] guarded pt back to room appropriately.  Toilet transfer per FIM.  [place] A exs standing at sink, for fall prevention: 10 x 1 each mini squats, bil calf raises, R and L hip abduction, R and L hamstring curls, with max multimodal cues and demo. Pt unable to perform toe raises, and demonstrated difficultly abducting LLE . Pt exhausted and unable to perform further activity.    Transferred to recliner; all needs left within reach and quick release belt applied.     Therapy Documentation Precautions:  Precautions Precautions: ICD/Pacemaker, Fall Precaution Comments: CHF, AFIB, heart block and Pacemaker-avoid lifting LUE past shoulder level, do not lift >10lb and no pushing/pulling LUE Restrictions Weight Bearing Restrictions: Yes LUE Weight Bearing: Touch down weight bearing  Pain: Pain Assessment Pain Assessment: No/denies pain    Locomotion : Ambulation Ambulation/Gait Assistance: 4: Min assist    See FIM for current functional status  Therapy/Group: Individual Therapy  Tyrah Broers 03/02/2015, 4:41 PM

## 2015-03-02 NOTE — Progress Notes (Signed)
Speech Language Pathology Daily Session Note  Patient Details  Name: Parker [name] MRN: [id number] Date of Birth: [birth year]  Today's Date: 03/02/2015 SLP Individual Time: 0800-0900 SLP Individual Time Calculation (min): 60 min  Short Term Goals: Week 1: SLP Short Term Goal 1 (Week 1): Pt will identify 2 physical deficits and 2 cognitive deficits with max A given multimodal cueing. SLP Short Term Goal 2 (Week 1): Pt will recall 2 new pieces of information during basic functional tasks with mod A given verbal/visual cueing SLP Short Term Goal 3 (Week 1): Pt will complete basic problem solving tasks during functional tasks with mod-max A given multimodal cueing. SLP Short Term Goal 4 (Week 1): Pt will consume his currently prescribed diet wtih mod I use of swallowing precautions to monitor and correct left sided buccal residue.   Skilled Therapeutic Interventions:  Pt was seen for skilled ST targeting cognitive goals.  Upon arrival, pt was seated upright in bed, completing his meal.  Pt reported that he was finished with breakfast and he had no evidence of pocketing left in the oral cavity despite not having placed his dentures prior to meal.  Pt was transferred to wheelchair to maximize attention and alertness during cognitive tasks.  Pt completed basic self care tasks such as washing his hands and placing his dentures with step by step min assist verbal cues.  Pt would often ask permission from SLP before completing steps of the task.   SLP facilitated the session with a basic novel task targeting functional problem solving and sustained attention to task.  Pt organized letters of the alphabet into consecutive order with min-mod assist verbal cues.  Pt then located letters from a large poster in order with mod assist verbal cues for working memory.  SLP then facilitated the session with a picture sequencing task targeting sustained attention and thought organization.  Pt put 4 pictures in sequential  order with max to total assist for organization and error awareness.  Pt also benefited from mod-max assist verbal and visual cues for redirection to task.  Pt's working memory was noted to limit his performance during task in addition to decreased sustained attention and poor awareness of errors.  Pt was left in room, upright in wheelchair with all needs left within reach.  Continue per current plan of care.     FIM:  Comprehension Comprehension Mode: Auditory Comprehension: 4-Understands basic 75 - 89% of the time/requires cueing 10 - 24% of the time Expression Expression Mode: Verbal Expression: 5-Expresses basic 90% of the time/requires cueing < 10% of the time. Social Interaction Social Interaction: 4-Interacts appropriately 75 - 89% of the time - Needs redirection for appropriate language or to initiate interaction. Problem Solving Problem Solving: 3-Solves basic 50 - 74% of the time/requires cueing 25 - 49% of the time Memory Memory: 1-Recognizes or recalls less than 25% of the time/requires cueing greater than 75% of the time FIM - Eating Eating Activity: 5: Supervision/cues  Pain Pain Assessment Pain Assessment: No/denies pain  Therapy/Group: Individual Therapy  Leanette Eutsler, Selinda Orion 03/02/2015, 4:51 PM

## 2015-03-02 NOTE — Progress Notes (Signed)
Subjective/Complaints: CC  Keeps RIght eye closed Pt denies pain or drainage, , no loss of vision, can read calendar, no double vision PSH neg eye surgery PPMH  No prior hx R eye pain   ROS:  No CP or SOB , no bleeding from PPM site        Objective: Vital Signs: Blood pressure 136/75, pulse 70, temperature 98.4 F (36.9 C), temperature source Oral, resp. rate 18, height _0  (1.854 m), weight 86.9 kg (191 lb 9.3 oz), SpO2 92 %. No results found. Results for orders placed or performed during the hospital encounter of 02/24/15 (from the past 72 hour(s))  CBC WITH DIFFERENTIAL     Status: Abnormal   Collection Time: 02/27/15  8:30 AM  Result Value Ref Range   WBC 7.2 4.0 - 10.5 K/uL   RBC 5.03 4.22 - 5.81 MIL/uL   Hemoglobin 15.8 13.0 - 17.0 g/dL   HCT 45.9 39.0 - 52.0 %   MCV 91.3 78.0 - 100.0 fL   MCH 31.4 26.0 - 34.0 pg   MCHC 34.4 30.0 - 36.0 g/dL   RDW 13.8 11.5 - 15.5 %   Platelets 149 (L) 150 - 400 K/uL   Neutrophils Relative % 71 43 - 77 %   Neutro Abs 5.1 1.7 - 7.7 K/uL   Lymphocytes Relative 17 12 - 46 %   Lymphs Abs 1.2 0.7 - 4.0 K/uL   Monocytes Relative 9 3 - 12 %   Monocytes Absolute 0.6 0.1 - 1.0 K/uL   Eosinophils Relative 3 0 - 5 %   Eosinophils Absolute 0.2 0.0 - 0.7 K/uL   Basophils Relative 0 0 - 1 %   Basophils Absolute 0.0 0.0 - 0.1 K/uL  Comprehensive metabolic panel     Status: Abnormal   Collection Time: 02/27/15  8:30 AM  Result Value Ref Range   Sodium 137 135 - 145 mmol/L   Potassium 3.8 3.5 - 5.1 mmol/L   Chloride 100 (L) 101 - 111 mmol/L   CO2 29 22 - 32 mmol/L   Glucose, Bld 125 (H) 65 - 99 mg/dL   BUN 13 6 - 20 mg/dL   Creatinine, Ser 0.96 0.61 - 1.24 mg/dL   Calcium 8.9 8.9 - 10.3 mg/dL   Total Protein 6.6 6.5 - 8.1 g/dL   Albumin 3.2 (L) 3.5 - 5.0 g/dL   AST 40 15 - 41 U/L   ALT 21 17 - 63 U/L   Alkaline Phosphatase 159 (H) 38 - 126 U/L   Total Bilirubin 1.1 0.3 - 1.2 mg/dL   GFR calc non Af Amer >60 >60 mL/min   GFR calc Af  Amer >60 >60 mL/min    Comment: (NOTE) The eGFR has been calculated using the CKD EPI equation. This calculation has not been validated in all clinical situations. eGFR's persistently <60 mL/min signify possible Chronic Kidney Disease.    Anion gap 8 5 - 15     HEENT: normal,  EOMI, Sclera non injected, no drainage, PERRLA Cardio: RRR and no murmurs Resp: CTA B/L and unlabored GI: BS positive and NT, ND Extremity:  Pulses positive and No Edema Skin:   Intact and Wound C/D/I and Pacemaker site Neuro: Alert/Oriented, Abnormal Sensory reduced on left side, Abnormal FMC Ataxic/ dec FMC and Inattention Musc/Skel:  Other no painb with UE or LE ROM Gen NAD   Assessment/Plan: 1. Functional deficits secondary to RIght MCA embolic intake which require 3+ hours per day of interdisciplinary therapy  in a comprehensive inpatient rehab setting. Physiatrist is providing close team supervision and 24 hour management of active medical problems listed below. Physiatrist and rehab team continue to assess barriers to discharge/monitor patient progress toward functional and medical goals.  FIM: FIM - Bathing Bathing Steps Patient Completed: Right Arm, Chest, Left Arm, Abdomen, Front perineal area, Buttocks, Right upper leg, Left upper leg, Right lower leg (including foot), Left lower leg (including foot) Bathing: 5: Supervision: Safety issues/verbal cues  FIM - Upper Body Dressing/Undressing Upper body dressing/undressing steps patient completed: Pull shirt over trunk, Thread/unthread right sleeve of pullover shirt/dresss, Thread/unthread left sleeve of pullover shirt/dress, Put head through opening of pull over shirt/dress Upper body dressing/undressing: 5: Supervision: Safety issues/verbal cues FIM - Lower Body Dressing/Undressing Lower body dressing/undressing steps patient completed: Thread/unthread right underwear leg, Thread/unthread left underwear leg, Pull underwear up/down, Thread/unthread  right pants leg, Pull pants up/down, Thread/unthread left pants leg, Don/Doff left sock, Don/Doff right sock, Fasten/unfasten right shoe, Don/Doff left shoe, Don/Doff right shoe, Fasten/unfasten left shoe Lower body dressing/undressing: 5: Supervision: Safety issues/verbal cues  FIM - Toileting Toileting steps completed by patient: Adjust clothing prior to toileting, Performs perineal hygiene, Adjust clothing after toileting Toileting Assistive Devices: Grab bar or rail for support Toileting: 4: Steadying assist  FIM - Radio producer Devices: Grab bars Toilet Transfers: 4-From toilet/BSC: Min A (steadying Pt. > 75%), 4-To toilet/BSC: Min A (steadying Pt. > 75%)  FIM - Bed/Chair Transfer Bed/Chair Transfer Assistive Devices: Arm rests Bed/Chair Transfer: 4: Bed > Chair or W/C: Min A (steadying Pt. > 75%), 4: Chair or W/C > Bed: Min A (steadying Pt. > 75%)  FIM - Locomotion: Wheelchair Distance: 140' Locomotion: Wheelchair: 1: Total Assistance/staff pushes wheelchair (Pt<25%) FIM - Locomotion: Ambulation Locomotion: Ambulation Assistive Devices: Other (comment) (none) Ambulation/Gait Assistance: 4: Min assist Locomotion: Ambulation: 4: Travels 150 ft or more with minimal assistance (Pt.>75%)  Comprehension Comprehension Mode: Auditory Comprehension: 5-Understands complex 90% of the time/Cues < 10% of the time  Expression Expression Mode: Verbal Expression: 5-Expresses basic needs/ideas: With extra time/assistive device  Social Interaction Social Interaction: 4-Interacts appropriately 75 - 89% of the time - Needs redirection for appropriate language or to initiate interaction.  Problem Solving Problem Solving: 3-Solves basic 50 - 74% of the time/requires cueing 25 - 49% of the time  Memory Memory: 2-Recognizes or recalls 25 - 49% of the time/requires cueing 51 - 75% of the time  Medical Problem List and Plan: 1. Functional deficits secondary to  right MCA infarct as well as left occipital small infarct embolic secondary to atrial fibrillation 2.  DVT Prophylaxis/Anticoagulation: SCDs. Monitor for any signs of DVT 3. Pain Management: Tylenol as needed 4. Complete heart block/atrial fibrillation/bradycardia. CRT pacemaker 02/22/2015 per Dr. Lovena Le. Cardiac rate controlled 5. Neuropsych: This patient is capable of making decisions on his own behalf. 6. Skin/Wound Care: Routine skin checks 7. Fluids/Electrolytes/Nutrition: Routine I&O with follow-up chemistries 8. Hypertension. Coreg 3.125 mg twice a day, lisinopril 20 mg daily. Monitor with increased mobility 9. Chronic systolic congestive heart failure. Monitor for any signs of fluid overload 10. Hyperlipidemia. Lipitor 11. BPH. Continue Flomax. Check PVR 3 12.  RIght eye pain, mild injection, pt rubbing eye, likely local irritation, start saline drops, if increasing erythema may need opthalmic abx, no optho consult needed at this time LOS (Days) 6 A FACE TO FACE EVALUATION WAS PERFORMED  KIRSTEINS,ANDREW E 03/02/2015, 6:51 AM

## 2015-03-02 NOTE — Progress Notes (Signed)
Occupational Therapy Session Note  Patient Details  Name: Parker [name] MRN: [id number] Date of Birth: [birth year]  Today's Date: 03/02/2015 OT Individual Time: 1005-1105 OT Individual Time Calculation (min): 60 min    Short Term Goals: Week 1:  OT Short Term Goal 1 (Week 1): Pt. will groom self with supervision OT Short Term Goal 2 (Week 1): Pt. will bathe self with supervision OT Short Term Goal 3 (Week 1): Pt. will dress selft with minimal assist OT Short Term Goal 4 (Week 1): Pt. will transfer to toilet with minimal asssit OT Short Term Goal 5 (Week 1): Pt. will transfer to tub/shower with minimal assist  Skilled Therapeutic Interventions/Progress Updates:    Pt resting in recliner upon arrival with SO present.  Pt required min encouragement to engaged in BADL retraining but eventually agree after encouragement from SO.  Pt amb without AD to shower and engaged in bathing at shower level.  Pt required min verbal cues for task initiation, sequencing, and thoroughness.  Pt requires max multimodal cues to adhere to LUE precautions.  Pt completed dressing tasks with sit<>stand from seat.  Pt stood at sink to complete shaving tasks with Office manager.  Pt initiated task but required assistance for thoroughness.  Focus on activity tolerance, sit<>stand, standing balance, functional amb without AD, task initiation, sequencing, cognitive remediation, and safety awareness.  Therapy Documentation Precautions:  Precautions Precautions: ICD/Pacemaker, Fall Precaution Comments: CHF, AFIB, heart block and Pacemaker-avoid lifting LUE past shoulder level, do not lift >10lb and no pushing/pulling LUE Restrictions Weight Bearing Restrictions: Yes LUE Weight Bearing: Touch down weight bearing General:   Vital Signs:   Pain: Pain Assessment Pain Assessment: No/denies pain Faces Pain Scale: Hurts a little bit Pain Location: Shoulder Pain Orientation: Right Pain Intervention(s):  Medication (See eMAR)  See FIM for current functional status  Therapy/Group: Individual Therapy  Leroy Libman 03/02/2015, 12:04 PM

## 2015-03-03 ENCOUNTER — Inpatient Hospital Stay (HOSPITAL_COMMUNITY): Payer: Medicare PPO

## 2015-03-03 ENCOUNTER — Inpatient Hospital Stay (HOSPITAL_COMMUNITY): Payer: Medicare PPO | Admitting: Speech Pathology

## 2015-03-03 ENCOUNTER — Inpatient Hospital Stay (HOSPITAL_COMMUNITY): Payer: Medicare PPO | Admitting: Occupational Therapy

## 2015-03-03 NOTE — Progress Notes (Signed)
Physical Therapy Session Note  Patient Details  Name: Parker [name] MRN: [id number] Date of Birth: 01-12-1927  Today's Date: 03/03/2015 PT Individual Time: 1300-1415 PT Individual Time Calculation (min): 75 min   Short Term Goals: Week 1:  PT Short Term Goal 1 (Week 1): Pt will perform bed mobility with min A and min verbal cues for sequencing PT Short Term Goal 2 (Week 1): Pt will perform bed <> chair transfers with min A and min verbal cues for sequencing PT Short Term Goal 3 (Week 1): Pt will perform ambulation x 50' with LRAD and min A  PT Short Term Goal 4 (Week 1): Pt will ascend/descend 8 stairs with two rails with min A  PT Short Term Goal 5 (Week 1): Pt will demonstrated increased intellectual awareness of deficits during functional mobility with mod cues  Skilled Therapeutic Interventions/Progress Updates:  Patient resting in bed upon entering room. Patient supine <> sit with cueing to decrease use of left UE. Although patient can state this precaution, he required cueing throughout the session to follow this. Patient ambulated with left HH assist 150 - 200 feet several times with therapist and girlfriend, [name]. Patient ambulated up and down curb, stepped over and around obstacles with min HHA from therapist and [name]. Patient ambulated up and down 12 steps with R handrail and min assist and cueing to slow down for safety. Patient performed car transfer with [name]'s HHA and cueing. Patient demonstrated supine <> sit on regular double bed with cueing to decrease UE use. Patient instructed in [place] exercises to improve balance and decrease falls x 10 reps each and min assist/cueing to attend to task. Patient easily distracted in gym. Patient reported needing to use bathroom and performed toilet transfer with min assist and toileting tasks with supervision/set up (pants up/down and hygiene). Patient left in bed with all items in reach and [name] present.  Therapy Documentation Precautions:   Precautions Precautions: ICD/Pacemaker, Fall Precaution Comments: CHF, AFIB, heart block and Pacemaker-avoid lifting LUE past shoulder level, do not lift >10lb and no pushing/pulling LUE Restrictions Weight Bearing Restrictions: Yes LUE Weight Bearing: Touch down weight bearing  Pain: Pain Assessment Pain Assessment: 0-10 Pain Score: 5  Pain Location: Shoulder Pain Orientation: Left Pain Descriptors / Indicators: Aching  Asked patient if he wanted the nurse to give him something for pain and he reported no it was not that bad.  See FIM for current functional status  Therapy/Group: Individual Therapy  Elder Love M 03/03/2015, 3:13 PM

## 2015-03-03 NOTE — Progress Notes (Signed)
Subjective/Complaints:  No eye pain but tends to keep R eyelid closed, denies double vision, states it's a habit   ROS:   No constipation , no voiding problems, denies CP                       Objective: Vital Signs: Blood pressure 140/78, pulse 70, temperature 98.2 F (36.8 C), temperature source Oral, resp. rate 18, height 6\' 1"  (1.854 m), weight 86.9 kg (191 lb 9.3 oz), SpO2 98 %. No results found. No results found for this or any previous visit (from the past 72 hour(s)).   HEENT: normal,  EOMI, Sclera non injected, no drainage, PERRLA Cardio: RRR and no murmurs Resp: CTA B/L and unlabored GI: BS positive and NT, ND Extremity:  Pulses positive and No Edema Skin:   Intact and Wound C/D/I and Pacemaker site Neuro: Alert/Oriented, Abnormal Sensory reduced on left side, Abnormal FMC Ataxic/ dec FMC and Inattention Musc/Skel:  Other no painb with UE or LE ROM Gen NAD   Assessment/Plan: 1. Functional deficits secondary to RIght MCA embolic intake which require 3+ hours per day of interdisciplinary therapy in a comprehensive inpatient rehab setting. Physiatrist is providing close team supervision and 24 hour management of active medical problems listed below. Physiatrist and rehab team continue to assess barriers to discharge/monitor patient progress toward functional and medical goals.  FIM: FIM - Bathing Bathing Steps Patient Completed: Right Arm, Chest, Left Arm, Abdomen, Front perineal area, Buttocks, Right upper leg, Left upper leg, Right lower leg (including foot), Left lower leg (including foot) Bathing: 5: Supervision: Safety issues/verbal cues  FIM - Upper Body Dressing/Undressing Upper body dressing/undressing steps patient completed: Pull shirt over trunk, Thread/unthread right sleeve of pullover shirt/dresss, Thread/unthread left sleeve of pullover shirt/dress, Put head through opening of pull over shirt/dress Upper body dressing/undressing: 5: Supervision: Safety  issues/verbal cues FIM - Lower Body Dressing/Undressing Lower body dressing/undressing steps patient completed: Thread/unthread right underwear leg, Thread/unthread left underwear leg, Pull underwear up/down, Thread/unthread right pants leg, Pull pants up/down, Thread/unthread left pants leg, Don/Doff left sock, Don/Doff right sock, Fasten/unfasten right shoe, Don/Doff left shoe, Don/Doff right shoe, Fasten/unfasten left shoe Lower body dressing/undressing: 5: Supervision: Safety issues/verbal cues  FIM - Toileting Toileting steps completed by patient: Adjust clothing prior to toileting, Performs perineal hygiene, Adjust clothing after toileting Toileting Assistive Devices: Grab bar or rail for support Toileting: 5: Supervision: Safety issues/verbal cues  FIM - Radio producer Devices: Grab bars Toilet Transfers: 5-To toilet/BSC: Supervision (verbal cues/safety issues), 4-From toilet/BSC: Min A (steadying Pt. > 75%)  FIM - Bed/Chair Transfer Bed/Chair Transfer Assistive Devices: Arm rests Bed/Chair Transfer: 4: Chair or W/C > Bed: Min A (steadying Pt. > 75%)  FIM - Locomotion: Wheelchair Distance: 140' Locomotion: Wheelchair: 1: Total Assistance/staff pushes wheelchair (Pt<25%) FIM - Locomotion: Ambulation Locomotion: Ambulation Assistive Devices: Other (comment) (none) Ambulation/Gait Assistance: 4: Min assist Locomotion: Ambulation: 4: Travels 150 ft or more with minimal assistance (Pt.>75%)  Comprehension Comprehension Mode: Auditory Comprehension: 4-Understands basic 75 - 89% of the time/requires cueing 10 - 24% of the time  Expression Expression Mode: Verbal Expression: 5-Expresses basic 90% of the time/requires cueing < 10% of the time.  Social Interaction Social Interaction: 4-Interacts appropriately 75 - 89% of the time - Needs redirection for appropriate language or to initiate interaction.  Problem Solving Problem Solving: 3-Solves basic 50  - 74% of the time/requires cueing 25 - 49% of the time  Memory Memory:  1-Recognizes or recalls less than 25% of the time/requires cueing greater than 75% of the time  Medical Problem List and Plan: 1. Functional deficits secondary to right MCA infarct as well as left occipital small infarct embolic secondary to atrial fibrillation 2.  DVT Prophylaxis/Anticoagulation: SCDs. Monitor for any signs of DVT 3. Pain Management: Tylenol as needed 4. Complete heart block/atrial fibrillation/bradycardia. CRT pacemaker 02/22/2015 per Dr. Lovena Le. Cardiac rate controlled 5. Neuropsych: This patient is capable of making decisions on his own behalf. 6. Skin/Wound Care: Routine skin checks 7. Fluids/Electrolytes/Nutrition: Routine I&O with follow-up chemistries 8. Hypertension. Coreg 3.125 mg twice a day, lisinopril 20 mg daily. Monitor with increased mobility 9. Chronic systolic congestive heart failure. Monitor for any signs of fluid overload 10. Hyperlipidemia. Lipitor 11. BPH. Continue Flomax. Check PVR 3 12.  RIght eye pain, resolved with artificial tears, monitor , exam unremarkable LOS (Days) 7 A FACE TO FACE EVALUATION WAS PERFORMED  Parker [name] E 03/03/2015, 6:38 AM

## 2015-03-03 NOTE — Progress Notes (Signed)
Occupational Therapy Session Note  Patient Details  Name: Parker [name] MRN: [id number] Date of Birth: 08/24/1927  Today's Date: 03/03/2015 OT Individual Time: 1000-1057 OT Individual Time Calculation (min): 57 min    Short Term Goals: Week 1:  OT Short Term Goal 1 (Week 1): Pt. will groom self with supervision OT Short Term Goal 2 (Week 1): Pt. will bathe self with supervision OT Short Term Goal 3 (Week 1): Pt. will dress selft with minimal assist OT Short Term Goal 4 (Week 1): Pt. will transfer to toilet with minimal asssit OT Short Term Goal 5 (Week 1): Pt. will transfer to tub/shower with minimal assist  Skilled Therapeutic Interventions/Progress Updates:  Upon entering the room, pt seated in recliner chair with QRB donned. Pt stating, "Miss, can you please take me to the bathroom. I have this belt on me." Pt ambulated into bathroom without use of AD and close supervision. Pt performed clothing management and hygiene with supervision as well. Pt ambulating in room to obtain clothing items needed. Pt declined shower this session. Pt standing at sink side for bathing and dressing tasks. Pt required mod verbal cues in order to maintain precautions this session. Pt returned self to bed and refused to leave bed for remainder of session secondary to reported 7/10 R shoulder pain. RN notified. OT reviewed OT purpose, POC, and goals with pt in further detail in order to educate on importance of participation in therapy session. Pt supine in bed with call bell and bed alarm activated prior to exiting the room.   Therapy Documentation Precautions:  Precautions Precautions: ICD/Pacemaker, Fall Precaution Comments: CHF, AFIB, heart block and Pacemaker-avoid lifting LUE past shoulder level, do not lift >10lb and no pushing/pulling LUE Restrictions Weight Bearing Restrictions: Yes LUE Weight Bearing: Touch down weight bearing  See FIM for current functional status  Therapy/Group: Individual  Therapy  Phineas Semen 03/03/2015, 12:09 PM

## 2015-03-03 NOTE — Progress Notes (Signed)
Speech Language Pathology Weekly Progress and Session Note  Patient Details  Name: Parker [name] MRN: [id number] Date of Birth: Oct 06, 1926  Beginning of progress report period: Feb 24, 2015  End of progress report period:  June 2,2016  Today's Date: 03/03/2015 SLP Individual Time: 0800-0900 SLP Individual Time Calculation (min): 60 min  Short Term Goals: Week 1: SLP Short Term Goal 1 (Week 1): Pt will identify 2 physical deficits and 2 cognitive deficits with max A given multimodal cueing. SLP Short Term Goal 1 - Progress (Week 1): Discontinued (comment) (due to lack of progress ) SLP Short Term Goal 2 (Week 1): Pt will recall 2 new pieces of information during basic functional tasks with mod A given verbal/visual cueing SLP Short Term Goal 2 - Progress (Week 1): Revised due to lack of progress SLP Short Term Goal 3 (Week 1): Pt will complete basic problem solving tasks during functional tasks with mod-max A given multimodal cueing. SLP Short Term Goal 3 - Progress (Week 1): Met SLP Short Term Goal 4 (Week 1): Pt will consume his currently prescribed diet wtih mod I use of swallowing precautions to monitor and correct left sided buccal residue.  SLP Short Term Goal 4 - Progress (Week 1): Progressing toward goal    New Short Term Goals: Week 2: SLP Short Term Goal 1 (Week 2): Pt will use memory compensatory aids with max assist mulitmodal cues to facilitate recall of basic, daily information.   SLP Short Term Goal 2 (Week 2): Pt will complete basic problem solving tasks during functional tasks with mod-max assist verbal cueing. SLP Short Term Goal 3 (Week 2): Pt will consume his currently prescribed diet wtih mod I use of swallowing precautions to monitor and correct left sided buccal residue.  SLP Short Term Goal 4 (Week 2): Pt will return demonstration of use of call bell with min assist verbal cues.    Weekly Progress Updates:  Pt made slow functional gains this reporting period and  has met 1 out of 4 short term goals.  Pt currently requires mod-max assist multimodal cuing for most basic, familiar tasks due to left inattention, decreased sustained attention to task, decreased working memory, and poor intellectual awareness of deficits.  Pt is consuming dys 3 textures and thin liquids with overall supervision cues for rate and portion control and to monitor and correct left sided buccal residua of solids.  Pt would continue to benefit from skilled ST while inpatient in order to maximize functional independence and reduce burden of care prior to discharge.  Pt and family education is ongoing.  Recommend 24/7 supervision and ST follow up at next level of care at discharge.    Intensity: Minumum of 1-2 x/day, 30 to 90 minutes Frequency: 3 to 5 out of 7 days Duration/Length of Stay: 14-21 days Treatment/Interventions: Cognitive remediation/compensation;Cueing hierarchy;Functional tasks;Environmental controls;Internal/external aids;Patient/family education;Speech/Language facilitation;Dysphagia/aspiration precaution training   Daily Session  Skilled Therapeutic Interventions: Pt was seen for skilled ST targeting cognitive goals.  Upon arrival, pt was laying flat in bed, awake, alert, and agreeable to participate in [place].  Pt was transferred with min verbal cues for initiation, sequencing, and recall of pacemaker precautions (no pushing, pulling, heavy lifting).  SLP facilitated the session with a basic grocery planning task targeting sustained attention and functional problem solving for home management.  Pt required overall min-mod assist verbal and visual cues and the use of a written list to locate targeted items from a grocery store flyer.  Pt also  completed basic functional math calculations to calculate the total for the abovementioned items with min assist verbal cues for error awareness and to double check calculations for accuracy.  Pt required max assist multimodal cues to organize  a list of transactions into chart by chronological order due to decreased thought organization, decreased sustained attention to task, and decreased working memory.  Pt was left in room upright in wheelchair, quick release belt donned and all needs left within reach.  Goals updated on this date to reflect current progress and plan of care.       FIM:  Comprehension Comprehension Mode: Auditory Comprehension: 4-Understands basic 75 - 89% of the time/requires cueing 10 - 24% of the time Expression Expression Mode: Verbal Expression: 5-Expresses basic 90% of the time/requires cueing < 10% of the time. Social Interaction Social Interaction: 4-Interacts appropriately 75 - 89% of the time - Needs redirection for appropriate language or to initiate interaction. Problem Solving Problem Solving: 3-Solves basic 50 - 74% of the time/requires cueing 25 - 49% of the time Memory Memory: 2-Recognizes or recalls 25 - 49% of the time/requires cueing 51 - 75% of the time General    Pain Pain Assessment Pain Assessment: No/denies pain  Therapy/Group: Individual Therapy  Maritza Hosterman, Selinda Orion 03/03/2015, 3:50 PM

## 2015-03-04 ENCOUNTER — Inpatient Hospital Stay (HOSPITAL_COMMUNITY): Payer: Medicare PPO

## 2015-03-04 NOTE — Progress Notes (Addendum)
Physical Therapy Session Note  Patient Details  Name: Parker [name] MRN: [id number] Date of Birth: 08/13/1927  Today's Date: 03/04/2015 PT Individual Time: 1445-1530 PT Individual Time Calculation (min): 45 min   Short Term Goals: Week 1:  PT Short Term Goal 1 (Week 1): Pt will perform bed mobility with min A and min verbal cues for sequencing PT Short Term Goal 2 (Week 1): Pt will perform bed <> chair transfers with min A and min verbal cues for sequencing PT Short Term Goal 3 (Week 1): Pt will perform ambulation x 50' with LRAD and min A  PT Short Term Goal 4 (Week 1): Pt will ascend/descend 8 stairs with two rails with min A  PT Short Term Goal 5 (Week 1): Pt will demonstrated increased intellectual awareness of deficits during functional mobility with mod cues  Skilled Therapeutic Interventions/Progress Updates:   Session focused on addressing functional gait and transfers from various surfaces (including toileting) with overall close S to steady A (performed HHA at times per pt preference), stair negotiation for home and community mobility, reviewed [place] HEP for functional strengthening and balance performing 10 reps x 2 sets each of each exercise with cues for technique and 2# ankle weights, dynamic gait through obstacle course navigating turns, stepping over objects, and then picking up items from floor with steady A, bed mobility on flat bed for supine rest breaks, and addressed cognition during mobility with min to mod verbal cues for sequencing and memory. Overall, pt did very well during session. Left up in recliner at end of session with safety belt donned and all needs in reach.   Therapy Documentation Precautions:  Precautions Precautions: ICD/Pacemaker, Fall Precaution Comments: CHF, AFIB, heart block and Pacemaker-avoid lifting LUE past shoulder level, do not lift >10lb and no pushing/pulling LUE Restrictions Weight Bearing Restrictions: Yes LUE Weight Bearing: Touch down  weight bearing  Pain:  Denies pain.    Locomotion : Ambulation Ambulation/Gait Assistance: 5: Supervision;4: Min guard   See FIM for current functional status  Therapy/Group: Individual Therapy  Canary Brim Ivory Broad, PT, DPT  03/04/2015, 3:37 PM

## 2015-03-04 NOTE — Progress Notes (Signed)
   Subjective/Complaints:  Feels well, has no complaints. Was walking with assistance  ROS:   No constipation , no voiding problems, denies CP                       Objective: Vital Signs: Blood pressure 130/81, pulse 71, temperature 98.5 F (36.9 C), temperature source Oral, resp. rate 18, height 6\' 1"  (1.854 m), weight 208 lb 8.9 oz (94.6 kg), SpO2 96 %.   Elderly male, NAD Chest- CTA CV- REG RATE ABD- soft, NT Ext- no edema  Assessment/Plan: 1. Functional deficits secondary to RIght MCA embolic infarct  Medical Problem List and Plan: 1. Functional deficits secondary to right MCA infarct as well as left occipital small infarct embolic secondary to atrial fibrillation 2.  DVT Prophylaxis/Anticoagulation: SCDs. Monitor for any signs of DVT 3. Pain Management: Tylenol as needed 4. Complete heart block/atrial fibrillation/bradycardia. CRT pacemaker 02/22/2015 per Dr. Lovena Le. Cardiac rate controlled 5. Neuropsych: This patient is capable of making decisions on his own behalf. 6. Skin/Wound Care: Routine skin checks 7. Fluids/Electrolytes/Nutrition: Routine I&O with follow-up chemistries 8. Hypertension- 117/66-138/78-. Coreg 3.125 mg twice a day, lisinopril 20 mg daily. Monitor with increased mobility 9. Chronic systolic congestive heart failure. Monitor for any signs of fluid overload 10. Hyperlipidemia. Lipitor 11. BPH. Continue Flomax. Check PVR 3 12.  RIght eye pain, resolved  LOS (Days) 8 A FACE TO [place] 03/04/2015, 8:32 AM

## 2015-03-05 ENCOUNTER — Inpatient Hospital Stay (HOSPITAL_COMMUNITY): Payer: Medicare PPO | Admitting: Occupational Therapy

## 2015-03-05 MED ORDER — DICLOFENAC SODIUM 1 % TD GEL
2.0000 g | Freq: Three times a day (TID) | TRANSDERMAL | Status: DC | PRN
  Administered 2015-03-05 – 2015-03-06 (×2): 2 g via TOPICAL
  Filled 2015-03-05: qty 100

## 2015-03-05 NOTE — Progress Notes (Signed)
   Subjective/Complaints:  Feels well, has no complaints. eating breakfast.                         Objective: Vital Signs: Blood pressure 124/76, pulse 76, temperature 98.1 F (36.7 C), temperature source Oral, resp. rate 18, height 6\' 1"  (1.854 m), weight 208 lb 8.9 oz (94.6 kg), SpO2 98 %.   Elderly male, NAD Chest- CTA CV- REG RATE ABD- soft, NT Ext- no edema  Assessment/Plan: 1. Functional deficits secondary to RIght MCA embolic infarct  Medical Problem List and Plan: 1. Functional deficits secondary to right MCA infarct as well as left occipital small infarct embolic secondary to atrial fibrillation 2.  DVT Prophylaxis/Anticoagulation: SCDs. Monitor for any signs of DVT 3. Pain Management: Tylenol as needed 4. Complete heart block/atrial fibrillation/bradycardia. CRT pacemaker 02/22/2015 per Dr. Lovena Le. Cardiac rate controlled 5. Neuropsych: This patient is capable of making decisions on his own behalf. 6. Skin/Wound Care: Routine skin checks 7. Fluids/Electrolytes/Nutrition: Routine I&O with follow-up chemistries 8. Hypertension- 100/68-124/72. Coreg 3.125 mg twice a day, lisinopril 20 mg daily.  9. Chronic systolic congestive heart failure. Monitor for any signs of fluid overload 10. Hyperlipidemia. Lipitor 11. BPH. Continue Flomax. Check PVR 3 12.  RIght eye pain, resolved  LOS (Days) 9 A FACE TO [place] 03/05/2015, 8:30 AM

## 2015-03-05 NOTE — Progress Notes (Signed)
Occupational Therapy Weekly Progress Note  Patient Details  Name: Parker [name] MRN: [id number] Date of Birth: 02-10-1927  Beginning of progress report period: Feb 25, 2015 End of progress report period: March 05, 2015  Today's Date: 03/05/2015 OT Individual Time: 1000-1045 OT Individual Time Calculation (min): 45 min    Patient has met 5 of 5 short term goals.  Pt has progressed well with his balance and motor function, but continues to need mod-max cues with his self care.  Patient continues to demonstrate the following deficits: spatial relations impairments, L/R discrimination impairments, decreased problem solving and sequencing skills, impaired memory and therefore will continue to benefit from skilled OT intervention to enhance overall performance with BADL.  Patient progressing toward long term goals..  Continue plan of care.  OT Short Term Goals Week 1:  OT Short Term Goal 1 (Week 1): Pt. will groom self with supervision OT Short Term Goal 1 - Progress (Week 1): Met OT Short Term Goal 2 (Week 1): Pt. will bathe self with supervision OT Short Term Goal 2 - Progress (Week 1): Met OT Short Term Goal 3 (Week 1): Pt. will dress selft with minimal assist OT Short Term Goal 3 - Progress (Week 1): Met OT Short Term Goal 4 (Week 1): Pt. will transfer to toilet with minimal asssit OT Short Term Goal 4 - Progress (Week 1): Met OT Short Term Goal 5 (Week 1): Pt. will transfer to tub/shower with minimal assist OT Short Term Goal 5 - Progress (Week 1): Met Week 2:  OT Short Term Goal 1 (Week 2): STGs = LTGs with a focus on pt requiring less cuing  Skilled Therapeutic Interventions/Progress Updates:  Pt seen for ADL retraining of B/D with a focus on pt completing tasks with fewer cues. Pt continues to need a great deal of cuing with many steps. For example, he continues to try to stand to remove pants and his feet become tangled in pants. Cues need to sit to doff/don clothing over feet. For  underwear, pants and shirt, he began to don all three backwards and needed cues for orientation of clothing.  Pt ambulated to tub room and then to gym with HHA. In tub room, practiced tub bench transfers with verbal cues only. In gym, hot pack applied to shoulder 10 min while pt worked on counting and [employer]. Pt held money in L hand with good attention to hand and counted all the bills correctly. Pt ambulated back to room and requested to get back in bed. Bed alarm set.     Brief Interview for Mental Status:    Memory:  Word Repetition:   "Sock"  "Blue"  "Bed"    Orientation Level:    Year:    2016  Month:   May (June with 1 cue)  Day of Week:   Friday  (Sunday with 1 cue) [facility]   Memory:  Recall:       "Sock"       "Blue"       "Bed"           Therapy Documentation Precautions:  Precautions Precautions: ICD/Pacemaker, Fall Precaution Comments: CHF, AFIB, heart block and Pacemaker-avoid lifting LUE past shoulder level, do not lift >10lb and no pushing/pulling LUE Restrictions Weight Bearing Restrictions: Yes LUE Weight Bearing: Touch down weight bearing   Pain: Pain Assessment Pain Assessment: No/denies pain Pain Score: 7  Pain Type: Acute pain Pain Location: Shoulder Pain Orientation: Left Pain Descriptors /  Indicators: Aching Pain Onset: On-going Pain Intervention(s): Repositioned;Rest, hot pack 10 min  ADL: See FIM for current functional status  Therapy/Group: Individual Therapy  [place] 03/05/2015, 10:20 AM

## 2015-03-06 ENCOUNTER — Inpatient Hospital Stay (HOSPITAL_COMMUNITY): Payer: Medicare PPO

## 2015-03-06 ENCOUNTER — Encounter: Payer: Self-pay | Admitting: *Deleted

## 2015-03-06 ENCOUNTER — Inpatient Hospital Stay (HOSPITAL_COMMUNITY): Payer: Medicare PPO | Admitting: Speech Pathology

## 2015-03-06 ENCOUNTER — Inpatient Hospital Stay (HOSPITAL_COMMUNITY): Payer: Medicare PPO | Admitting: Occupational Therapy

## 2015-03-06 DIAGNOSIS — I5030 Unspecified diastolic (congestive) heart failure: Secondary | ICD-10-CM

## 2015-03-06 NOTE — Progress Notes (Signed)
Physical Therapy Session Note  Patient Details  Name: Parker [name] MRN: [id number] Date of Birth: Feb 21, 1927  Today's Date: 03/06/2015 PT Individual Time: 1300-1400 PT Individual Time Calculation (min): 60 min   Short Term Goals: Week 1:  PT Short Term Goal 1 (Week 1): Pt will perform bed mobility with min A and min verbal cues for sequencing PT Short Term Goal 2 (Week 1): Pt will perform bed <> chair transfers with min A and min verbal cues for sequencing PT Short Term Goal 3 (Week 1): Pt will perform ambulation x 50' with LRAD and min A  PT Short Term Goal 4 (Week 1): Pt will ascend/descend 8 stairs with two rails with min A  PT Short Term Goal 5 (Week 1): Pt will demonstrated increased intellectual awareness of deficits during functional mobility with mod cues  Skilled Therapeutic Interventions/Progress Updates:    Patient seen with significant other who assisted pt to walk to gym with hand hold assist and practice getting in/out of car with supervision and cues.  Negotiated stairs 4 x 3 with PT supervision/assist to prevent right UE use, then with caregiver assist after demo/education.  Patient ambulated without UE support x 130' with facilitation for upright posture and arm swing.  Seated postural awareness/strength with 10 sec hold in trunk extension without UE support x 8 reps.  Seated and supine exercises as noted below.  Standing balance activity kicking ball with alternate feet without UE support, then in parallel bars rolling ball under foot.  Ambulated to dayroom with assist from caregiver and standing balance work with putting green placing balls and putting into holes with supervision/minguard assist.  Patient ambulated 270' to room with assist of caregiver.  Ice placed on right shoulder at rest due to c/o pain posterior aspect.   Therapy Documentation Precautions:  Precautions Precautions: ICD/Pacemaker, Fall Precaution Comments: CHF, AFIB, heart block and Pacemaker-avoid  lifting LUE past shoulder level, do not lift >10lb and no pushing/pulling LUE Restrictions Weight Bearing Restrictions: Yes LUE Weight Bearing: Touch down weight bearing Vital Signs: Therapy Vitals Temp: 97.3 F (36.3 C) Temp Source: Axillary (pt was sleeping couldn't keep mouth closed.) Pulse Rate: 72 Resp: 18 BP: 112/75 mmHg Patient Position (if appropriate): Sitting Oxygen Therapy SpO2: 97 % O2 Device: Not Delivered Pain: Pain Assessment Pain Score: 4  Pain Type: Acute pain Pain Location: Shoulder Pain Descriptors / Indicators: Aching Pain Intervention(s): Ambulation/increased activity;Cold applied;Repositioned Mobility:   Locomotion : Ambulation Ambulation/Gait Assistance: 4: Min guard;5: Supervision    Exercises: General Exercises - Upper Extremity Shoulder Horizontal ABduction: Strengthening;10 reps;Right;Theraband Theraband Level (Shoulder Horizontal Abduction): Level 1 (Yellow) Other Exercises Other Exercises: rows right UE yellow t-band 2 x 10 Other Exercises: supine towel roll at spine. scapular retraction x 10 3-5 sec hold Other Exercises: passive stretch to internal rotators on left 3 x 30 sec hold  See FIM for current functional status  Therapy/Group: Individual Therapy  [place], [place] 03/06/2015  03/06/2015, 4:18 PM

## 2015-03-06 NOTE — Progress Notes (Signed)
Speech Language Pathology Daily Session Note  Patient Details  Name: Parker [name] MRN: [id number] Date of Birth: 1927/05/16  Today's Date: 03/06/2015 SLP Individual Time: 9024-0973 SLP Individual Time Calculation (min): 30 min  Short Term Goals: Week 2: SLP Short Term Goal 1 (Week 2): Pt will use memory compensatory aids with max assist mulitmodal cues to facilitate recall of basic, daily information.   SLP Short Term Goal 2 (Week 2): Pt will complete basic problem solving tasks during functional tasks with mod-max assist verbal cueing. SLP Short Term Goal 3 (Week 2): Pt will consume his currently prescribed diet wtih mod I use of swallowing precautions to monitor and correct left sided buccal residue.  SLP Short Term Goal 4 (Week 2): Pt will return demonstration of use of call bell with min assist verbal cues.    Skilled Therapeutic Interventions: Skilled treatment session focused on patient/caregiver education with the patient's girlfriend, Parker [name].  SLP facilitated session by providing education in regards to the patient's current cognitive function and strategies to utilize at home to increase attention, recall, safety and overall cognitive activity. Both were also educated on need for patient to have 24 hour supervision and assistance with medication and money management. Both verbalized understanding and handouts were also given to reinforce information.  Patient left in recliner with quick release belt in place and girlfriend present. Continue with current plan of care.    FIM:  Comprehension Comprehension Mode: Auditory Comprehension: 4-Understands basic 75 - 89% of the time/requires cueing 10 - 24% of the time Expression Expression Mode: Verbal Expression: 5-Expresses basic 90% of the time/requires cueing < 10% of the time. Social Interaction Social Interaction: 5-Interacts appropriately 90% of the time - Needs monitoring or encouragement for participation or interaction. Problem  Solving Problem Solving: 3-Solves basic 50 - 74% of the time/requires cueing 25 - 49% of the time Memory Memory: 3-Recognizes or recalls 50 - 74% of the time/requires cueing 25 - 49% of the time FIM - Eating Eating Activity: 5: Supervision/cues  Pain Pain Assessment Pain Assessment: No/denies pain Pain Score: 4  Pain Type: Acute pain Pain Location: Shoulder Pain Descriptors / Indicators: Aching Pain Intervention(s): Ambulation/increased activity;Cold applied;Repositioned  Therapy/Group: Individual Therapy  Brittanee Ghazarian 03/06/2015, 4:24 PM

## 2015-03-06 NOTE — Progress Notes (Signed)
Subjective/Complaints: 81 yo male with Afib , HT, and CHF who sufferred R MCA infarct with Left neglect , gait d/o and cognitive deficits  No current complaiont but thinks it is [place] rather than Monday. Keeps R eye closed but states "it feels ok" ROS Denied breathing issues, no bowel or bladder issues                          Objective: Vital Signs: Blood pressure 147/67, pulse 88, temperature 98.3 F (36.8 C), temperature source Oral, resp. rate 20, height 6\' 1"  (1.854 m), weight 94.6 kg (208 lb 8.9 oz), SpO2 96 %. No results found. No results found for this or any previous visit (from the past 72 hour(s)).   HEENT: normal,  EOMI, Sclera non injected, no drainage, PERRLA Cardio: RRR and no murmurs Resp: CTA B/L and unlabored GI: BS positive and NT, ND Extremity:  Pulses positive and No Edema Skin:   Intact and Wound C/D/I at Pacemaker site Neuro: Alert/Oriented, Abnormal Sensory reduced on left side, Abnormal FMC Ataxic/ dec FMC and Inattention Musc/Skel:  Other no painb with UE or LE ROM Gen NAD   Assessment/Plan: 1. Functional deficits secondary to RIght MCA embolic intake which require 3+ hours per day of interdisciplinary therapy in a comprehensive inpatient rehab setting. Physiatrist is providing close team supervision and 24 hour management of active medical problems listed below. Physiatrist and rehab team continue to assess barriers to discharge/monitor patient progress toward functional and medical goals.  FIM: FIM - Bathing Bathing Steps Patient Completed: Right Arm, Chest, Left Arm, Abdomen, Front perineal area, Buttocks, Right upper leg, Left upper leg, Left lower leg (including foot), Right lower leg (including foot) Bathing: 5: Supervision: Safety issues/verbal cues  FIM - Upper Body Dressing/Undressing Upper body dressing/undressing steps patient completed: Pull shirt over trunk, Thread/unthread right sleeve of pullover shirt/dresss, Thread/unthread left  sleeve of pullover shirt/dress, Put head through opening of pull over shirt/dress Upper body dressing/undressing: 5: Supervision: Safety issues/verbal cues FIM - Lower Body Dressing/Undressing Lower body dressing/undressing steps patient completed: Thread/unthread right underwear leg, Thread/unthread left underwear leg, Pull underwear up/down, Thread/unthread right pants leg, Pull pants up/down, Thread/unthread left pants leg, Don/Doff left sock, Don/Doff right sock Lower body dressing/undressing: 5: Supervision: Safety issues/verbal cues  FIM - Toileting Toileting steps completed by patient: Adjust clothing prior to toileting, Performs perineal hygiene, Adjust clothing after toileting Toileting Assistive Devices: Grab bar or rail for support Toileting: 5: Supervision: Safety issues/verbal cues  FIM - Radio producer Devices: Grab bars Toilet Transfers: 5-To toilet/BSC: Supervision (verbal cues/safety issues), 4-From toilet/BSC: Min A (steadying Pt. > 75%)  FIM - Bed/Chair Transfer Bed/Chair Transfer Assistive Devices: Arm rests Bed/Chair Transfer: 4: Bed > Chair or W/C: Min A (steadying Pt. > 75%), 4: Chair or W/C > Bed: Min A (steadying Pt. > 75%)  FIM - Locomotion: Wheelchair Distance: 140' Locomotion: Wheelchair: 0: Activity did not occur FIM - Locomotion: Ambulation Locomotion: Ambulation Assistive Devices:  (HHA at times) Ambulation/Gait Assistance: 5: Supervision, 4: Min guard Locomotion: Ambulation: 4: Travels 150 ft or more with minimal assistance (Pt.>75%)  Comprehension Comprehension Mode: Auditory Comprehension: 4-Understands basic 75 - 89% of the time/requires cueing 10 - 24% of the time  Expression Expression Mode: Verbal Expression: 5-Expresses basic 90% of the time/requires cueing < 10% of the time.  Social Interaction Social Interaction: 5-Interacts appropriately 90% of the time - Needs monitoring or encouragement for participation or  interaction.  Problem Solving Problem Solving: 3-Solves basic 50 - 74% of the time/requires cueing 25 - 49% of the time  Memory Memory: 3-Recognizes or recalls 50 - 74% of the time/requires cueing 25 - 49% of the time  Medical Problem List and Plan: 1. Functional deficits secondary to right MCA infarct as well as left occipital small infarct embolic secondary to atrial fibrillation 2.  DVT Prophylaxis/Anticoagulation: SCDs. Monitor for any signs of DVT 3. Pain Management: Tylenol as needed 4. Complete heart block/atrial fibrillation/bradycardia. CRT pacemaker 02/22/2015 per Dr. Lovena Le. Cardiac rate controlled 5. Neuropsych: This patient is capable of making decisions on his own behalf. 6. Skin/Wound Care: Routine skin checks 7. Fluids/Electrolytes/Nutrition: Routine I&O with follow-up chemistries 8. Hypertension. Coreg 3.125 mg twice a day, lisinopril 20 mg daily. Monitor with increased mobility 9. Chronic systolic congestive heart failure. Monitor for any signs of fluid overload, no signs at present , monitor weights 10. Hyperlipidemia. Lipitor 11. BPH. Continue Flomax.  12.  RIght eye pain, resolved with artificial tears, monitor , exam unremarkable LOS (Days) [place] E 03/06/2015, 6:52 AM

## 2015-03-06 NOTE — Progress Notes (Signed)
Occupational Therapy Session Note  Patient Details  Name: Parker [name] MRN: [id number] Date of Birth: 09-Sep-1927  Today's Date: 03/06/2015 OT Individual Time: 1030-1130 OT Individual Time Calculation (min): 60 min    Short Term Goals: Week 1:  OT Short Term Goal 1 (Week 1): Pt. will groom self with supervision OT Short Term Goal 1 - Progress (Week 1): Met OT Short Term Goal 2 (Week 1): Pt. will bathe self with supervision OT Short Term Goal 2 - Progress (Week 1): Met OT Short Term Goal 3 (Week 1): Pt. will dress selft with minimal assist OT Short Term Goal 3 - Progress (Week 1): Met OT Short Term Goal 4 (Week 1): Pt. will transfer to toilet with minimal asssit OT Short Term Goal 4 - Progress (Week 1): Met OT Short Term Goal 5 (Week 1): Pt. will transfer to tub/shower with minimal assist OT Short Term Goal 5 - Progress (Week 1): Met Week 2:  OT Short Term Goal 1 (Week 2): STGs = LTGs with a focus on pt requiring less cuing  Skilled Therapeutic Interventions/Progress Updates:    Pt seen for BADL retraining and family education with pt's SO, Parker [name]. Education focused on pt's need for frequent cuing to attend to tasks, sequence through tasks, and problem solving. [name] was able to observe this as pt needed cuing for shower to fully wash all body parts. Pt thought he had finished the shower when he had not washed his feet/ lower legs and bottom. He had confusion and difficulty problem solving how to orient clothing to don clothing and how problem solve turning clothing around. Parker [name] stated that she also observes this and recognizes the need for the cuing. Pt was able to complete all of his self care with close S and cues.  He is able to stand up easily without using his L arm. Pt then sat on EOB and worked on postural exercises and stretching to improve posture for functional mobility and to reduce shoulder discomfort. Pt sits in a very kyphotic posture but is able to achieve full trunk extension  with cues to lift his head. Parker [name] stated she would work on this at home. Pt anxious to rest and layed back in the bed with his significant other in the room with him.     Therapy Documentation Precautions:  Precautions Precautions: ICD/Pacemaker, Fall Precaution Comments: CHF, AFIB, heart block and Pacemaker-avoid lifting LUE past shoulder level, do not lift >10lb and no pushing/pulling LUE Restrictions Weight Bearing Restrictions: Yes LUE Weight Bearing: Touch down weight bearing      Pain: Pain Assessment Pain Assessment: No/denies pain ADL:    See FIM for current functional status  Therapy/Group: Individual Therapy  [place] 03/06/2015, 11:49 AM

## 2015-03-06 NOTE — Progress Notes (Addendum)
Physical Therapy Weekly Progress Note  Patient Details  Name: Parker [name] MRN: [id number] Date of Birth: [birth year]  Beginning of progress report period: 02/24/15 End of progress report period:03/06/15  Today's Date: 03/06/2015 PT Individual Time: 0800-0900 PT Individual Time Calculation (min): 60 min   Patient has met 4 of 5 short term goals.  Pt continues to have poor awareness of deficits that impact his function.  He is internally distracted about R shoulder pain during standing and ambulation  activities. He continues to require cues about 50% of the time to observe LUE precautions.  Patient continues to demonstrate the following deficits: balance, awareness, activity tolerance, pain and therefore will continue to benefit from skilled PT intervention to enhance overall performance with activity tolerance, balance, postural control, ability to compensate for deficits, functional use of  left upper extremity and left lower extremity, attention, awareness and knowledge of precautions.  Patient progressing toward long term goals..  Continue plan of care.  PT Short Term Goals= LTGs due to LOS Week 1:  PT Short Term Goal 1 (Week 1): Pt will perform bed mobility with min A and min verbal cues for sequencing PT Short Term Goal 1 - Progress (Week 1): Met PT Short Term Goal 2 (Week 1): Pt will perform bed <> chair transfers with min A and min verbal cues for sequencing PT Short Term Goal 2 - Progress (Week 1): Met PT Short Term Goal 3 (Week 1): Pt will perform ambulation x 50' with LRAD and min A  PT Short Term Goal 3 - Progress (Week 1): Met PT Short Term Goal 4 (Week 1): Pt will ascend/descend 8 stairs with two rails with min A  PT Short Term Goal 4 - Progress (Week 1): Met PT Short Term Goal 5 (Week 1): Pt will demonstrated increased intellectual awareness of deficits during functional mobility with mod cues PT Short Term Goal 5 - Progress (Week 1): Partly met         Skilled Therapeutic  Interventions/Progress Updates:  Pt sat bedside to eat breakfast while PT donned his socks and shoes. [name] stated pt had had BM this AM.    [name] guarded him during gait to/from gym x 104' , close supervision> min guard assist on L.  Gait velocity 3.28'/sec.  During sit> stand, pt continues to attempt using his L hand to push up; with helper lightly holding L hand, and VCs to use R hand only, he pushes up well. As soon as he got to gym, pt stated he needed to use toilet; unable to state bowel or bladder. Gait to return to room with cues for safe speed, as pt was hurrying.   [name] guarded pt in toilet transfer with cues from PT as pt was unsafe, urgently trying to get to toilet, not pulling pants all the way down, etc.. Pt urinated but did not have BM. Max multi modal cues for hand hygiene at sink in room.  Returned to gym, pt asked to lie down due to shoulder pain.  RN unavailable for verify whether or not pt had had topical meds for shouler pain this AM. Later, Vikki Ports stated that med is PRN, and when pt was asked if he needed it this AM, he stated no.  PT informed her that pt's shoulder pain, usually R, starts during PT and limits his participation.  She will look into the med order chaning to QD.  Pt performed [place] in standing with 2# wt on ankles, with cues  from [place] and attempting to use hand out.  Pt with greater difficulty today following directions.  Activity in standing limited by shoulder pain.    Pt returned to room, gait x 104' with close supervision, resting in recliner with feel elevated, quick release belt applied and all needs in reach. Therapy Documentation Precautions:  Precautions Precautions: ICD/Pacemaker, Fall Precaution Comments: CHF, AFIB, heart block and Pacemaker-avoid lifting LUE past shoulder level, do not lift >10lb and no pushing/pulling LUE Restrictions Weight Bearing Restrictions: Yes LUE Weight Bearing: Touch down weight bearing   Pain: Pain  Assessment Pain Assessment: No/denies pain at rest Pain Score: 4  Pain Type: Acute pain Pain Location: Shoulder Pain Descriptors / Indicators: Aching Pain Intervention(s): Repositioned at rest, but pt required multiple sitting/supine rest breaks due to R posterior shoulder pain; PA notified    See FIM for current functional status  Therapy/Group: Individual Therapy  Sonny Poth 03/06/2015, 5:13 PM

## 2015-03-07 ENCOUNTER — Inpatient Hospital Stay (HOSPITAL_COMMUNITY): Payer: Medicare PPO

## 2015-03-07 ENCOUNTER — Inpatient Hospital Stay (HOSPITAL_COMMUNITY): Payer: Medicare PPO | Admitting: Speech Pathology

## 2015-03-07 ENCOUNTER — Inpatient Hospital Stay (HOSPITAL_COMMUNITY): Payer: Medicare PPO | Admitting: Occupational Therapy

## 2015-03-07 NOTE — Progress Notes (Signed)
Social Work Patient ID: Parker [name], male   DOB: Mar 29, 1927, 81 y.o.   MRN: [id number] Met with pt and [name] regarding being ready for discharge tomorrow.  [name] pleased with how well he has done but is hoping is cognition will get better also. Pt is happy with himself and his progress.  Both want the tub bench and will make referral, no other equipment is needed. Referral made to [facility] for home health follow up.

## 2015-03-07 NOTE — Progress Notes (Signed)
Physical Therapy Session Note  Patient Details  Name: Parker [name] MRN: [id number] Date of Birth: 1927/07/07  Today's Date: 03/07/2015 PT Individual Time: 1038-1105 PT Individual Time Calculation (min): 27 min   Short Term Goals: Week 2:   LTG=STG  Skilled Therapeutic Interventions/Progress Updates:    Patient seen for focus on balance and gait assessment as noted below.  Continued to need frequent redirection for many of balance testing items due to decreased attention.  Patient able to state independent left UE lifting precautions.  Patient ambulating with close supervision no device over level indoor terrain during this session.  Still needs assist for way finding to room.  Patient left in supine with caregiver in room and all needs in reach.  Therapy Documentation Precautions:  Precautions Precautions: ICD/Pacemaker, Fall Precaution Comments: CHF, AFIB, heart block and Pacemaker-avoid lifting LUE past shoulder level, do not lift >10lb and no pushing/pulling LUE Restrictions Weight Bearing Restrictions: Yes LUE Weight Bearing: Touch down weight bearing Vital Signs: Therapy Vitals Pulse Rate: 72 BP: 107/72 mmHg Oxygen Therapy SpO2: 98 % O2 Device: Not Delivered Pain: Pain Assessment Pain Assessment: No/denies pain Locomotion : Ambulation Ambulation/Gait Assistance: 5: Supervision Gait Gait velocity: 2.49 ft/sec   Balance: Berg Balance Test Sit to Stand: Able to stand  independently using hands Standing Unsupported: Able to stand 2 minutes with supervision Sitting with Back Unsupported but Feet Supported on Floor or Stool: Able to sit safely and securely 2 minutes Stand to Sit: Controls descent by using hands Transfers: Able to transfer safely, definite need of hands Standing Unsupported with Eyes Closed: Unable to keep eyes closed 3 seconds but stays steady Standing Ubsupported with Feet Together: Able to place feet together independently and stand for 1 minute with  supervision From Standing, Reach Forward with Outstretched Arm: Can reach forward >12 cm safely (5") From Standing Position, Pick up Object from Floor: Able to pick up shoe, needs supervision From Standing Position, Turn to Look Behind Over each Shoulder: Turn sideways only but maintains balance Turn 360 Degrees: Needs close supervision or verbal cueing Standing Unsupported, Alternately Place Feet on Step/Stool: Able to complete >2 steps/needs minimal assist Standing Unsupported, One Foot in Front: Able to take small step independently and hold 30 seconds Standing on One Leg: Tries to lift leg/unable to hold 3 seconds but remains standing independently Total Score: 33 Static Sitting Balance Static Sitting - Level of Assistance: 7: Independent Dynamic Sitting Balance Dynamic Sitting - Level of Assistance: 7: Independent Static Standing Balance Static Standing - Level of Assistance: 5: Stand by assistance Dynamic Standing Balance Dynamic Standing - Level of Assistance: 5: Stand by assistance  See FIM for current functional status  Therapy/Group: Individual Therapy  [place], Parker [name] 03/07/2015  03/07/2015, 1:01 PM

## 2015-03-07 NOTE — Progress Notes (Signed)
Speech Language Pathology Discharge Summary  Patient Details  Name: Parker [name] MRN: [id number] Date of Birth: 15-Jun-1927  Today's Date: 03/07/2015 SLP Individual Time: 1503-1530 SLP Individual Time Calculation (min): 27 min   Skilled Therapeutic Interventions:  Pt was seen for skilled ST targeting cognitive goals and completion of family education prior to discharge.  Upon arrival, pt was awake, partially reclined in bed, and agreeable to participate in [place].  Pt transferred from bed to recliner to maximize attention and alertness to therapeutic tasks.  SLP administered the MoCA blind to assess progress made towards long term goals in comparison to upon admission to CIR.  Pt scored 6 out of 22 on the abovementioned assessment with deficits noted across all cognitive domains including immediate and delayed recall, abstract reasoning, and attention.   The only points that pt was awarded on said assessment was for orientation, which was noted to fluctuate.  Pt's significant other presented with questions regarding pt's currently prescribed diet.  SLP provided her with a handout of dys 3 textures to maximize carryover in the home environment.  SLP also informed significant other that pt's diet will likely be able to be advanced as tolerated at home given good toleration of current diet demonstrated while on CIR.  All pt's and pt's significant other's questions were answered to their satisfaction at this time.      Patient has met 3 of 5 long term goals.  Patient to discharge at overall Min;Mod level.  Reasons goals not met: pt requires mod-max assist cues for recall of daily information and sustained attention to tasks    Clinical Impression/Discharge Summary:  Pt made slow, inconsistent gains while inpatient and is discharging having met 3 out of 5 long term goals.  Pt is currently min-mod assist for basic cognitive tasks due to impulsivity, decreased initiation, poor intellectual awareness of  deficits, decreased functional problem solving, decreased recall of daily information, and decreased sustained attention to tasks.  Pt is currently consuming a dys 3 diet with thin liquids with supervision- mod I use of swallowing precautions pending environmental distractions.  Pt may be advanced to regular textures and thin liquids as tolerated at home.  Pt is discharging to his significant other's home with 24/7 supervision from his significant other, as well as assistance for medication and financial management, and ST follow up at next level of care.  Pt and family education is complete at this time.     Care Partner:  Caregiver Able to Provide Assistance: Yes  Type of Caregiver Assistance: Physical;Cognitive  Recommendation:  Home Health SLP;Outpatient SLP;24 hour supervision/assistance  Rationale for SLP Follow Up: Maximize cognitive function and independence;Reduce caregiver burden   Equipment: none recommended by SLP    Reasons for discharge: Discharged from hospital   Patient/Family Agrees with Progress Made and Goals Achieved: Yes   See FIM for current functional status  Emilio Math 03/07/2015, 4:22 PM

## 2015-03-07 NOTE — Plan of Care (Signed)
Problem: RH Memory Goal: LTG Patient will use memory compensatory aids to (SLP) LTG: Patient will use memory compensatory aids to recall biographical/new, daily complex information with cues (SLP)  Outcome: Not Met (add Reason) Mod-max cues needed for recall of basic, daily information   Problem: RH Attention Goal: LTG Patient will demonstrate focused/sustained (SLP) LTG: Patient will demonstrate focused/sustained/selective/alternating/divided attention during cognitive/linguistic activities in specific environment with assist for # of minutes (SLP)  Outcome: Not Met (add Reason) Mod-max assist verbal cues needed for redirection

## 2015-03-07 NOTE — Progress Notes (Signed)
Occupational Therapy Session Note  Patient Details  Name: Parker [name] MRN: [id number] Date of Birth: 18-Apr-1927  Today's Date: 03/07/2015 OT Individual Time: 0800-0930 OT Individual Time Calculation (min): 90 min    Short Term Goals: Week 1:  OT Short Term Goal 1 (Week 1): Pt. will groom self with supervision OT Short Term Goal 1 - Progress (Week 1): Met OT Short Term Goal 2 (Week 1): Pt. will bathe self with supervision OT Short Term Goal 2 - Progress (Week 1): Met OT Short Term Goal 3 (Week 1): Pt. will dress selft with minimal assist OT Short Term Goal 3 - Progress (Week 1): Met OT Short Term Goal 4 (Week 1): Pt. will transfer to toilet with minimal asssit OT Short Term Goal 4 - Progress (Week 1): Met OT Short Term Goal 5 (Week 1): Pt. will transfer to tub/shower with minimal assist OT Short Term Goal 5 - Progress (Week 1): Met Week 2:  OT Short Term Goal 1 (Week 2): STGs = LTGs with a focus on pt requiring less cuing  Skilled Therapeutic Interventions/Progress Updates:    Pt seen for BADL retraining with pt/family education with his SO, [name]. Pt ambulated with HHA to bathroom with tub and tub bench. [name] took the lead to today in providing him cues as needed for sequencing, attention to task, orienting clothing, etc. She did well guiding him without over cuing him. Pt was quite distracted today and did need a significant amount of cuing to stay focus on task, but he was able to complete all transfers, bathing, toileting, dressing with cuing only. Pt then ambulated into ADL apt to work on furniture transfers onto bed and low couch. In kitchen, pt worked on Information systems manager into cupboards.  She reports pt does not cook, but his main responsibility is putting away groceries. Pt was able to do this with multiple cues. Ambulated back to room. Discussed need for [name] to have other friends and relatives provide pt S at times so she can have breaks. Pt and SO agreed. Pt eager to lie down in  bed. Bed alarm set with call light in reach.  Therapy Documentation Precautions:  Precautions Precautions: ICD/Pacemaker, Fall Precaution Comments: CHF, AFIB, heart block and Pacemaker-avoid lifting LUE past shoulder level, do not lift >10lb and no pushing/pulling LUE Restrictions Weight Bearing Restrictions: Yes LUE Weight Bearing: Touch down weight bearing    Vital Signs: Therapy Vitals Pulse Rate: 72 BP: 107/72 mmHg Oxygen Therapy SpO2: 98 % O2 Device: Not Delivered Pain: Pain Assessment Pain Assessment: No/denies pain ADL:  See FIM for current functional status  Therapy/Group: Individual Therapy  [place] 03/07/2015, 11:24 AM

## 2015-03-07 NOTE — Discharge Summary (Signed)
Discharge summary job [id number]

## 2015-03-07 NOTE — Discharge Instructions (Signed)
Inpatient Rehab Discharge Instructions  Shilo Pauwels [facility] Discharge date and time: No discharge date for patient encounter.   Activities/Precautions/ Functional Status: Activity: activity as tolerated Diet: soft Wound Care: none needed Functional status:  ___ No restrictions     ___ Walk up steps independently ___ 24/7 supervision/assistance   ___ Walk up steps with assistance ___ Intermittent supervision/assistance  ___ Bathe/dress independently ___ Walk with walker     ___ Bathe/dress with assistance ___ Walk Independently    ___ Shower independently _x STROKE/TIA DISCHARGE INSTRUCTIONS SMOKING Cigarette smoking nearly doubles your risk of having a stroke & is the single most alterable risk factor  If you smoke or have smoked in the last 12 months, you are advised to quit smoking for your health.  Most of the excess cardiovascular risk related to smoking disappears within a year of stopping.  Ask you doctor about anti-smoking medications  [place] Quit Line: 1-800-QUIT NOW  Free Smoking Cessation Classes [phone]  CHOLESTEROL Know your levels; limit fat & cholesterol in your diet  Lipid Panel     Component Value Date/Time   CHOL 114 02/20/2015 0511   TRIG 71 02/20/2015 0511   HDL 44 02/20/2015 0511   CHOLHDL 2.6 02/20/2015 0511   VLDL 14 02/20/2015 0511   LDLCALC 56 02/20/2015 0511      Many patients benefit from treatment even if their cholesterol is at goal.  Goal: Total Cholesterol (CHOL) less than 160  Goal:  Triglycerides (TRIG) less than 150  Goal:  HDL greater than 40  Goal:  LDL (LDLCALC) less than 100   BLOOD PRESSURE American Stroke Association blood pressure target is less that 120/80 mm/Hg  Your discharge blood pressure is:  BP: 136/75 mmHg  Monitor your blood pressure  Limit your salt and alcohol intake  Many individuals will require more than one medication for high blood pressure  DIABETES (A1c is a blood sugar average for last 3 months) Goal  HGBA1c is under 7% (HBGA1c is blood sugar average for last 3 months)  Diabetes: No known diagnosis of diabetes    Lab Results  Component Value Date   HGBA1C 6.1* 02/20/2015     Your HGBA1c can be lowered with medications, healthy diet, and exercise.  Check your blood sugar as directed by your physician  Call your physician if you experience unexplained or low blood sugars.  PHYSICAL ACTIVITY/REHABILITATION Goal is 30 minutes at least 4 days per week  Activity: Increase activity slowly, Therapies: Physical Therapy: Home Health Return to work:   Activity decreases your risk of heart attack and stroke and makes your heart stronger.  It helps control your weight and blood pressure; helps you relax and can improve your mood.  Participate in a regular exercise program.  Talk with your doctor about the best form of exercise for you (dancing, walking, swimming, cycling).  DIET/WEIGHT Goal is to maintain a healthy weight  Your discharge diet is: DIET SOFT Room service appropriate?: Yes; Fluid consistency:: Thin  liquids Your height is:  Height: 6\' 1"  (185.4 cm) Your current weight is: Weight: 86.9 kg (191 lb 9.3 oz) Your Body Mass Index (BMI) is:  BMI (Calculated): 25.3  Following the type of diet specifically designed for you will help prevent another stroke.  Your goal weight range is:    Your goal Body Mass Index (BMI) is 19-24.  Healthy food habits can help reduce 3 risk factors for stroke:  High cholesterol, hypertension, and excess weight.  RESOURCES Stroke/Support  Group:  Call [phone]   STROKE EDUCATION PROVIDED/REVIEWED AND GIVEN TO PATIENT Stroke warning signs and symptoms How to activate emergency medical system (call 911). Medications prescribed at discharge. Need for follow-up after discharge. Personal risk factors for stroke. Pneumonia vaccine given:  Flu vaccine given:  My questions have been answered, the writing is legible, and I understand these instructions.   I will adhere to these goals & educational materials that have been provided to me after my discharge from the hospital.    __ Walk with assistance    ___ Shower with assistance ___ No alcohol     ___ Return to work/school ________  Special Instructions:    COMMUNITY REFERRALS UPON DISCHARGE:    Home Health:   PT,OT,SP,RN  Agency :[facility]   Date of last service:03/08/2015   Medical Equipment/Items Ordered:TUB BENCH  Agency/Supplier:[facility]   [phone]   GENERAL COMMUNITY RESOURCES FOR PATIENT/FAMILY: Support Groups:[facility] SUPPORT GROUP & CAREGIVER SUPPORT GROUP   My questions have been answered and I understand these instructions. I will adhere to these goals and the provided educational materials after my discharge from the hospital.  Patient/Caregiver Signature _______________________________ Date __________  Clinician Signature _______________________________________ Date __________  Please bring this form and your medication list with you to all your follow-up doctor's appointments.

## 2015-03-07 NOTE — Progress Notes (Signed)
Subjective/Complaints: 81 yo male with Afib , HT, and CHF who sufferred R MCA infarct with Left neglect , gait d/o and cognitive deficits  Eye drops really helping my eyes (natural tears)  ROS Denied breathing issues, no bowel or bladder issues                          Objective: Vital Signs: Blood pressure 140/85, pulse 70, temperature 97.5 F (36.4 C), temperature source Oral, resp. rate 17, height 6\' 1"  (1.854 m), weight 94.6 kg (208 lb 8.9 oz), SpO2 96 %. No results found. No results found for this or any previous visit (from the past 72 hour(s)).   HEENT: normal,  EOMI, Sclera non injected, no drainage, PERRLA Cardio: RRR and no murmurs Resp: CTA B/L and unlabored GI: BS positive and NT, ND Extremity:  Pulses positive and No Edema Skin:   Intact and Wound C/D/I at Pacemaker site Neuro: Alert/Oriented, Abnormal Sensory reduced on left side, Abnormal FMC Ataxic/ dec FMC and Inattention Musc/Skel:  Other no pain with UE or LE ROM Gen NAD   Assessment/Plan: 1. Functional deficits secondary to RIght MCA embolic intake which require 3+ hours per day of interdisciplinary therapy in a comprehensive inpatient rehab setting. Physiatrist is providing close team supervision and 24 hour management of active medical problems listed below. Physiatrist and rehab team continue to assess barriers to discharge/monitor patient progress toward functional and medical goals. Should be ready for d/c in am FIM: FIM - Bathing Bathing Steps Patient Completed: Right Arm, Chest, Left Arm, Abdomen, Front perineal area, Buttocks, Right upper leg, Left upper leg, Left lower leg (including foot), Right lower leg (including foot) Bathing: 5: Supervision: Safety issues/verbal cues  FIM - Upper Body Dressing/Undressing Upper body dressing/undressing steps patient completed: Pull shirt over trunk, Thread/unthread right sleeve of pullover shirt/dresss, Thread/unthread left sleeve of pullover shirt/dress,  Put head through opening of pull over shirt/dress Upper body dressing/undressing: 5: Supervision: Safety issues/verbal cues FIM - Lower Body Dressing/Undressing Lower body dressing/undressing steps patient completed: Thread/unthread right underwear leg, Thread/unthread left underwear leg, Pull underwear up/down, Thread/unthread right pants leg, Pull pants up/down, Thread/unthread left pants leg, Don/Doff left sock, Don/Doff right sock Lower body dressing/undressing: 5: Supervision: Safety issues/verbal cues  FIM - Toileting Toileting steps completed by patient: Adjust clothing prior to toileting, Performs perineal hygiene, Adjust clothing after toileting Toileting Assistive Devices: Grab bar or rail for support Toileting: 5: Supervision: Safety issues/verbal cues  FIM - Radio producer Devices: Product manager Transfers: 5-To toilet/BSC: Supervision (verbal cues/safety issues), 5-From toilet/BSC: Supervision (verbal cues/safety issues)  FIM - Control and instrumentation engineer Devices: Arm rests Bed/Chair Transfer: 5: Chair or W/C > Bed: Supervision (verbal cues/safety issues), 5: Bed > Chair or W/C: Supervision (verbal cues/safety issues), 5: Supine > Sit: Supervision (verbal cues/safety issues)  FIM - Locomotion: Wheelchair Distance: 140' Locomotion: Wheelchair: 0: Activity did not occur FIM - Locomotion: Ambulation Locomotion: Ambulation Assistive Devices: Other (comment) (no device) Ambulation/Gait Assistance: 5: Supervision Locomotion: Ambulation: 2: Travels 50 - 149 ft with supervision/safety issues  Comprehension Comprehension Mode: Auditory Comprehension: 4-Understands basic 75 - 89% of the time/requires cueing 10 - 24% of the time  Expression Expression Mode: Verbal Expression: 5-Expresses basic 90% of the time/requires cueing < 10% of the time.  Social Interaction Social Interaction: 5-Interacts appropriately 90% of the time -  Needs monitoring or encouragement for participation or interaction.  Problem Solving Problem Solving: 3-Solves  basic 50 - 74% of the time/requires cueing 25 - 49% of the time  Memory Memory: 3-Recognizes or recalls 50 - 74% of the time/requires cueing 25 - 49% of the time  Medical Problem List and Plan: 1. Functional deficits secondary to right MCA infarct as well as left occipital small infarct embolic secondary to atrial fibrillation 2.  DVT Prophylaxis/Anticoagulation: SCDs. Monitor for any signs of DVT 3. Pain Management: Tylenol as needed 4. Complete heart block/atrial fibrillation/bradycardia. CRT pacemaker 02/22/2015 per Dr. Lovena Le. Cardiac rate controlled- f/u with cardiology as outpt 5. Neuropsych: This patient is capable of making decisions on his own behalf. 6. Skin/Wound Care: Routine skin checks 7. Fluids/Electrolytes/Nutrition: Routine I&O with follow-up chemistries 8. Hypertension. Coreg 3.125 mg twice a day, lisinopril 20 mg daily. Monitor with increased mobility 9. Chronic systolic congestive heart failure. Monitor for any signs of fluid overload, no signs at present , monitor weights 10. Hyperlipidemia. Lipitor 11. BPH. Continue Flomax.  12.  RIght eye pain, resolved with artificial tears, monitor , exam unremarkable LOS (Days) [place] 03/07/2015, 6:44 AM

## 2015-03-07 NOTE — Progress Notes (Signed)
Occupational Therapy Discharge Summary  Patient Details  Name: Parker [name] MRN: [id number] Date of Birth: October 14, 1926   Patient has met 10 of 12 long term goals due to improved activity tolerance, improved balance, postural control and improved coordination.  Patient to discharge at overall Supervision level.  Patient's care partner is independent to provide the necessary physical and cognitive assistance at discharge.    Reasons goals not met: Pt did not meet S goals with his memory or intellectual awareness. Pt continues to have cognitive deficits and requires mod A with memory and awareness.  Recommendation:  Patient will benefit from ongoing skilled OT services in home health setting to continue to advance functional skills in the area of BADL.  Equipment: transfer tub bench  Reasons for discharge: treatment goals met  Patient/family agrees with progress made and goals achieved: Yes  OT Discharge Precautions/Restrictions  Precautions Precautions: ICD/Pacemaker;Fall Precaution Comments: CHF, AFIB, heart block and Pacemaker-avoid lifting LUE past shoulder level, do not lift >10lb and no pushing/pulling LUE    Vital Signs Therapy Vitals Pulse Rate: 72 BP: 107/72 mmHg Oxygen Therapy SpO2: 98 % O2 Device: Not Delivered Pain Pain Assessment Pain Assessment: No/denies pain ADL  supervision with moderate verbal cuing with all self care Vision/Perception  Vision- Assessment Tracking/Visual Pursuits: Decreased smoothness of horizontal tracking;Decreased smoothness of vertical tracking;Requires cues, head turns, or add eye shifts to track;Unable to hold eye position out of midline Saccades: Additional eye shifts occurred during testing;Decreased speed of saccadic movement Visual Fields: No apparent deficits  Cognition Overall Cognitive Status: Impaired/Different from baseline Sustained Attention: Impaired Sustained Attention Impairment: Functional basic;Verbal basic Memory:  Impaired Memory Impairment: Storage deficit;Retrieval deficit;Decreased recall of new information;Decreased short term memory Problem Solving: Impaired Problem Solving Impairment: Verbal basic;Functional basic Sensation Sensation Light Touch Impaired Details: Impaired LLE;Absent LLE Stereognosis: Impaired by gross assessment (L hand) Hot/Cold: Impaired by gross assessment (LUE) Proprioception: Impaired by gross assessment Additional Comments: 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 very slow movements. Motor  Motor Motor: Hemiplegia;Motor impersistence Motor - Discharge Observations: functional LUE/ LLE strength for basic mobility Mobility    close S with ADL transfers Trunk/Postural Assessment  Cervical Assessment Cervical Assessment:  (forward head) Thoracic Assessment Thoracic Assessment:  (kyphotic at rest) Lumbar Assessment Lumbar Assessment:  (posterior pelvic tilt)  Balance Berg Balance Test Sit to Stand: Able to stand  independently using hands Standing Unsupported: Able to stand 2 minutes with supervision Sitting with Back Unsupported but Feet Supported on Floor or Stool: Able to sit safely and securely 2 minutes Stand to Sit: Controls descent by using hands Transfers: Able to transfer safely, definite need of hands Standing Unsupported with Eyes Closed: Unable to keep eyes closed 3 seconds but stays steady Standing Ubsupported with Feet Together: Able to place feet together independently and stand for 1 minute with supervision From Standing, Reach Forward with Outstretched Arm: Can reach forward >12 cm safely (5") From Standing Position, Pick up Object from Floor: Able to pick up shoe, needs supervision From Standing Position, Turn to Look Behind Over each Shoulder: Turn  sideways only but maintains balance Turn 360 Degrees: Needs close supervision or verbal cueing Standing Unsupported, Alternately Place Feet on Step/Stool: Able to complete >2 steps/needs minimal assist Standing Unsupported, One Foot in Front: Able to take small  step independently and hold 30 seconds Standing on One Leg: Tries to lift leg/unable to hold 3 seconds but remains standing independently Total Score: 33 Static Sitting Balance Static Sitting - Level of Assistance: 7: Independent Dynamic Sitting Balance Dynamic Sitting - Level of Assistance: 7: Independent Static Standing Balance Static Standing - Level of Assistance: 5: Stand by assistance Dynamic Standing Balance Dynamic Standing - Level of Assistance: 5: Stand by assistance Extremity/Trunk Assessment RUE Assessment RUE Assessment: Within Functional Limits LUE Assessment LUE Assessment:  (shoulder AROM limited to 90 due to pacemaker precautions, functional grasp)  See FIM for current functional status  Hazen Brumett 03/07/2015, 12:43 PM

## 2015-03-07 NOTE — Discharge Summary (Signed)
Parker [name], HINCH                ACCOUNT NO.:  [id number]  MEDICAL RECORD NO.:  [id number]  LOCATION:  4M01C                        FACILITY:  [facility]  PHYSICIAN:  [name], M.D.DATE OF BIRTH:  [birth year]  DATE OF ADMISSION:  02/24/2015 DATE OF DISCHARGE:  03/08/2015                              DISCHARGE SUMMARY   DISCHARGE DIAGNOSES: 1. Functional deficits secondary to right MCA infarct as well as left     occipital small infarct felt to be embolic secondary to atrial     fibrillation. 2. SCDs for DVT prophylaxis. 3. Hypertension. 4. Chronic systolic congestive heart failure. 5. Hyperlipidemia.  HISTORY OF PRESENT ILLNESS:  This is an 81 year old right-handed male with history of CAD with stenting, chronic systolic congestive heart failure, atrial fibrillation, maintained on aspirin and he was independent and active prior to admission.  He presented on Feb 19, 2015, with left-sided weakness, altered mental status, right gaze preference.  Cranial CT scan consistent with early infarct right middle cerebral artery territory.  The patient did receive tPA.  MRI and MRA showed extensive area of acute infarct right middle cerebral artery territory as well as left occipital small infarct.  Echocardiogram with ejection fraction of 30% and grade 2 diastolic dysfunction.  Findings of occluded right MCA M1 segment, underwent complete revascularization per Interventional Radiology.  The patient did require ventilatory support for short time.  EKG with atrial fibrillation, complete heart block, as well as bouts of bradycardia.  Cardiology consulted, maintained on aspirin 325 mg daily.  Subcutaneous Lovenox was initially added for DVT prophylaxis.  A pacemaker was placed in light of heart block and bradycardia on Feb 22, 2015, per Dr. Lovena Le.  The patient was admitted for comprehensive rehab program.  PAST MEDICAL HISTORY:  See discharge diagnoses.  SOCIAL HISTORY:  Lives with  spouse, independent prior to admission.  Functional status upon admission to rehab services was minimal assist, ambulate 150 feet with the rolling walker, min assist sit to supine, min assist sit to stand, min to mod assist activities of daily living.  PHYSICAL EXAMINATION:  VITAL SIGNS:  Blood pressure 164/79, pulse 85, temperature 97, respirations 18. GENERAL:  This was an alert male, in no acute distress, followed simple commands.  Right gaze preference.  Fair awareness of deficits. LUNGS:  Clear to auscultation.  Cardiac rate controlled. ABDOMEN:  Soft, nontender.  Good bowel sounds.  REHABILITATION HOSPITAL COURSE:  The patient was admitted to inpatient rehab services with therapies initiated on a 3-hour daily basis consisting of physical therapy, occupational therapy, speech therapy, and rehabilitation nursing.  The following issues were addressed during the patient's rehabilitation stay.  Pertaining to Mr. Mcmains right MCA infarct as well as left occipital small infarct, felt to be embolic, continued on aspirin therapy.  The patient would follow up Neurology Services.  Sequential compression devices for DVT prophylaxis.  The patient had undergone pacemaker placement for complete heart block and atrial fibrillation per Dr. Lovena Le of Cardiology Services.  No chest pain or shortness of breath.  Cardiac rate controlled.  He exhibited no signs of fluid overload.  He continued on Lipitor for hyperlipidemia. He did have some benign prostatic hypertrophy, maintained  on Flomax, voiding without difficulty.  The patient received weekly collaborative interdisciplinary team conferences to discuss estimated length of stay, family teaching, and any barriers to discharge.  Sessions focused on patient ambulating to the gym with handheld assistance, practice getting in and out of a car with supervision and cues, negotiating stairs with supervision, ambulating 130 feet.  Seated and supine  exercises were initiated.  He could ambulate to the day room with assistance from a caregiver.  Increase his ambulation distance up to 270 feet prior to discharge.  He was able to gather belongings for activities of daily living.  Education focused on patient needs for frequent cuing to attend to tasks.  Speech therapy follow up sessions focused again on education regards to the patient's current cognitive function and strategies to utilize home, to increase attention recall safety and overall cognitive activity.  It was advised the need for 24-hour supervision for patient safety.  Full family teaching completed and plan discharge to home.  DISCHARGE MEDICATIONS: 1. Aspirin 325 mg p.o. daily. 2. Lipitor 40 mg p.o. daily. 3. Coreg 3.125 mg p.o. b.i.d. 4. Voltaren gel as needed to affected area. 5. Lisinopril 20 mg p.o. daily. 6. Protonix 40 mg p.o. daily. 7. Artificial Tears 3 times daily. 8. Flomax 0.4 mg p.o. daily.  DIET:  Mechanical soft.  The patient would follow up with Dr. Alysia Penna at the outpatient rehab center on April 14, 2015; Dr. Erlinda Hong 1 month, call for appointment; and Dr. Osie Cheeks, Cardiology Services, call for appointment.     Lauraine Rinne, P.A.   ______________________________ [name], M.D.    DA/MEDQ  D:  03/07/2015  T:  03/07/2015  Job:  [id number]  cc:   Champ Mungo. Lovena Le, MD Rosalin Hawking, MD

## 2015-03-07 NOTE — Progress Notes (Signed)
Physical Therapy Discharge Summary  Patient Details  Name: Parker [name] MRN: [id number] Date of Birth: 30-Oct-1926  Today's Date: 03/07/2015 PT Individual Time: 1300-1402 PT Individual Time Calculation (min): 62 min    Patient has met 10 of 10 long term goals due to improved activity tolerance, improved balance, increased strength, improved attention, improved awareness and improved coordination.  Patient to discharge at an ambulatory level Supervision for household, min assist for community.   Patient's care partner is independent to provide the necessary physical and cognitive assistance at discharge.  Reasons goals not met: N/A  Recommendation:  Patient will benefit from ongoing skilled PT services in home health setting to continue to advance safe functional mobility, address ongoing impairments in activity tolerance, safety, balance and strength, and minimize fall risk.  Equipment: No equipment provided  Reasons for discharge: treatment goals met  Patient/family agrees with progress made and goals achieved: Yes   Skilled Therapeutic Interventions/Progress Updates:  Patient seen for education in fall prevention, floor to mat transfer (demonstrated and then performed with min assist,) gait with supervision over ramps, curb, stairs and level controlled surfaces with supervision except on unlevel or curb requiring min assist.  Caregiver present throughout session and providing appropriate level of assist and education in HEP for postural strength and all above information.  Patient also performing car, furniture and bed transfers with supervision and mod independent with bed mobility.  Patient verbalizing pacemaker precautions appropriately, but still needing verbal reminders at times.  Educated caregiver in fall recovery and okay to call 911 if she does not fell comfortable assisting recovery.  Patient left in room in recliner with caregiver and all needs in reach.  PT  Discharge Precautions/Restrictions Precautions Precautions: ICD/Pacemaker;Fall Precaution Comments: CHF, AFIB, heart block and Pacemaker-avoid lifting LUE past shoulder level, do not lift >10lb and no pushing/pulling LUE Restrictions LUE Weight Bearing: Touch down weight bearing Vital Signs Therapy Vitals Pulse Rate: 70 BP: 97/60 mmHg Oxygen Therapy SpO2: 97 % Pain Pain Assessment Pain Assessment: No/denies pain Vision/Perception     Cognition Overall Cognitive Status: Impaired/Different from baseline Arousal/Alertness: Awake/alert Orientation Level: Oriented to person;Oriented to place;Disoriented to situation;Oriented to time Attention: Sustained Sustained Attention: Impaired Sustained Attention Impairment: Functional basic;Verbal basic Memory: Impaired Memory Impairment: Storage deficit;Retrieval deficit;Decreased recall of new information;Decreased short term memory Decreased Short Term Memory: Verbal basic Awareness: Impaired Awareness Impairment: Intellectual impairment Problem Solving: Impaired Problem Solving Impairment: Verbal basic;Functional basic Executive Function: Self Correcting;Self Monitoring;Sequencing;Initiating Sequencing: Impaired Sequencing Impairment: Functional basic Initiating: Impaired Initiating Impairment: Functional basic Self Monitoring: Impaired Self Monitoring Impairment: Verbal basic;Functional basic Self Correcting: Impaired Self Correcting Impairment: Verbal basic;Functional basic Behaviors: Perseveration;Impulsive Safety/Judgment: Impaired Comments: poor awareness of deficts, left inattention  Sensation Sensation Light Touch Impaired Details: Impaired LLE;Absent LLE Additional Comments: 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, 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 Motor  Motor Motor: Hemiplegia;Motor impersistence Motor - Discharge Observations: functional LUE/ LLE strength for basic mobility  Mobility Bed Mobility Supine to Sit: 5: Supervision Supine  to Sit Details: Verbal cues for precautions/safety Supine to Sit Details (indicate cue type and reason): supervision for safety, limit left UE use Sit to Supine: 5: Supervision Sit to Supine - Details: Verbal cues for precautions/safety Sit to Supine - Details (indicate cue type and reason): supervision for safety, limit left UE use Transfers Transfers: Yes Sit to Stand: 5: Supervision Sit to Stand Details: Verbal cues for precautions/safety;Verbal cues for safe use of DME/AE Stand to Sit: 5: Supervision Stand to Sit Details (indicate  cue type and reason): Verbal cues for precautions/safety;Verbal cues for safe use of DME/AE Stand to Sit Details: cues for limiting left UE use Locomotion  Ambulation Ambulation: Yes Ambulation/Gait Assistance: 5: Supervision Ambulation Distance (Feet): 200 Feet Assistive device: None Ambulation/Gait Assistance Details: Verbal cues for precautions/safety Ambulation/Gait Assistance Details: demonstrates decreaed stride length, increased BOS, and postural weakness Gait Gait Pattern: Shuffle;Decreased stride length;Step-through pattern;Wide base of support;Decreased trunk rotation Stairs / Additional Locomotion Stairs: Yes Stairs Assistance: 5: Supervision Stairs Assistance Details: Verbal cues for precautions/safety Stairs Assistance Details (indicate cue type and reason): cues for decreased left UE use, varies pattern from step to to step through Stair Management Technique: One rail Right;Alternating pattern;Step to pattern;Forwards Number of Stairs: 12 Height of Stairs: 6 Curb: 4: Min Administrator Mobility: No  Trunk/Postural Assessment  Cervical Assessment Cervical Assessment: Exceptions to [facility] (forward head and stiff neck) Thoracic Assessment Thoracic Assessment: Exceptions to [facility] (kyphosis) Lumbar Assessment Lumbar Assessment: Exceptions to [facility] (posterior pelvic tilt)  Balance Static Sitting Balance Static Sitting -  Level of Assistance: 7: Independent Dynamic Sitting Balance Dynamic Sitting - Level of Assistance: 7: Independent Static Standing Balance Static Standing - Level of Assistance: 5: Stand by assistance Dynamic Standing Balance Dynamic Standing - Level of Assistance: 5: Stand by assistance Extremity Assessment      RLE Strength RLE Overall Strength Comments: hip flexion 4/5, knee and ankle 4+/5 LLE Strength LLE Overall Strength Comments: hip flexion 4-/5, knee and ankle 4/5  See FIM for current functional status  WYNN,CYNDI 03/07/2015, 5:13 PM  [place], [place] 03/07/2015

## 2015-03-08 ENCOUNTER — Encounter: Payer: Medicare PPO | Admitting: Nurse Practitioner

## 2015-03-08 ENCOUNTER — Telehealth: Payer: Self-pay | Admitting: Internal Medicine

## 2015-03-08 ENCOUNTER — Encounter: Payer: Self-pay | Admitting: *Deleted

## 2015-03-08 MED ORDER — OMEPRAZOLE 20 MG PO CPDR: 20.0000 mg | DELAYED_RELEASE_CAPSULE | Freq: Every day | ORAL | Status: AC

## 2015-03-08 MED ORDER — ASPIRIN 325 MG PO TBEC: 325.0000 mg | DELAYED_RELEASE_TABLET | Freq: Every day | ORAL | Status: AC

## 2015-03-08 MED ORDER — SIMVASTATIN 20 MG PO TABS: 20.0000 mg | ORAL_TABLET | Freq: Every day | ORAL | Status: AC

## 2015-03-08 MED ORDER — POLYVINYL ALCOHOL 1.4 % OP SOLN: 2.0000 [drp] | Freq: Three times a day (TID) | OPHTHALMIC | Status: AC

## 2015-03-08 MED ORDER — CARVEDILOL 3.125 MG PO TABS: 3.1250 mg | ORAL_TABLET | Freq: Two times a day (BID) | ORAL | Status: AC

## 2015-03-08 MED ORDER — TAMSULOSIN HCL 0.4 MG PO CAPS: 0.4000 mg | ORAL_CAPSULE | Freq: Every day | ORAL | Status: AC

## 2015-03-08 MED ORDER — ATORVASTATIN CALCIUM 40 MG PO TABS: 40.0000 mg | ORAL_TABLET | Freq: Every day | ORAL | Status: AC

## 2015-03-08 MED ORDER — DICLOFENAC SODIUM 1 % TD GEL: 2.0000 g | Freq: Three times a day (TID) | TRANSDERMAL | Status: AC | PRN

## 2015-03-08 MED ORDER — LISINOPRIL 20 MG PO TABS: 20.0000 mg | ORAL_TABLET | Freq: Every day | ORAL | Status: AC

## 2015-03-08 NOTE — Progress Notes (Signed)
Pt. And family got d/c instructions.Pt. Ready to go home with family.

## 2015-03-08 NOTE — Progress Notes (Signed)
Subjective/Complaints: 81 yo male with Afib , HT, and CHF who sufferred R MCA infarct with Left neglect , gait d/o and cognitive deficits  Slept well aware of d/c today but used Calendar to orient to day and dayte  ROS Denied breathing issues, no bowel or bladder issues                          Objective: Vital Signs: Blood pressure 131/77, pulse 71, temperature 97.5 F (36.4 C), temperature source Oral, resp. rate 20, height 6\' 1"  (1.854 m), weight 85.1 kg (187 lb 9.8 oz), SpO2 95 %. No results found. No results found for this or any previous visit (from the past 72 hour(s)).   HEENT: normal,  EOMI, Sclera non injected, no drainage, PERRLA Cardio: RRR and no murmurs Resp: CTA B/L and unlabored GI: BS positive and NT, ND Extremity:  Pulses positive and No Edema Skin:   Intact and Wound C/D/I at Pacemaker site Neuro: Alert/Oriented, Abnormal Sensory reduced on left side,and Inattention Musc/Skel:  Other no pain with UE or LE ROM Gen NAD   Assessment/Plan: 1. Functional deficits secondary to RIght MCA embolic intake  Stable for D/C today F/u PCP in 1-2 weeks F/u PM&R 3 weeks See D/C summary See D/C instructionsFIM: FIM - Bathing Bathing Steps Patient Completed: Right Arm, Chest, Left Arm, Abdomen, Front perineal area, Buttocks, Right upper leg, Left upper leg, Left lower leg (including foot), Right lower leg (including foot) Bathing: 5: Supervision: Safety issues/verbal cues  FIM - Upper Body Dressing/Undressing Upper body dressing/undressing steps patient completed: Pull shirt over trunk, Thread/unthread right sleeve of pullover shirt/dresss, Thread/unthread left sleeve of pullover shirt/dress, Put head through opening of pull over shirt/dress Upper body dressing/undressing: 5: Supervision: Safety issues/verbal cues FIM - Lower Body Dressing/Undressing Lower body dressing/undressing steps patient completed: Thread/unthread right underwear leg, Thread/unthread left  underwear leg, Pull underwear up/down, Thread/unthread right pants leg, Pull pants up/down, Thread/unthread left pants leg, Don/Doff left sock, Don/Doff right sock, Fasten/unfasten right shoe, Fasten/unfasten left shoe, Don/Doff right shoe, Don/Doff left shoe Lower body dressing/undressing: 5: Supervision: Safety issues/verbal cues  FIM - Toileting Toileting steps completed by patient: Adjust clothing prior to toileting, Performs perineal hygiene, Adjust clothing after toileting Toileting Assistive Devices: Grab bar or rail for support Toileting: 5: Supervision: Safety issues/verbal cues  FIM - Radio producer Devices: Grab bars Toilet Transfers: 5-To toilet/BSC: Supervision (verbal cues/safety issues), 5-From toilet/BSC: Supervision (verbal cues/safety issues)  FIM - Control and instrumentation engineer Devices: Arm rests Bed/Chair Transfer: 5: Bed > Chair or W/C: Supervision (verbal cues/safety issues), 5: Chair or W/C > Bed: Supervision (verbal cues/safety issues), 6: Sit > Supine: No assist, 6: Supine > Sit: No assist, 6: More than reasonable amt of time  FIM - Locomotion: Wheelchair Distance: 140' Locomotion: Wheelchair: 0: Activity did not occur FIM - Locomotion: Ambulation Locomotion: Ambulation Assistive Devices: Other (comment) (no device) Ambulation/Gait Assistance: 5: Supervision Locomotion: Ambulation: 5: Travels 150 ft or more with supervision/safety issues  Comprehension Comprehension Mode: Auditory Comprehension: 4-Understands basic 75 - 89% of the time/requires cueing 10 - 24% of the time  Expression Expression Mode: Verbal Expression: 5-Expresses basic 90% of the time/requires cueing < 10% of the time.  Social Interaction Social Interaction: 5-Interacts appropriately 90% of the time - Needs monitoring or encouragement for participation or interaction.  Problem Solving Problem Solving: 3-Solves basic 50 - 74% of the  time/requires cueing 25 - 49% of  the time  Memory Memory: 3-Recognizes or recalls 50 - 74% of the time/requires cueing 25 - 49% of the time  Medical Problem List and Plan: 1. Functional deficits secondary to right MCA infarct as well as left occipital small infarct embolic secondary to atrial fibrillation 2.  DVT Prophylaxis/Anticoagulation: SCDs. Monitor for any signs of DVT 3. Pain Management: Tylenol as needed 4. Complete heart block/atrial fibrillation/bradycardia. CRT pacemaker 02/22/2015 per Dr. Lovena Le. Cardiac rate controlled- f/u with cardiology as outpt 5. Neuropsych: This patient is capable of making decisions on his own behalf. 6. Skin/Wound Care: Routine skin checks 7. Fluids/Electrolytes/Nutrition: Routine I&O with follow-up chemistries 8. Hypertension. Coreg 3.125 mg twice a day, lisinopril 20 mg daily. Monitor with increased mobility 9. Chronic systolic congestive heart failure. Monitor for any signs of fluid overload, no signs at present , monitor weights 10. Hyperlipidemia. Lipitor 11. BPH. Continue Flomax.  12.  RIght eye pain, resolved with artificial tears, monitor , exam unremarkable LOS (Days) 12 A FACE TO FACE EVALUATION WAS PERFORMED  KIRSTEINS,ANDREW E 03/08/2015, 7:24 AM

## 2015-03-08 NOTE — Telephone Encounter (Signed)
New Prob    Care giver has some questions regarding next follow up appointment. Please call.

## 2015-03-08 NOTE — Progress Notes (Signed)
Social Work Discharge Note Discharge Note  The overall goal for the admission was met for:   Discharge location: Yes-HOME WITH [name]-GIRLFRIEND-24 HR CARE  Length of Stay: Yes-12 DAYS  Discharge activity level: Yes-SUPERVISION-SOME MIN ASSIST LEVEL  Home/community participation: Yes  Services provided included: MD, RD, PT, OT, SLP, RN, CM, TR, Pharmacy and SW  Financial Services: Private Insurance: [place]  Follow-up services arranged: Home Health: [place] CARE-PT,OT,SP,RN, DME: [facility] and Patient/Family has no preference for HH/DME agencies  Comments (or additional information):[name] HERE DAILY AND ATTENDED THERAPIES WITH PT-[facility] [place].  AWARE OF NEED FOR COGNITIVE CUES  Patient/Family verbalized understanding of follow-up arrangements: Yes  Individual responsible for coordination of the follow-up plan: [name]-GIRLFRIEND & DAUGHTER  Confirmed correct DME delivered: Elease Hashimoto 03/08/2015    Elease Hashimoto

## 2015-03-09 NOTE — Telephone Encounter (Signed)
Spoke with patient's wife and she is going to call Dr Bethanne Ginger office in [place] (his general cardiologist) to find out if they have anyone there to follow devices.  I let her know Dr Caryl Comes goes to [place] also.  She will call back to let me know

## 2015-03-20 DIAGNOSIS — Z87891 Personal history of nicotine dependence: Secondary | ICD-10-CM

## 2015-03-20 DIAGNOSIS — Z95818 Presence of other cardiac implants and grafts: Secondary | ICD-10-CM

## 2015-03-20 DIAGNOSIS — I69391 Dysphagia following cerebral infarction: Secondary | ICD-10-CM | POA: Diagnosis not present

## 2015-03-20 DIAGNOSIS — I10 Essential (primary) hypertension: Secondary | ICD-10-CM

## 2015-03-20 DIAGNOSIS — I2510 Atherosclerotic heart disease of native coronary artery without angina pectoris: Secondary | ICD-10-CM

## 2015-03-20 DIAGNOSIS — I69354 Hemiplegia and hemiparesis following cerebral infarction affecting left non-dominant side: Secondary | ICD-10-CM | POA: Diagnosis not present

## 2015-03-20 DIAGNOSIS — I4891 Unspecified atrial fibrillation: Secondary | ICD-10-CM | POA: Diagnosis not present

## 2015-03-20 DIAGNOSIS — I5022 Chronic systolic (congestive) heart failure: Secondary | ICD-10-CM | POA: Diagnosis not present

## 2015-03-20 DIAGNOSIS — I252 Old myocardial infarction: Secondary | ICD-10-CM

## 2015-03-27 ENCOUNTER — Telehealth: Payer: Self-pay | Admitting: *Deleted

## 2015-03-27 NOTE — Telephone Encounter (Signed)
Esther OT [facility] called and said she was unable to see Parker [name] last week and is asking for ext of visits this week.  Approval given.

## 2015-04-04 ENCOUNTER — Encounter: Payer: Self-pay | Admitting: Nurse Practitioner

## 2015-04-14 ENCOUNTER — Inpatient Hospital Stay: Payer: Medicare PPO | Admitting: Physical Medicine & Rehabilitation

## 2015-04-14 ENCOUNTER — Encounter: Payer: Medicare PPO | Attending: Physical Medicine & Rehabilitation

## 2015-04-24 ENCOUNTER — Telehealth: Payer: Self-pay | Admitting: *Deleted

## 2015-04-24 NOTE — Telephone Encounter (Signed)
Amy Mckee [facility] called to continue visits for Mr Macbride. Approval given.

## 2015-05-09 ENCOUNTER — Ambulatory Visit
Admission: RE | Admit: 2015-05-09 | Discharge: 2015-05-09 | Disposition: A | Discharge status: Home / Self Care | Payer: Medicare PPO | Source: Ambulatory Visit | Attending: Internal Medicine | Admitting: Internal Medicine

## 2015-05-09 ENCOUNTER — Other Ambulatory Visit: Payer: Self-pay | Admitting: Internal Medicine

## 2015-05-09 DIAGNOSIS — M7989 Other specified soft tissue disorders: Secondary | ICD-10-CM | POA: Insufficient documentation

## 2015-10-22 ENCOUNTER — Emergency Department: Payer: Medicare Other

## 2015-10-22 ENCOUNTER — Emergency Department
Admission: EM | Admit: 2015-10-22 | Discharge: 2015-10-22 | Disposition: A | Discharge status: Home / Self Care | Payer: Medicare Other | Attending: Emergency Medicine | Admitting: Emergency Medicine

## 2015-10-22 ENCOUNTER — Encounter: Payer: Self-pay | Admitting: Emergency Medicine

## 2015-10-22 DIAGNOSIS — Z7982 Long term (current) use of aspirin: Secondary | ICD-10-CM | POA: Insufficient documentation

## 2015-10-22 DIAGNOSIS — I252 Old myocardial infarction: Secondary | ICD-10-CM | POA: Insufficient documentation

## 2015-10-22 DIAGNOSIS — Z87891 Personal history of nicotine dependence: Secondary | ICD-10-CM | POA: Diagnosis not present

## 2015-10-22 DIAGNOSIS — Z79899 Other long term (current) drug therapy: Secondary | ICD-10-CM | POA: Insufficient documentation

## 2015-10-22 DIAGNOSIS — I10 Essential (primary) hypertension: Secondary | ICD-10-CM | POA: Diagnosis not present

## 2015-10-22 DIAGNOSIS — I5022 Chronic systolic (congestive) heart failure: Secondary | ICD-10-CM | POA: Insufficient documentation

## 2015-10-22 DIAGNOSIS — R079 Chest pain, unspecified: Secondary | ICD-10-CM | POA: Insufficient documentation

## 2015-10-22 LAB — TROPONIN I
TROPONIN I: 0.03 ng/mL (ref <0.031)
TROPONIN I: 0.03 ng/mL (ref <0.031)

## 2015-10-22 LAB — COMPREHENSIVE METABOLIC PANEL
ALK PHOS: 70 U/L (ref 38–126)
ALT: 15 U/L — ABNORMAL LOW (ref 17–63)
ANION GAP: 11 (ref 5–15)
AST: 25 U/L (ref 15–41)
Albumin: 4 g/dL (ref 3.5–5.0)
BILIRUBIN TOTAL: 1.8 mg/dL — AB (ref 0.3–1.2)
BUN: 18 mg/dL (ref 6–20)
CALCIUM: 9 mg/dL (ref 8.9–10.3)
CO2: 24 mmol/L (ref 22–32)
Chloride: 104 mmol/L (ref 101–111)
Creatinine, Ser: 0.96 mg/dL (ref 0.61–1.24)
GFR calc non Af Amer: >60 mL/min (ref >60)
Glucose, Bld: 96 mg/dL (ref 65–99)
Potassium: 4.5 mmol/L (ref 3.5–5.1)
Sodium: 139 mmol/L (ref 135–145)
Total Protein: 7.4 g/dL (ref 6.5–8.1)

## 2015-10-22 LAB — CBC WITH DIFFERENTIAL/PLATELET
BASOS PCT: 1 %
Basophils Absolute: 0.1 10*3/uL (ref 0–0.1)
Eosinophils Absolute: 0.2 10*3/uL (ref 0–0.7)
Eosinophils Relative: 3 %
HEMATOCRIT: 51.4 % (ref 40.0–52.0)
HEMOGLOBIN: 17.1 g/dL (ref 13.0–18.0)
LYMPHS ABS: 1.3 10*3/uL (ref 1.0–3.6)
Lymphocytes Relative: 20 %
MCH: 31.2 pg (ref 26.0–34.0)
MCHC: 33.2 g/dL (ref 32.0–36.0)
MCV: 93.9 fL (ref 80.0–100.0)
MONO ABS: 0.4 10*3/uL (ref 0.2–1.0)
Monocytes Relative: 7 %
NEUTROS ABS: 4.5 10*3/uL (ref 1.4–6.5)
NEUTROS PCT: 69 %
Platelets: 138 10*3/uL — ABNORMAL LOW (ref 150–440)
RBC: 5.48 MIL/uL (ref 4.40–5.90)
RDW: 13.9 % (ref 11.5–14.5)
WBC: 6.4 10*3/uL (ref 3.8–10.6)

## 2015-10-22 LAB — LIPASE, BLOOD: LIPASE: 26 U/L (ref 11–51)

## 2015-10-22 NOTE — ED Provider Notes (Signed)
[facility] Emergency Department Provider Note  ____________________________________________  Time seen: Approximately 3:57 PM  I have reviewed the triage vital signs and the nursing notes.   HISTORY  Chief Complaint Nausea and Chest Pain    HPI Parker [name] is a 82 y.o. male who reports this morning he got up and had some tight squeezing sensation across the center of his chest lasted a few minutes and went away when into his left arm down to the elbow as well. Patient reports this feels exactly the same as what he had when he had his heart attack back in 1995. The pain has not come back nothing he did make it better or worse it went away after some aspirin. Patient has no other complaints. He did not appear to have the pain long enough to have any shortness of breath or sweating etc.   Past Medical History  Diagnosis Date  . MI (myocardial infarction) ([place])   . Hypertension   . Hyperlipidemia   . Bradycardia     per girlfriend, has refused pacemaker   . Chronic systolic heart failure ([place])     35%-45%,  2013  . Head and neck cancer [facility])     surgical resection, no XRT  . Atrial fibrillation [facility])     Patient Active Problem List   Diagnosis Date Noted  . Acute urinary retention 02/23/2015  . Atrial fibrillation ([place]) 02/23/2015  . Benign hypertension 02/23/2015  . Hyperlipidemia LDL goal <70 02/23/2015  . Leukocytosis 02/23/2015  . Urinary retention 02/23/2015  . Pacemaker   . Stroke with cerebral ischemia ([place])   . Acute right MCA stroke ([place]) 02/22/2015  . Acute left hemiparesis ([place]) 02/22/2015  . Hemi-neglect of left side 02/22/2015  . Left homonymous hemianopsia 02/22/2015  . Anosognosia 02/22/2015  . Complete heart block ([place])   . Chronic systolic heart failure ([place])   . 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 Cardiomyopathy ([place]) 02/24/2014  . CAFL (chronic airflow limitation) ([place])  02/24/2014  . Dementia 02/24/2014  . BP (high blood pressure) 02/24/2014  . HLD (hyperlipidemia) 02/24/2014  . Neuropathy ([place]) 02/24/2014    Past Surgical History  Procedure Laterality Date  . Back surgery    . Radiology with anesthesia N/A 02/19/2015    Procedure: RADIOLOGY WITH ANESTHESIA;  Surgeon: Luanne Bras, MD;  Location: [place];  Service: Radiology;  Laterality: N/A;  . Ep implantable device N/A 02/22/2015    Procedure: BiV Pacemaker Insertion CRT-P;  Surgeon: Evans Lance, MD;  Location: [place] [facility];  Service: Cardiovascular;  Laterality: N/A;    Current Outpatient Rx  Name  Route  Sig  Dispense  Refill  . aspirin EC 325 MG EC tablet   Oral   Take 1 tablet (325 mg total) by mouth daily.   30 tablet   0   . atorvastatin (LIPITOR) 40 MG tablet   Oral   Take 1 tablet (40 mg total) by mouth daily at 6 PM.   30 tablet   1   . carvedilol (COREG) 3.125 MG tablet   Oral   Take 1 tablet (3.125 mg total) by mouth 2 (two) times daily with a meal.   60 tablet   1   . diclofenac sodium (VOLTAREN) 1 % GEL   Topical   Apply 2 g topically 3 (three) times daily as needed (shoulder pain).   1 Tube   0   .  lisinopril (PRINIVIL,ZESTRIL) 20 MG tablet   Oral   Take 1 tablet (20 mg total) by mouth at bedtime.   30 tablet   1   . omeprazole (PRILOSEC) 20 MG capsule   Oral   Take 1 capsule (20 mg total) by mouth daily.   30 capsule   1   . polyvinyl alcohol (LIQUIFILM TEARS) 1.4 % ophthalmic solution   Right Eye   Place 2 drops into the right eye 3 (three) times daily.   15 mL   0   . simvastatin (ZOCOR) 20 MG tablet   Oral   Take 1 tablet (20 mg total) by mouth at bedtime.   30 tablet   1   . tamsulosin (FLOMAX) 0.4 MG CAPS capsule   Oral   Take 1 capsule (0.4 mg total) by mouth daily.   30 capsule   1     Allergies Review of patient's allergies indicates no known allergies.  Family History  Problem Relation Age of Onset  . Heart attack  Mother 60  . Cancer Father   . Cancer Paternal Grandfather     Social History Social History  Substance Use Topics  . Smoking status: Former Research scientist (life sciences)  . Smokeless tobacco: None  . Alcohol Use: No    Review of Systems Constitutional: No fever/chills Eyes: No visual changes. ENT: No sore throat. Cardiovascular: See history of present illness Respiratory: Denies shortness of breath. Gastrointestinal: No abdominal pain.  No nausea, no vomiting.  No diarrhea.  No constipation. Genitourinary: Negative for dysuria. Musculoskeletal: Negative for back pain. Skin: Negative for rash.  10-point ROS otherwise negative.  ____________________________________________   PHYSICAL EXAM:  VITAL SIGNS: ED Triage Vitals  Enc Vitals Group     BP 10/22/15 1144 156/98 mmHg     Pulse Rate 10/22/15 1144 70     Resp 10/22/15 1144 16     Temp 10/22/15 1144 97.7 F (36.5 C)     Temp Source 10/22/15 1144 Oral     SpO2 10/22/15 1144 96 %     Weight 10/22/15 1144 185 lb (83.915 kg)     Height 10/22/15 1144 6\' 3"  (1.905 m)     Head Cir --      Peak Flow --      Pain Score --      Pain Loc --      Pain Edu? --      Excl. in [place]? --     Constitutional: Alert and oriented. Well appearing and in no acute distress. Eyes: Conjunctivae are normal. PERRL. EOMI. Head: Atraumatic. Nose: No congestion/rhinnorhea. Mouth/Throat: Mucous membranes are moist.  Oropharynx non-erythematous. Neck: No stridor.   Cardiovascular: Normal rate, regular rhythm. Grossly normal heart sounds.  Good peripheral circulation. Respiratory: Normal respiratory effort.  No retractions. Lungs CTAB! Gastrointestinal: Soft and nontender. No distention. No abdominal bruits. No CVA tenderness. Musculoskeletal: No lower extremity tenderness nor edema.  No joint effusions. Skin:  Skin is warm, dry and intact. No rash noted. Psychiatric: Mood and affect are normal. Speech and behavior are  normal.  ____________________________________________   LABS (all labs ordered are listed, but only abnormal results are displayed)  Labs Reviewed  CBC WITH DIFFERENTIAL/PLATELET - Abnormal; Notable for the following:    Platelets 138 (*)    All other components within normal limits  COMPREHENSIVE METABOLIC PANEL - Abnormal; Notable for the following:    ALT 15 (*)    Total Bilirubin 1.8 (*)    All other components  within normal limits  LIPASE, BLOOD  TROPONIN I  TROPONIN I   ____________________________________________  EKG  EKG read and interpreted by me shows a fully paced rhythm no acute ST-T wave changes. ____________________________________________  RADIOLOGY  Chest x-ray read by radiology reviewed by me shows a reticular nodular pattern developing in the right upper lobe which the radiologist feels may represent early pneumonia. Clinically the lungs are clear there. Patient does have a somewhat worse cough than he did previously. ____________________________________________   PROCEDURES Patient has been pain-free pain only lasted for a few minutes he says. Components of a negative EKG is fully paced I will discharge the patient.  I spoke with Dr. Towanda Malkin and he will inform Dr. Satira Mccallum of the need for follow-up. ____________________________________________   INITIAL IMPRESSION / [place] / ED COURSE  Pertinent labs & imaging results that were available during my care of the patient were reviewed by me and considered in my medical decision making (see chart for details).  ____________________________________________   FINAL CLINICAL IMPRESSION(S) / ED DIAGNOSES  Final diagnoses:  Chest pain, unspecified chest pain type      Nena Polio, MD 10/22/15 1700

## 2015-10-22 NOTE — ED Notes (Signed)
Per EMS, patient comes from home c/o nausea and CP that started around 10am after eating biscuits and gravy. Patient described the pain as a sharp pain that radiated down his left arm. Patients caregiver gave him 324 of ASA and his daily BP medication. Patient denies any nausea or CP at this time. A&O x4. Hx of mild dementia.

## 2015-10-22 NOTE — ED Notes (Signed)
Lab called and notified of add on labs. States they will run them.

## 2015-11-07 ENCOUNTER — Encounter: Payer: Self-pay | Admitting: *Deleted

## 2015-11-26 ENCOUNTER — Emergency Department
Admission: EM | Admit: 2015-11-26 | Discharge: 2015-11-29 | Disposition: E | Payer: Medicare Other | Attending: Emergency Medicine | Admitting: Emergency Medicine

## 2015-11-26 DIAGNOSIS — Z79899 Other long term (current) drug therapy: Secondary | ICD-10-CM | POA: Diagnosis not present

## 2015-11-26 DIAGNOSIS — Z87891 Personal history of nicotine dependence: Secondary | ICD-10-CM | POA: Diagnosis not present

## 2015-11-26 DIAGNOSIS — I10 Essential (primary) hypertension: Secondary | ICD-10-CM | POA: Insufficient documentation

## 2015-11-26 DIAGNOSIS — I469 Cardiac arrest, cause unspecified: Secondary | ICD-10-CM | POA: Diagnosis not present

## 2015-11-26 DIAGNOSIS — Z7982 Long term (current) use of aspirin: Secondary | ICD-10-CM | POA: Diagnosis not present

## 2015-11-26 LAB — GLUCOSE, CAPILLARY: GLUCOSE-CAPILLARY: 86 mg/dL (ref 65–99)

## 2015-11-26 MED ORDER — EPINEPHRINE HCL 0.1 MG/ML IJ SOSY
PREFILLED_SYRINGE | INTRAMUSCULAR | Status: AC | PRN
  Administered 2015-11-26: 1 via INTRAVENOUS

## 2015-11-26 MED ORDER — SODIUM CHLORIDE 0.9 % IV SOLN
INTRAVENOUS | Status: DC | PRN
  Administered 2015-11-26: 1000 mL via INTRAVENOUS

## 2015-11-27 MED ORDER — ONDANSETRON HCL 4 MG/2ML IJ SOLN
INTRAMUSCULAR | Status: AC
  Filled 2015-11-27: qty 2

## 2015-11-27 MED ORDER — FENTANYL CITRATE (PF) 100 MCG/2ML IJ SOLN
INTRAMUSCULAR | Status: AC
  Filled 2015-11-27: qty 2

## 2015-11-28 MED FILL — Medication: Qty: 1 | Status: AC

## 2015-11-29 NOTE — ED Notes (Signed)
Pt arrived via EMS,. CPR in progress

## 2015-11-29 NOTE — Progress Notes (Signed)
Patient cpr room 16. Assist ventilation with ambu bag. Patient intubated via ems with king airway. Code called by MD

## 2015-11-29 NOTE — Code Documentation (Signed)
BG 86, stop compression - [place] check, asystole

## 2015-11-29 NOTE — Code Documentation (Signed)
Patient time of death occurred at 2043/01/18.  MD Paduchowski

## 2015-11-29 NOTE — Code Documentation (Signed)
Family updated as to patient's status by MD [facility]

## 2015-11-29 NOTE — ED Provider Notes (Signed)
[facility] Emergency Department Provider Note  Time seen: 9:10 PM  I have reviewed the triage vital signs and the nursing notes.   HISTORY  Chief Complaint Cardiac Arrest    HPI Parker [name] is a 82 y.o. male with a past medical history of hypertension, hyperlipidemia, myocardial infarction, atrial fibrillation, pacemaker, presents to the emergency department as a cardiac arrest. According to the EMS report, the family states patient was at homeand started acting strangely, somewhat combative. He was lying down to bed and was noted to begin gurgling and then went unresponsive. EMS was called. EMS states patient was pulseless upon arrival, CPR was initiated. Patient was given epinephrine, defibrillated with brief return of pulses, dosed amiodarone, upon loading him into the ambulance he once again lost pulses, Lucas device was attached and CPR was continued until arrival at the emergency department. Initial CPR began around 8 PM. Upon arrival to the emergency department patient is unresponsive, [facility] device performing compressions. King airway in place.     Past Medical History  Diagnosis Date  . MI (myocardial infarction) ([place])   . Hypertension   . Hyperlipidemia   . Bradycardia     per girlfriend, has refused pacemaker   . Chronic systolic heart failure ([place])     35%-45%,  2013  . Head and neck cancer [facility])     surgical resection, no XRT  . Atrial fibrillation [facility])     Patient Active Problem List   Diagnosis Date Noted  . Acute urinary retention 02/23/2015  . Atrial fibrillation ([place]) 02/23/2015  . Benign hypertension 02/23/2015  . Hyperlipidemia LDL goal <70 02/23/2015  . Leukocytosis 02/23/2015  . Urinary retention 02/23/2015  . Pacemaker   . Stroke with cerebral ischemia ([place])   . Acute right MCA stroke ([place]) 02/22/2015  . Acute left hemiparesis ([place]) 02/22/2015  . Hemi-neglect of left side 02/22/2015  . Left homonymous hemianopsia  02/22/2015  . Anosognosia 02/22/2015  . Complete heart block ([place])   . Chronic systolic heart failure ([place])   . 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 Cardiomyopathy ([place]) 02/24/2014  . CAFL (chronic airflow limitation) ([place]) 02/24/2014  . Dementia 02/24/2014  . BP (high blood pressure) 02/24/2014  . HLD (hyperlipidemia) 02/24/2014  . Neuropathy ([place]) 02/24/2014    Past Surgical History  Procedure Laterality Date  . Back surgery    . Radiology with anesthesia N/A 02/19/2015    Procedure: RADIOLOGY WITH ANESTHESIA;  Surgeon: Luanne Bras, MD;  Location: [place];  Service: Radiology;  Laterality: N/A;  . Ep implantable device N/A 02/22/2015    Procedure: BiV Pacemaker Insertion CRT-P;  Surgeon: Evans Lance, MD;  Location: [place];  Service: Cardiovascular;  Laterality: N/A;    Current Outpatient Rx  Name  Route  Sig  Dispense  Refill  . aspirin EC 325 MG EC tablet   Oral   Take 1 tablet (325 mg total) by mouth daily.   30 tablet   0   . atorvastatin (LIPITOR) 40 MG tablet   Oral   Take 1 tablet (40 mg total) by mouth daily at 6 PM.   30 tablet   1   . carvedilol (COREG) 3.125 MG tablet   Oral   Take 1 tablet (3.125 mg total) by mouth 2 (two) times daily with a meal.   60 tablet   1   . diclofenac sodium (VOLTAREN) 1 % GEL  Topical   Apply 2 g topically 3 (three) times daily as needed (shoulder pain).   1 Tube   0   . lisinopril (PRINIVIL,ZESTRIL) 20 MG tablet   Oral   Take 1 tablet (20 mg total) by mouth at bedtime.   30 tablet   1   . omeprazole (PRILOSEC) 20 MG capsule   Oral   Take 1 capsule (20 mg total) by mouth daily.   30 capsule   1   . polyvinyl alcohol (LIQUIFILM TEARS) 1.4 % ophthalmic solution   Right Eye   Place 2 drops into the right eye 3 (three) times daily.   15 mL   0   . simvastatin (ZOCOR) 20 MG tablet   Oral   Take 1 tablet (20 mg total) by mouth at bedtime.   30  tablet   1   . tamsulosin (FLOMAX) 0.4 MG CAPS capsule   Oral   Take 1 capsule (0.4 mg total) by mouth daily.   30 capsule   1     Allergies Review of patient's allergies indicates no known allergies.  Family History  Problem Relation Age of Onset  . Heart attack Mother 40  . Cancer Father   . Cancer Paternal Grandfather     Social History Social History  Substance Use Topics  . Smoking status: Former Research scientist (life sciences)  . Smokeless tobacco: Not on file  . Alcohol Use: No    Review of Systems Unable to obtain a review of systems, cardiac arrest.  ____________________________________________   PHYSICAL EXAM:  Constitutional: Unresponsive. CPR in progress. Eyes: 4 mm fixed and dilated. ENT   Head: Normocephalic and atraumatic.   Mouth/Throat: King airway in place Cardiovascular: CPR performed by [facility] device, pulse with compression palpated. Respiratory: Good air movement bilaterally with ventilations. Gastrointestinal: No apparent trauma, nondistended abdomen. Musculoskeletal: Atraumatic appearing, intraosseous device and lower extremity. Neurologic:  Fixed and dilated pupils, unresponsive. Skin:  Skin is pale  ____________________________________________    EKG  EKG reviewed and interpreted by myself shows pacer spikes without any discernible cardiac electrical activity.  ____________________________________________   INITIAL IMPRESSION / ASSESSMENT AND PLAN / ED COURSE  Pertinent labs & imaging results that were available during my care of the patient were reviewed by me and considered in my medical decision making (see chart for details).  Patient arrived unresponsive, fixed and dilated pupils with [facility] device performing compressions. Lucas device was turned off, no pulse palpated, patient's monitor showed pacer spikes but no obvious cardiac electrical activity. CPR was continued, epinephrine was given. After several rounds of CPR, compressions were held  once again showing pacer spikes on the monitor but no discernible cardiac electrical activity. At this time bedside ultrasound was performed by myself showing cardiac standstill with a good 4 chamber view. As the patient arrived with fixed and dilated pupils and now evidence of cardiac standstill on bedside ultrasound with appropriate for chamber view the decision was made to stop resuscitative efforts. I discussed the outcome and resuscitation with the family, they were very understanding, and appreciative of our efforts.  CRITICAL CARE Performed by: Harvest Dark   Total critical care time: 20 minutes  Critical care time was exclusive of separately billable procedures and treating other patients.  Critical care was necessary to treat or prevent imminent or life-threatening deterioration.  Critical care was time spent personally by me on the following activities: development of treatment plan with patient and/or surrogate as well as nursing, discussions with consultants, evaluation of  patient's response to treatment, examination of patient, obtaining history from patient or surrogate, ordering and performing treatments and interventions, ordering and review of laboratory studies, ordering and review of radiographic studies, pulse oximetry and re-evaluation of patient's condition.   ____________________________________________   FINAL CLINICAL IMPRESSION(S) / ED DIAGNOSES  Cardiac arrest Unsuccessful resuscitation   Harvest Dark, MD 12/13/2015 2123

## 2015-11-29 NOTE — Code Documentation (Signed)
Organ procurement team notified.  [place] donor services called - Parker [name]

## 2015-11-29 DEATH — deceased

## 2017-01-16 IMAGING — MR MR HEAD W/O CM
9 of 12 series · 32 of 48 positions shown · non-contrast
Comparison: CT head 522.  Endovascular procedure [DATE]

CLINICAL DATA: Left-sided paralysis. Recent endovascular procedure.

EXAM:
MRI HEAD WITHOUT CONTRAST
MRA HEAD WITHOUT CONTRAST
TECHNIQUE: Multiplanar, multiecho pulse sequences of the brain and surrounding
structures were obtained without intravenous contrast. Angiographic
images of the head were obtained using MRA technique without
contrast.

[Series 3: (id) mt fs · axial · 1.4mm · 0.43mm/px · z∈[-57,-9]mm · 4 of 154 slices shown]
[im 1/154]
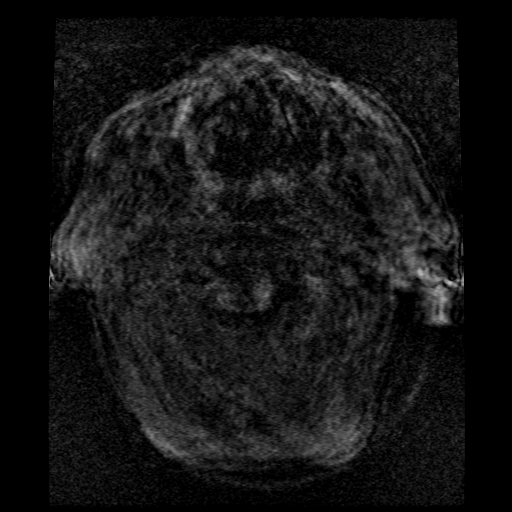
[im 18/154]
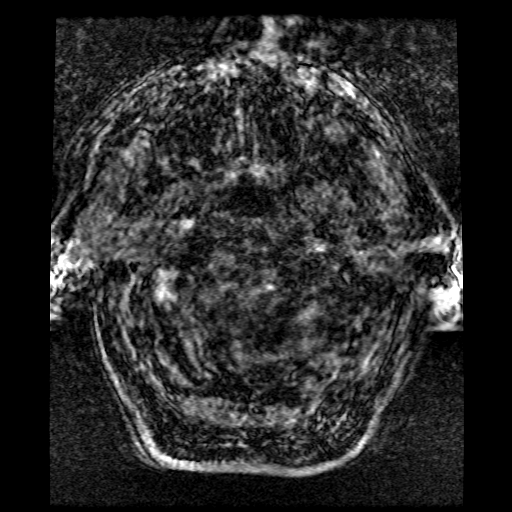
[im 52/154]
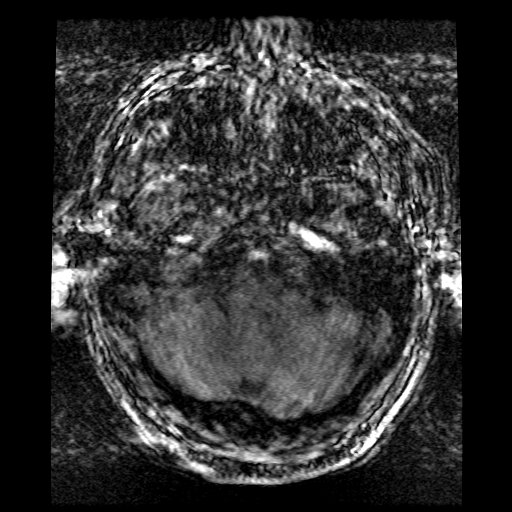
[im 69/154]
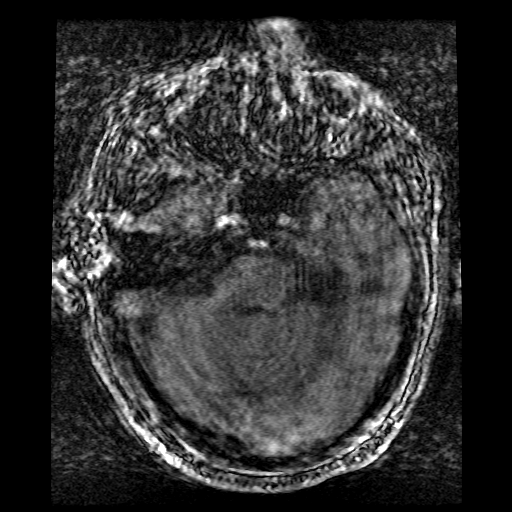

[Series 6: DWI · axial · 3.0mm · 1.09mm/px · z∈[-57,+102]mm · 8 of 108 slices shown (1 of 4)]
[im 1/108]
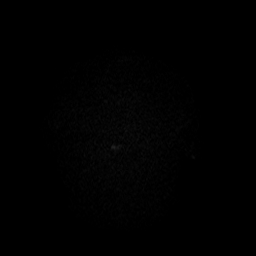
[im 16/108]
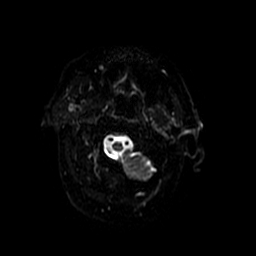
[im 31/108]
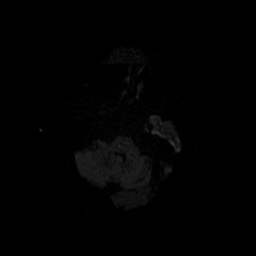
[im 46/108]
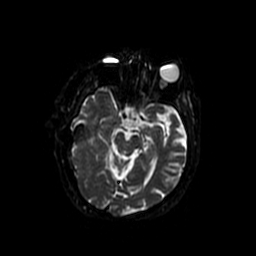
[im 62/108]
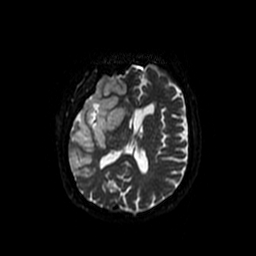
[im 77/108]
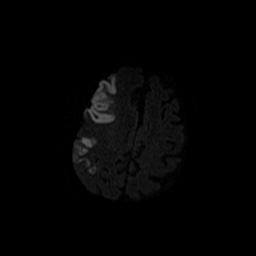
[im 92/108]
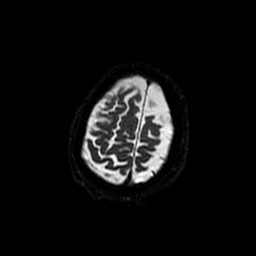
[im 108/108]
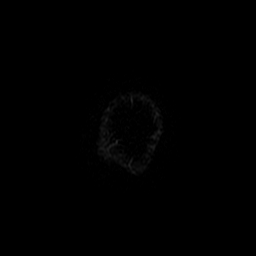

[Series 7: DWI · coronal · 5.0mm · 1.09mm/px · 5 of 74 slices shown (2 of 4)]
[im 1/74]
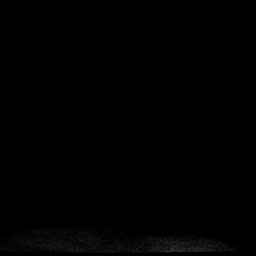
[im 19/74]
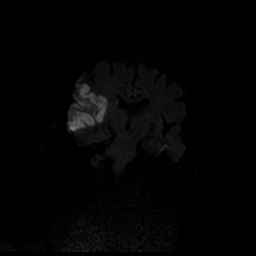
[im 37/74]
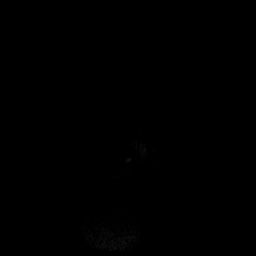
[im 55/74]
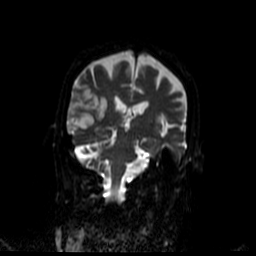
[im 74/74]
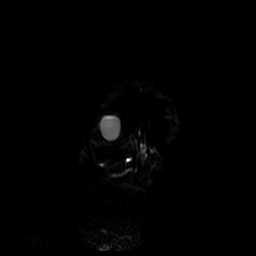

[Series 9: T1 · sagittal · 5.0mm · 0.47mm/px · 2 of 24 slices shown]
[im 1/24]
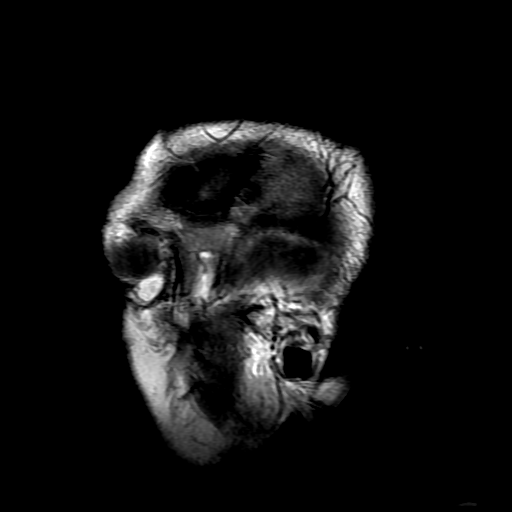
[im 24/24]
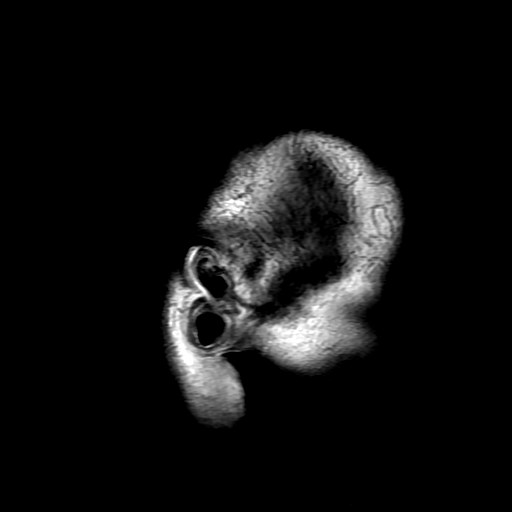

[Series 10: T2 · axial · 5.0mm · 0.43mm/px · z∈[-54,+102]mm · 2 of 27 slices shown (1 of 2)]
[im 1/27]
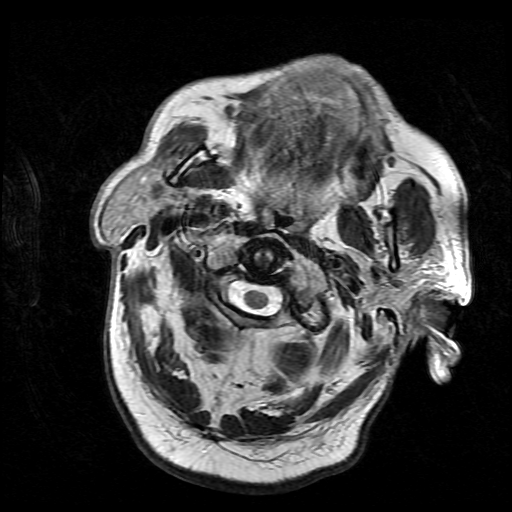
[im 27/27]
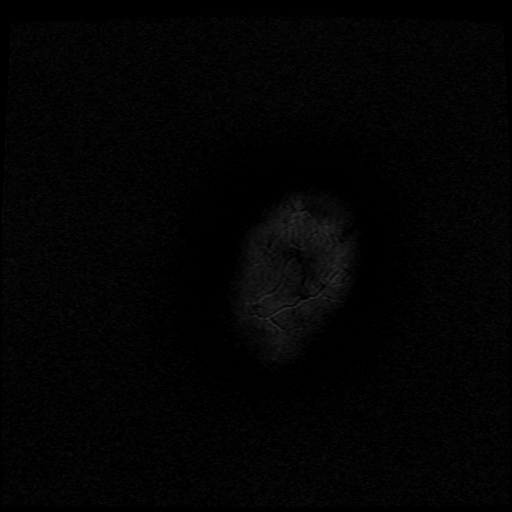

[Series 13: FLAIR · axial · 5.0mm · 0.43mm/px · z∈[-40,+114]mm · 2 of 27 slices shown]
[im 1/27]
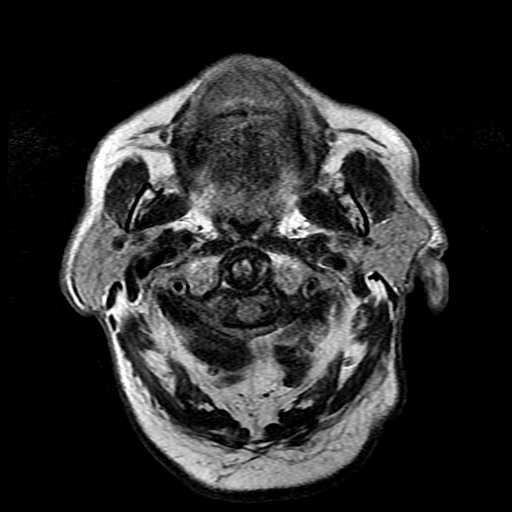
[im 27/27]
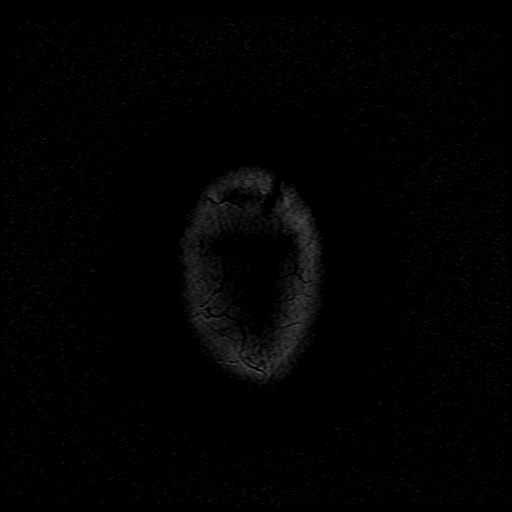

[Series 16: T2 · coronal · 5.0mm · 0.39mm/px · 2 of 28 slices shown (2 of 2)]
[im 1/28]
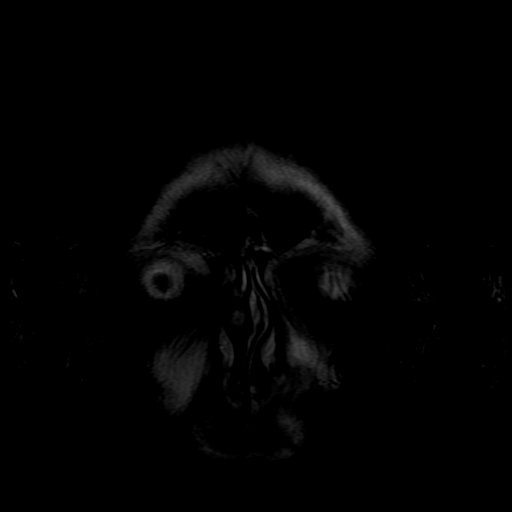
[im 28/28]
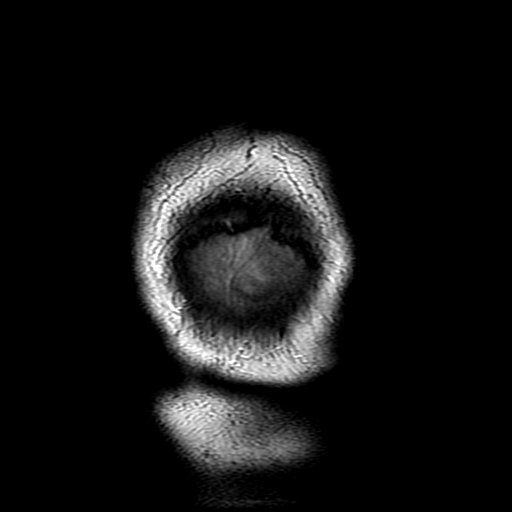

[Series 600: DWI · axial · 3.0mm · 1.09mm/px · z∈[-57,+102]mm · 4 of 54 slices shown (3 of 4)]
[im 1/54]
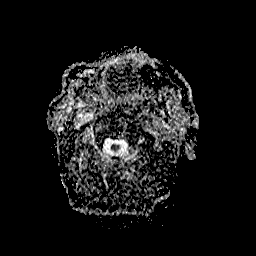
[im 18/54]
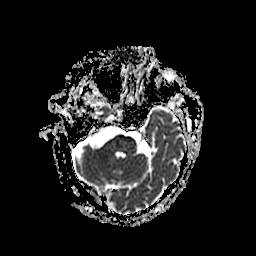
[im 36/54]
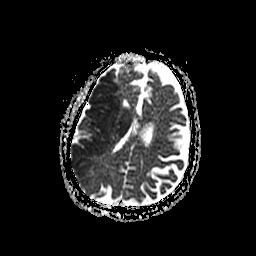
[im 54/54]
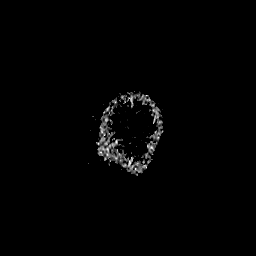

[Series 700: DWI · coronal · 5.0mm · 1.09mm/px · 3 of 37 slices shown (4 of 4)]
[im 1/37]
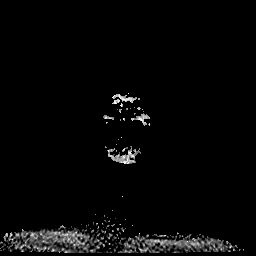
[im 19/37]
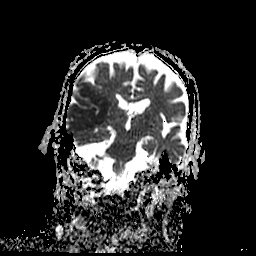
[im 37/37]
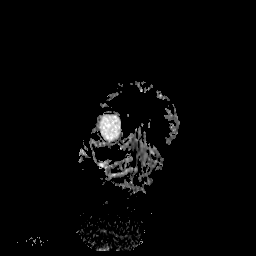

[32 of 48 positions shown; findings below may reference images not displayed]

FINDINGS: MRI HEAD FINDINGS

There is an extensive area of acute infarction affecting most of the
RIGHT middle cerebral artery territory including the frontal,
temporal, and parietal lobes, the insular cortex and subcortical
white matter, as well as the basal ganglia including the caudate and
lentiform nucleus. There is some sparing of the subcortical and
periventricular white matter on the RIGHT. A tiny incidental region
of cortical and subcortical infarction affects the LEFT lobe.

Generalized atrophy. Chronic microvascular ischemic change. No
petechial hemorrhage or foci of chronic hemorrhage. Flow void
patency of the RIGHT ICA and MCA has been re-established. Cavum
septum pellucidum and vergae. Early right-to-left midline shift
estimated 3 mm. No extra-axial fluid collection. No tonsillar or
uncal herniation. Extracranial soft tissues grossly unremarkable.

MRA HEAD FINDINGS

Both internal carotid arteries are patent without flow-limiting
stenosis. Both middle cerebral arteries are patent without
flow-limiting stenosis. Minor proximal oriented LEFT A1 ACA, non
flow reducing. Minor irregularity of the proximal RIGHT M2 branches.
Basilar artery widely patent with vertebrals codominant. Severe
stenosis P2 RIGHT PCA. No cerebellar branch occlusion. No
intracranial aneurysm or vascular occlusion.
IMPRESSION: Extensive area of acute infarction affecting most of the RIGHT
middle cerebral artery territory, including the basal ganglia. No
hemorrhagic transformation.

Tiny incidental area of ischemia LEFT occipital lobe, of no clinical
significance.

Early right-to-left shift of 3 mm.

Patency of the proximal vasculature is been re-established with
endovascular treatment.

## 2017-01-19 IMAGING — DX DG CHEST 2V
2 series · 2 of 2 positions shown · non-contrast
Comparison: 02/20/2015

CLINICAL DATA: Status post pacemaker placement

EXAM:
CHEST  2 VIEW

[chest lat]
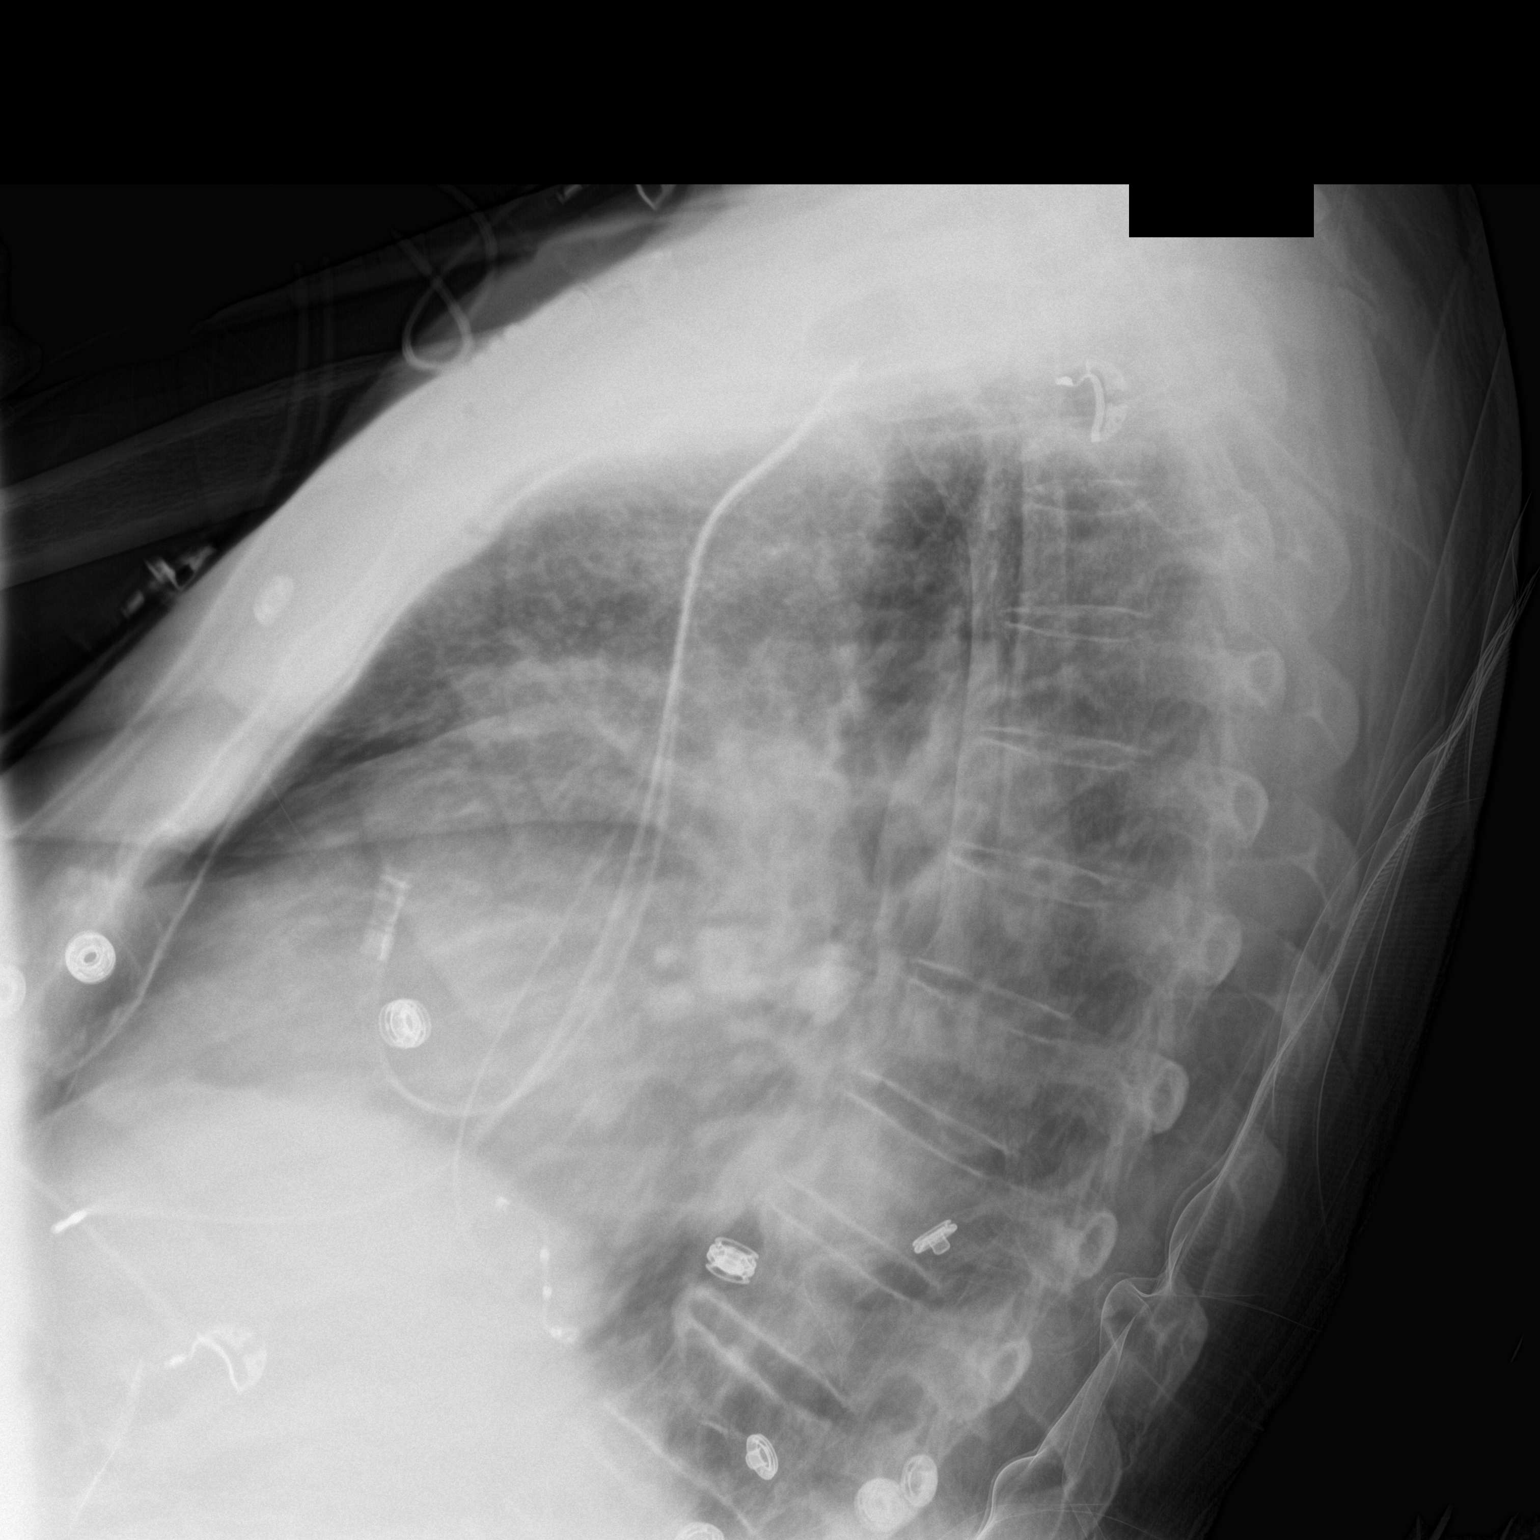

[chest ap]
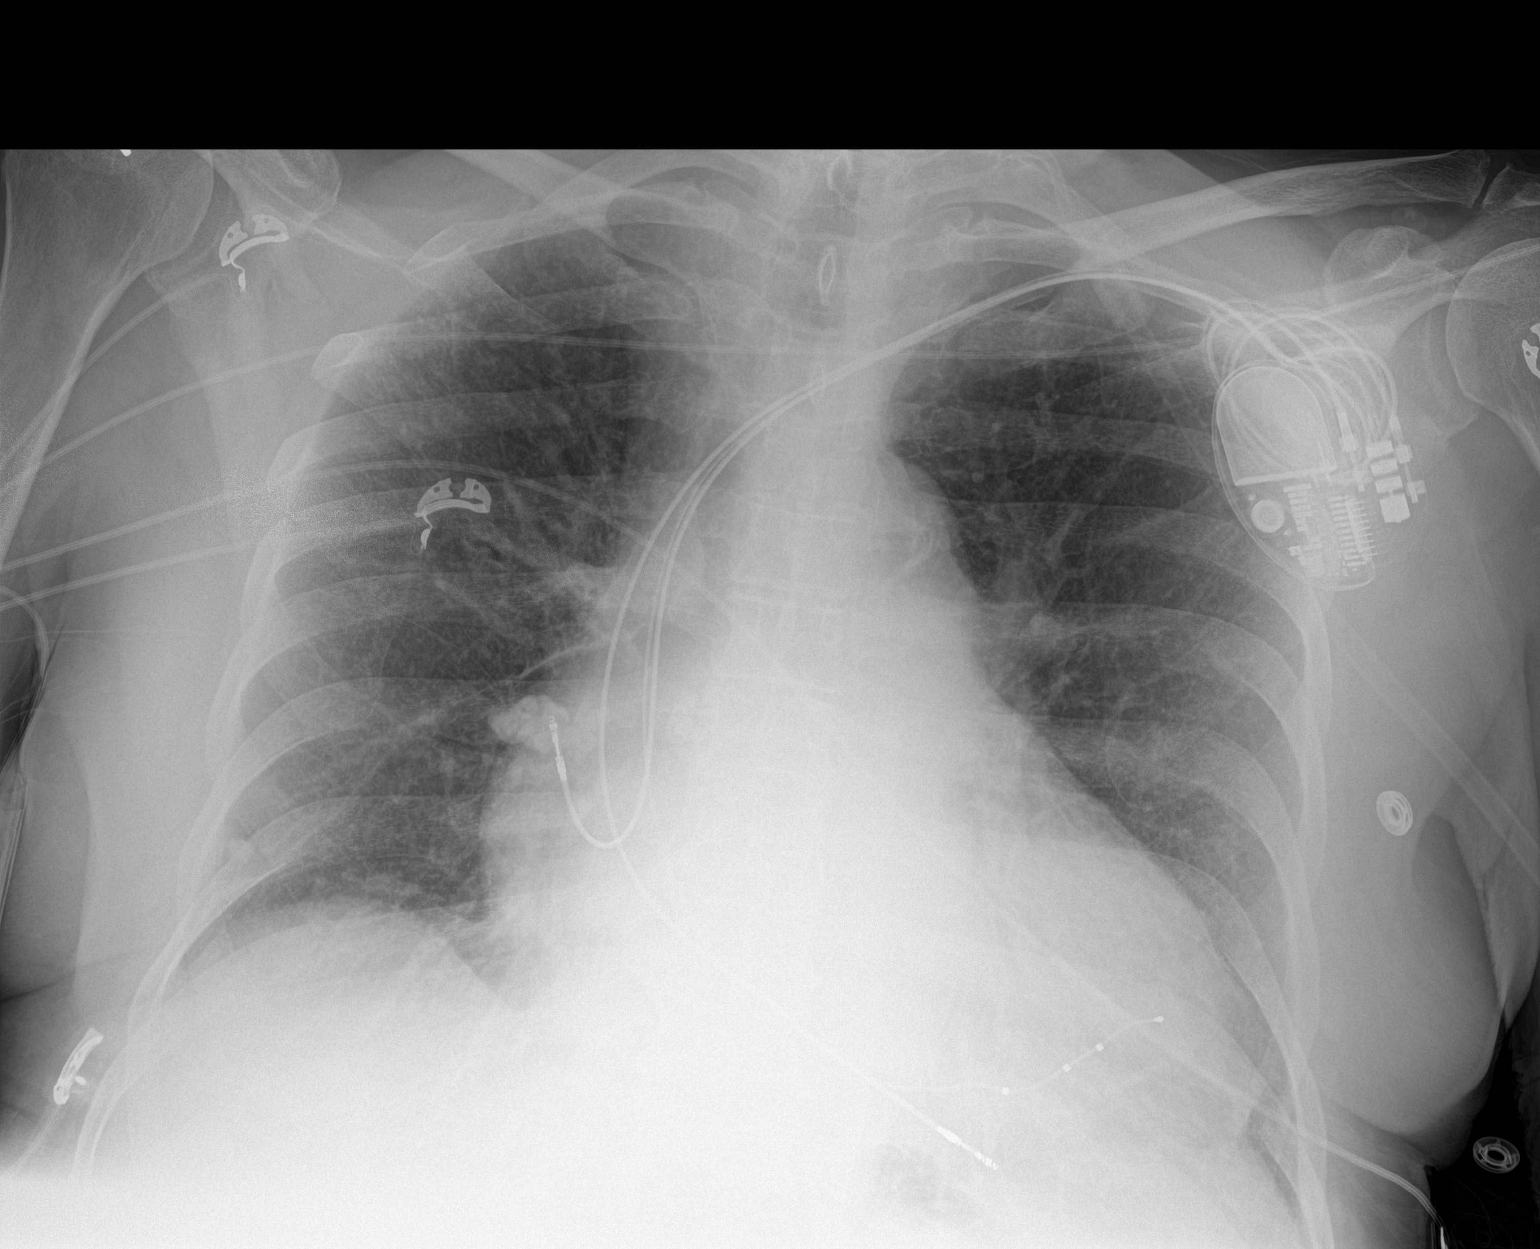

[2 of 2 positions shown; findings below may reference images not displayed]

FINDINGS: Pacing device is now seen on the left. No pneumothorax is seen.
Cardiac shadow remains enlarged. Changes of prior granulomatous
disease are again noted. The endotracheal tube and nasogastric
catheter have been removed in the interval. No focal infiltrate is
seen.
IMPRESSION: No acute abnormality noted.
